# Patient Record
Sex: Female | Born: 1955 | Race: Black or African American | Hispanic: No | Marital: Married | Smoking: Former smoker
Health system: Southern US, Community
[De-identification: ages and names within clinical notes are randomized; demographics above are authoritative.]

## PROBLEM LIST (undated history)

## (undated) DIAGNOSIS — D649 Anemia, unspecified: Secondary | ICD-10-CM

## (undated) DIAGNOSIS — I10 Essential (primary) hypertension: Secondary | ICD-10-CM

## (undated) DIAGNOSIS — M1990 Unspecified osteoarthritis, unspecified site: Secondary | ICD-10-CM

## (undated) DIAGNOSIS — Z923 Personal history of irradiation: Secondary | ICD-10-CM

## (undated) DIAGNOSIS — K219 Gastro-esophageal reflux disease without esophagitis: Secondary | ICD-10-CM

## (undated) DIAGNOSIS — E785 Hyperlipidemia, unspecified: Secondary | ICD-10-CM

## (undated) DIAGNOSIS — C50919 Malignant neoplasm of unspecified site of unspecified female breast: Secondary | ICD-10-CM

## (undated) HISTORY — PX: TUBAL LIGATION: SHX77

## (undated) HISTORY — DX: Essential (primary) hypertension: I10

## (undated) HISTORY — DX: Hyperlipidemia, unspecified: E78.5

## (undated) HISTORY — PX: OTHER SURGICAL HISTORY: SHX169

## (undated) HISTORY — DX: Malignant neoplasm of unspecified site of unspecified female breast: C50.919

---

## 2004-05-08 ENCOUNTER — Emergency Department: Payer: Self-pay | Admitting: Internal Medicine

## 2004-06-20 ENCOUNTER — Emergency Department: Payer: Self-pay | Admitting: Emergency Medicine

## 2005-01-02 ENCOUNTER — Emergency Department: Payer: Self-pay | Admitting: Unknown Physician Specialty

## 2005-01-02 ENCOUNTER — Other Ambulatory Visit: Payer: Self-pay

## 2007-09-16 ENCOUNTER — Ambulatory Visit: Payer: Self-pay

## 2011-05-01 ENCOUNTER — Ambulatory Visit: Payer: Self-pay | Admitting: Internal Medicine

## 2011-10-02 ENCOUNTER — Ambulatory Visit: Payer: Self-pay | Admitting: Family

## 2012-03-04 ENCOUNTER — Ambulatory Visit: Payer: Self-pay | Admitting: Adult Health

## 2012-03-11 ENCOUNTER — Ambulatory Visit: Payer: Self-pay | Admitting: Adult Health

## 2012-05-13 ENCOUNTER — Encounter: Payer: Self-pay | Admitting: Adult Health

## 2012-05-13 ENCOUNTER — Ambulatory Visit (INDEPENDENT_AMBULATORY_CARE_PROVIDER_SITE_OTHER): Payer: 59 | Admitting: Adult Health

## 2012-05-13 VITALS — BP 190/110 | HR 70 | Temp 98.5°F | Ht 70.5 in | Wt 222.0 lb

## 2012-05-13 DIAGNOSIS — Z0000 Encounter for general adult medical examination without abnormal findings: Secondary | ICD-10-CM | POA: Insufficient documentation

## 2012-05-13 DIAGNOSIS — G629 Polyneuropathy, unspecified: Secondary | ICD-10-CM

## 2012-05-13 DIAGNOSIS — G589 Mononeuropathy, unspecified: Secondary | ICD-10-CM

## 2012-05-13 DIAGNOSIS — I10 Essential (primary) hypertension: Secondary | ICD-10-CM | POA: Insufficient documentation

## 2012-05-13 DIAGNOSIS — E785 Hyperlipidemia, unspecified: Secondary | ICD-10-CM | POA: Insufficient documentation

## 2012-05-13 DIAGNOSIS — Z1211 Encounter for screening for malignant neoplasm of colon: Secondary | ICD-10-CM

## 2012-05-13 DIAGNOSIS — R5381 Other malaise: Secondary | ICD-10-CM

## 2012-05-13 DIAGNOSIS — R5383 Other fatigue: Secondary | ICD-10-CM

## 2012-05-13 DIAGNOSIS — Z23 Encounter for immunization: Secondary | ICD-10-CM

## 2012-05-13 NOTE — Assessment & Plan Note (Signed)
Patient has not taken her medication today. Suspect it is elevated 2/2 white coat syndrome and not taking meds. She will monitor this and we will adjust meds as needed.

## 2012-05-13 NOTE — Assessment & Plan Note (Addendum)
Currently on simvastatin 40 mg Will check lipid panel. Patient will return for fasting labs.

## 2012-05-13 NOTE — Patient Instructions (Addendum)
  Thank you for choosing [facility] for your health care needs.  You will get your Tetanus and Shingles vaccine today.  We will call you with your appointment for the screening colonoscopy.  Remember to return on Monday for your fasting labs. Please schedule appointment for this at the front.  Remember to schedule an appointment for your PAP.  You have enough refills on your medications for now. Please let me know when you are down to 2 refills.

## 2012-05-13 NOTE — Progress Notes (Signed)
  Subjective:    Patient ID: Olivia Kirk, female    DOB: 1955/05/21, 57 y.o.   MRN: [id number]  HPI  Patient is a 57 y/o female who is here today to establish care. She has a hx of HTN, HLD. She was previously followed at [facility]. She is feeling quite well today.   Health Maintenance:  Mammogram - 2013 - [facility] (normal)  Pap - greater than 3 years ago.   Tobacco use - Remote 5 year 1/2 ppd hx - quit 25 years ago.   Tdap - Needs - will administer today  Shingles - Needs   Influenza vaccine - Has not had this season.  Colon Cancer Screen - Needs  Diet - Tries to eat healthy. Eats vegetables not enough fruits  Exercise - not currently  Eye exam - Needs will schedule this year.  Dental exam - Upper/lower denture. Has not been to dentist in approximately 2 years.    Review of Systems  Constitutional: Positive for fatigue. Negative for fever, chills and unexpected weight change.  Eyes: Negative.   Respiratory: Negative for cough, chest tightness and wheezing.   Cardiovascular: Positive for palpitations and leg swelling. Negative for chest pain.  Gastrointestinal: Positive for constipation. Negative for nausea, vomiting, abdominal pain, diarrhea, blood in stool and anal bleeding.  Endocrine: Negative.   Genitourinary: Negative for dysuria, urgency, hematuria, flank pain, decreased urine volume, vaginal bleeding, vaginal discharge, difficulty urinating, genital sores, pelvic pain and dyspareunia.  Allergic/Immunologic: Negative for environmental allergies and food allergies.  Neurological: Positive for numbness and headaches. Negative for dizziness, tremors, syncope and light-headedness.       Occasional numbness and tingling in hands and feet.  Hematological: Does not bruise/bleed easily.  Psychiatric/Behavioral: Positive for sleep disturbance. Negative for suicidal ideas and confusion. The patient is nervous/anxious.        Sleep disturbance 2/2 starts to  think about her family, etc. Thoughts just "keep coming".       Objective:   Physical Exam  Constitutional: She is oriented to person, place, and time. She appears well-developed and well-nourished. No distress.  HENT:  Head: Normocephalic and atraumatic.  Right Ear: External ear normal.  Left Ear: External ear normal.  Nose: Nose normal.  Mouth/Throat: Oropharynx is clear and moist. No oropharyngeal exudate.  Eyes: Conjunctivae and EOM are normal. Pupils are equal, round, and reactive to light. Right eye exhibits no discharge. Left eye exhibits no discharge.  Neck: Normal range of motion. Neck supple. No tracheal deviation present. No thyromegaly present.  Cardiovascular: Normal rate, regular rhythm, normal heart sounds and intact distal pulses.  Exam reveals no gallop.   No murmur heard. Pulmonary/Chest: Effort normal and breath sounds normal. No respiratory distress. She has no wheezes. She has no rales.  Abdominal: Soft. Bowel sounds are normal. She exhibits no distension and no mass. There is no tenderness. There is no rebound and no guarding.  Musculoskeletal: Normal range of motion.  Trace bilateral ankle edema  Lymphadenopathy:    She has no cervical adenopathy.  Neurological: She is alert and oriented to person, place, and time. She has normal reflexes. Coordination normal.  Skin: Skin is warm and dry. No rash noted. No erythema. No pallor.  Psychiatric: She has a normal mood and affect. Her behavior is normal. Judgment and thought content normal.       Assessment & Plan:

## 2012-05-13 NOTE — Assessment & Plan Note (Addendum)
Physical exam normal. B/P elevated (see note below). Will check labs: cbc w/diff, cmet, lipids, TSH, B12. Refer to GI for screening colonoscopy. Give Tdap and shingles vaccines today. She wants to hold off on flu vaccine. Pt will return on Monday morning for fasting labs. Needs to schedule appointment for pelvic including PAP. Patient reporting mild fatigue - check TSH. Also reports tingling and numbness in feet and hands - check B12.

## 2012-05-20 ENCOUNTER — Other Ambulatory Visit (INDEPENDENT_AMBULATORY_CARE_PROVIDER_SITE_OTHER): Payer: 59

## 2012-05-20 DIAGNOSIS — G589 Mononeuropathy, unspecified: Secondary | ICD-10-CM

## 2012-05-20 DIAGNOSIS — R5381 Other malaise: Secondary | ICD-10-CM

## 2012-05-20 DIAGNOSIS — Z0000 Encounter for general adult medical examination without abnormal findings: Secondary | ICD-10-CM

## 2012-05-20 DIAGNOSIS — R5383 Other fatigue: Secondary | ICD-10-CM

## 2012-05-20 DIAGNOSIS — G629 Polyneuropathy, unspecified: Secondary | ICD-10-CM

## 2012-05-20 LAB — COMPREHENSIVE METABOLIC PANEL
ALT: 16 U/L (ref 0–35)
Albumin: 4 g/dL (ref 3.5–5.2)
CO2: 29 mEq/L (ref 19–32)
Calcium: 9.7 mg/dL (ref 8.4–10.5)
Chloride: 103 mEq/L (ref 96–112)
GFR: 85.04 mL/min (ref >60.00)
Glucose, Bld: 95 mg/dL (ref 70–99)
Potassium: 3.6 mEq/L (ref 3.5–5.1)
Sodium: 140 mEq/L (ref 135–145)
Total Bilirubin: 0.6 mg/dL (ref 0.3–1.2)
Total Protein: 7.3 g/dL (ref 6.0–8.3)

## 2012-05-20 LAB — CBC WITH DIFFERENTIAL/PLATELET
Basophils Absolute: 0 10*3/uL (ref 0.0–0.1)
Eosinophils Relative: 1.2 % (ref 0.0–5.0)
HCT: 35.5 % — ABNORMAL LOW (ref 36.0–46.0)
Lymphocytes Relative: 40 % (ref 12.0–46.0)
Monocytes Relative: 6.3 % (ref 3.0–12.0)
Platelets: 265 10*3/uL (ref 150.0–400.0)
RDW: 13.4 % (ref 11.5–14.6)
WBC: 6.8 10*3/uL (ref 4.5–10.5)

## 2012-05-20 LAB — VITAMIN B12: Vitamin B-12: 436 pg/mL (ref 211–911)

## 2012-05-20 LAB — LDL CHOLESTEROL, DIRECT: Direct LDL: 106.2 mg/dL

## 2012-05-20 LAB — LIPID PANEL
Cholesterol: 171 mg/dL (ref 0–200)
VLDL: 25.4 mg/dL (ref 0.0–40.0)

## 2012-05-20 LAB — TSH: TSH: 0.74 u[IU]/mL (ref 0.35–5.50)

## 2012-05-22 ENCOUNTER — Other Ambulatory Visit: Payer: Self-pay | Admitting: Adult Health

## 2012-05-22 DIAGNOSIS — D649 Anemia, unspecified: Secondary | ICD-10-CM

## 2012-05-27 ENCOUNTER — Telehealth: Payer: Self-pay | Admitting: Adult Health

## 2012-05-27 NOTE — Telephone Encounter (Signed)
Megan calling from Dr. Earnest Conroy office regarding colonoscopy referral from [facility].  Per Dr. Markham Jordan pt needs EKG.  Pt has appt 3/31 with [facility]; can EKG be added to that visit or need to schedule separate appt for EKG.  Need EKG faxed to Dr. Mechele Collin fax [phone] ATTN:  Aundra Millet when complete.

## 2012-05-27 NOTE — Telephone Encounter (Signed)
FYI only Raquel

## 2012-05-31 ENCOUNTER — Encounter: Payer: Self-pay | Admitting: *Deleted

## 2012-06-03 LAB — HM COLONOSCOPY: HM Colonoscopy: NORMAL

## 2012-06-17 ENCOUNTER — Other Ambulatory Visit (HOSPITAL_COMMUNITY)
Admission: RE | Admit: 2012-06-17 | Discharge: 2012-06-17 | Disposition: A | Discharge status: Home / Self Care | Payer: 59 | Source: Ambulatory Visit | Attending: Adult Health | Admitting: Adult Health

## 2012-06-17 ENCOUNTER — Ambulatory Visit (INDEPENDENT_AMBULATORY_CARE_PROVIDER_SITE_OTHER): Payer: 59 | Admitting: Adult Health

## 2012-06-17 ENCOUNTER — Encounter: Payer: Self-pay | Admitting: Adult Health

## 2012-06-17 VITALS — BP 132/88 | HR 67 | Temp 98.0°F | Resp 14 | Ht 71.5 in | Wt 220.0 lb

## 2012-06-17 DIAGNOSIS — Z01419 Encounter for gynecological examination (general) (routine) without abnormal findings: Secondary | ICD-10-CM | POA: Insufficient documentation

## 2012-06-17 DIAGNOSIS — Z1151 Encounter for screening for human papillomavirus (HPV): Secondary | ICD-10-CM | POA: Insufficient documentation

## 2012-06-17 DIAGNOSIS — Z1211 Encounter for screening for malignant neoplasm of colon: Secondary | ICD-10-CM | POA: Insufficient documentation

## 2012-06-17 DIAGNOSIS — R9431 Abnormal electrocardiogram [ECG] [EKG]: Secondary | ICD-10-CM

## 2012-06-17 DIAGNOSIS — I10 Essential (primary) hypertension: Secondary | ICD-10-CM

## 2012-06-17 DIAGNOSIS — Z124 Encounter for screening for malignant neoplasm of cervix: Secondary | ICD-10-CM

## 2012-06-17 DIAGNOSIS — Z1239 Encounter for other screening for malignant neoplasm of breast: Secondary | ICD-10-CM

## 2012-06-17 DIAGNOSIS — Z0000 Encounter for general adult medical examination without abnormal findings: Secondary | ICD-10-CM

## 2012-06-17 NOTE — Patient Instructions (Addendum)
  Your appointment with Dr. Mechele Collin for the screening colonoscopy is 08/19/12 at 11:00 am at [facility]  I am referring you to cardiology to evaluate the EKG.

## 2012-06-17 NOTE — Progress Notes (Signed)
  Subjective:    Patient ID: Olivia Kirk, female    DOB: [birth year], 57 y.o.   MRN: [id number]  HPI  Patient is a pleasant 57 year old female with hx of HTN, HLD who presents to clinic for her pelvic exam including Pap, breast exam and EKG prior to colonoscopy. She is also here for followup hypertension. Patient is feeling well overall.   Current Outpatient Prescriptions on File Prior to Visit  Medication Sig Dispense Refill  . hydrochlorothiazide (HYDRODIURIL) 25 MG tablet Take 25 mg by mouth daily.      . metoprolol (LOPRESSOR) 50 MG tablet Take 50 mg by mouth 2 (two) times daily.      . metoprolol succinate (TOPROL-XL) 100 MG 24 hr tablet Take 100 mg by mouth daily. Take with or immediately following a meal.      . simvastatin (ZOCOR) 40 MG tablet Take 40 mg by mouth every evening.       No current facility-administered medications on file prior to visit.    Review of Systems  Constitutional: Negative for fever, chills and fatigue.  Respiratory: Negative.   Cardiovascular: Negative.   Gastrointestinal: Negative.   Genitourinary: Negative for vaginal bleeding, vaginal discharge, difficulty urinating and pelvic pain.  Musculoskeletal: Negative.   Neurological: Negative for dizziness, syncope, weakness, numbness and headaches.  Psychiatric/Behavioral: Negative.    BP 132/88  Pulse 67  Temp(Src) 98 F (36.7 C) (Oral)  Resp 14  Ht 5' 11.5" (1.816 m)  Wt 220 lb (99.791 kg)  BMI 30.26 kg/m2  SpO2 95%     Objective:   Physical Exam  Constitutional: She is oriented to person, place, and time. She appears well-developed and well-nourished. No distress.  Cardiovascular: Normal rate, regular rhythm and normal heart sounds.  Exam reveals no gallop.   No murmur heard. Pulmonary/Chest: Effort normal and breath sounds normal. No respiratory distress.  Abdominal: Soft. Bowel sounds are normal. She exhibits no mass. There is no tenderness.  Genitourinary: Vagina normal and uterus  normal. Guaiac negative stool. No breast swelling, tenderness, discharge or bleeding. No labial fusion. There is no rash, tenderness, lesion or injury on the right labia. There is no rash, tenderness, lesion or injury on the left labia. No erythema, tenderness or bleeding around the vagina. No vaginal discharge found.  Musculoskeletal: Normal range of motion. She exhibits no edema.  Neurological: She is alert and oriented to person, place, and time.  Skin: Skin is warm and dry.  Psychiatric: She has a normal mood and affect. Her behavior is normal. Judgment and thought content normal.       Assessment & Plan:

## 2012-06-17 NOTE — Assessment & Plan Note (Signed)
Normal breast tissue. No hard immovable masses palpated. No nipple drainage or dimpling. Patient will have yearly mammography at [facility]

## 2012-06-17 NOTE — Assessment & Plan Note (Signed)
Patient has been taking her medication and is better controlled than during last visit. She denies any problems with side effects of her medications.

## 2012-06-17 NOTE — Assessment & Plan Note (Signed)
Pap smear and pelvic exam completed. Normal physical exam. The ovaries are nonpalpable. No adnexal masses. No cystocele or rectocele appreciated. Hemoccult negative.

## 2012-06-17 NOTE — Addendum Note (Signed)
Addended by: Montine Circle D on: 06/17/2012 04:14 PM   Modules accepted: Orders

## 2012-06-20 ENCOUNTER — Ambulatory Visit (INDEPENDENT_AMBULATORY_CARE_PROVIDER_SITE_OTHER): Payer: 59 | Admitting: Cardiovascular Disease

## 2012-06-20 ENCOUNTER — Encounter: Payer: Self-pay | Admitting: Cardiovascular Disease

## 2012-06-20 VITALS — BP 140/90 | HR 59 | Ht 72.0 in | Wt 221.8 lb

## 2012-06-20 DIAGNOSIS — R9431 Abnormal electrocardiogram [ECG] [EKG]: Secondary | ICD-10-CM | POA: Insufficient documentation

## 2012-06-20 DIAGNOSIS — R0789 Other chest pain: Secondary | ICD-10-CM

## 2012-06-20 DIAGNOSIS — R0602 Shortness of breath: Secondary | ICD-10-CM

## 2012-06-20 NOTE — Progress Notes (Signed)
HPI  This is a pleasant 57 year old [place] American female who was referred for evaluation of an abnormal ECG. She is not aware of any previous cardiac history. She has known history of hypertension and hyperlipidemia. Otherwise she's been healthy. She denies any exertional chest pain. She has occasional mild palpitations and dyspnea but not on a regular basis. She has no limitation with physical activities. She also complains of mild lower extremity edema worse on the left side. She reports having a stress test many years ago for atypical symptoms and she was told that everything was fine. She is not a smoker and has no family history of premature coronary artery disease. There is family history of hypertension.  Allergies  Allergen Reactions  . Guaifenesin & Derivatives Itching, Dermatitis and Rash     Current Outpatient Prescriptions on File Prior to Visit  Medication Sig Dispense Refill  . hydrochlorothiazide (HYDRODIURIL) 25 MG tablet Take 25 mg by mouth daily.      . metoprolol (LOPRESSOR) 50 MG tablet Take 50 mg by mouth 2 (two) times daily.      . metoprolol succinate (TOPROL-XL) 100 MG 24 hr tablet Take 100 mg by mouth daily. Take with or immediately following a meal.      . simvastatin (ZOCOR) 40 MG tablet Take 40 mg by mouth every evening.       No current facility-administered medications on file prior to visit.     Past Medical History  Diagnosis Date  . Hypertension   . Hyperlipidemia      Past Surgical History  Procedure Laterality Date  . Tubal ligation    . Hemrrhoid       Family History  Problem Relation Age of Onset  . Hypertension Mother   . Cancer Father   . Heart disease Father   . Hypertension Maternal Grandmother   . Stroke Maternal Grandmother      History   Social History  . Marital Status: Married    Spouse Name: Moise Boring [place]    Number of Children: 3  . Years of Education: 12   Occupational History  . [employer] research associate in [place]  . Cleaning services     [employer]   Social History Main Topics  . Smoking status: Former Smoker -- 0.25 packs/day    Types: Cigarettes    Quit date: 05/07/1987  . Smokeless tobacco: Never Used  . Alcohol Use: 0.0 oz/week    0 Glasses of wine per week     Comment: Occassionally  . Drug Use: No  . Sexually Active: Not on file   Other Topics Concern  . Not on file   Social History Narrative      Patient lives at home with her husband of 33 years. They have 3 adult children. Recently, one of their daughters moved in with them with her 3 small children. She has a remote tobacco hx. She quite approximately 25 years ago. She has two jobs - she works for [employer] in [place] and also for [employer].     ROS Constitutional: Negative for fever, chills, diaphoresis, activity change, appetite change and fatigue.  HENT: Negative for hearing loss, nosebleeds, congestion, sore throat, facial swelling, drooling, trouble swallowing, neck pain, voice change, sinus pressure and tinnitus.  Eyes: Negative for photophobia, pain, discharge and visual disturbance.  Respiratory: Negative for apnea, cough, shortness of breath and wheezing.  Cardiovascular: Negative for chest pain. Gastrointestinal: Negative for nausea, vomiting, abdominal  pain, diarrhea, constipation, blood in stool and abdominal distention.  Genitourinary: Negative for dysuria, urgency, frequency, hematuria and decreased urine volume.  Musculoskeletal: Negative for myalgias, back pain, joint swelling, arthralgias and gait problem.  Skin: Negative for color change, pallor, rash and wound.  Neurological: Negative for dizziness, tremors, seizures, syncope, speech difficulty, weakness, light-headedness, numbness and headaches.  Psychiatric/Behavioral: Negative for suicidal ideas, hallucinations, behavioral problems and agitation. The patient is not nervous/anxious.     PHYSICAL EXAM   BP 140/90   Pulse 59  Ht 6' (1.829 m)  Wt 221 lb 12 oz (100.585 kg)  BMI 30.07 kg/m2 Constitutional: She is oriented to person, place, and time. She appears well-developed and well-nourished. No distress.  HENT: No nasal discharge.  Head: Normocephalic and atraumatic.  Eyes: Pupils are equal and round. Right eye exhibits no discharge. Left eye exhibits no discharge.  Neck: Normal range of motion. Neck supple. No JVD present. No thyromegaly present.  Cardiovascular: Normal rate, regular rhythm, normal heart sounds. Exam reveals no gallop and no friction rub. No murmur heard.  Pulmonary/Chest: Effort normal and breath sounds normal. No stridor. No respiratory distress. She has no wheezes. She has no rales. She exhibits no tenderness.  Abdominal: Soft. Bowel sounds are normal. She exhibits no distension. There is no tenderness. There is no rebound and no guarding.  Musculoskeletal: Normal range of motion. She exhibits no edema and no tenderness.  Neurological: She is alert and oriented to person, place, and time. Coordination normal.  Skin: Skin is warm and dry. No rash noted. She is not diaphoretic. No erythema. No pallor.  Psychiatric: She has a normal mood and affect. Her behavior is normal. Judgment and thought content normal.     ZOX:[id number]  Bradycardia  Nonspecific IVCD. QRS duration: 102 ms Borderline ECG   ASSESSMENT AND PLAN

## 2012-06-20 NOTE — Patient Instructions (Addendum)
Your physician has requested that you have an echocardiogram. Echocardiography is a painless test that uses sound waves to create images of your heart. It provides your doctor with information about the size and shape of your heart and how well your heart's chambers and valves are working. This procedure takes approximately one hour. There are no restrictions for this procedure.  Follow up as needed 

## 2012-06-20 NOTE — Assessment & Plan Note (Signed)
There is borderline nonspecific IVCD on the EKG which I suspect is likely due to mild hypertensive heart disease. I don't see any signs of ischemia or other cardiac pathology. She has no chest pain but does complain of occasional shortness of breath and palpitations. Thus, I will obtain an echocardiogram for evaluation. Overall, she was reassured. I  recommend controlling her risk factors including hypertension and hyperlipidemia.

## 2012-06-26 ENCOUNTER — Encounter: Payer: Self-pay | Admitting: General Practice

## 2012-07-11 ENCOUNTER — Other Ambulatory Visit (INDEPENDENT_AMBULATORY_CARE_PROVIDER_SITE_OTHER): Payer: 59

## 2012-07-11 ENCOUNTER — Other Ambulatory Visit: Payer: Self-pay

## 2012-07-11 DIAGNOSIS — R9431 Abnormal electrocardiogram [ECG] [EKG]: Secondary | ICD-10-CM

## 2012-07-11 DIAGNOSIS — R0789 Other chest pain: Secondary | ICD-10-CM

## 2012-07-11 DIAGNOSIS — R0602 Shortness of breath: Secondary | ICD-10-CM

## 2012-07-15 NOTE — Progress Notes (Signed)
Pt informed of echo results.

## 2012-10-02 ENCOUNTER — Ambulatory Visit: Payer: Self-pay | Admitting: Internal Medicine

## 2012-10-24 ENCOUNTER — Encounter: Payer: Self-pay | Admitting: Internal Medicine

## 2013-03-31 ENCOUNTER — Telehealth: Payer: Self-pay | Admitting: *Deleted

## 2013-03-31 NOTE — Telephone Encounter (Signed)
Patient walked in c/o numbness in both arms yesterday while she was in [employer]. Denies any chest pain or discomfort, no shortness of breath or any other symptoms. It only happened on this one occasion in church yesterday and has not happened again. Informed patient to schedule an appointment to be seen. Also stated she is having more knee pain, the pain is more constant and feels like a sharp stabbing right there where she has a broken vein. If any other suggestions please call patient.

## 2013-04-01 ENCOUNTER — Encounter: Payer: Self-pay | Admitting: Adult Health

## 2013-04-01 ENCOUNTER — Ambulatory Visit (INDEPENDENT_AMBULATORY_CARE_PROVIDER_SITE_OTHER): Payer: 59 | Admitting: Adult Health

## 2013-04-01 VITALS — BP 140/86 | HR 59 | Temp 98.2°F | Resp 12 | Wt 206.0 lb

## 2013-04-01 DIAGNOSIS — I8390 Asymptomatic varicose veins of unspecified lower extremity: Secondary | ICD-10-CM | POA: Insufficient documentation

## 2013-04-01 DIAGNOSIS — E538 Deficiency of other specified B group vitamins: Secondary | ICD-10-CM

## 2013-04-01 DIAGNOSIS — E162 Hypoglycemia, unspecified: Secondary | ICD-10-CM

## 2013-04-01 DIAGNOSIS — R200 Anesthesia of skin: Secondary | ICD-10-CM

## 2013-04-01 DIAGNOSIS — D649 Anemia, unspecified: Secondary | ICD-10-CM

## 2013-04-01 DIAGNOSIS — R202 Paresthesia of skin: Secondary | ICD-10-CM

## 2013-04-01 DIAGNOSIS — R209 Unspecified disturbances of skin sensation: Secondary | ICD-10-CM

## 2013-04-01 LAB — HEMOGLOBIN A1C: Hgb A1c MFr Bld: 6.4 % (ref 4.6–6.5)

## 2013-04-01 LAB — FERRITIN: Ferritin: 62.8 ng/mL (ref 10.0–291.0)

## 2013-04-01 LAB — IRON AND TIBC
%SAT: 24 % (ref 20–55)
Iron: 73 ug/dL (ref 42–145)
TIBC: 310 ug/dL (ref 250–470)
UIBC: 237 ug/dL (ref 125–400)

## 2013-04-01 NOTE — Progress Notes (Signed)
Pre visit review using our clinic review tool, if applicable. No additional management support is needed unless otherwise documented below in the visit note. 

## 2013-04-01 NOTE — Patient Instructions (Signed)
  Please have your labs drawn prior to leaving the office.  Make sure that you do not skip any meals. Eat breakfast prior to going to [place] on Sunday. Take a snack with you that you can easily access if you are there for a long time.  I am referring you to [facility] for your painful varicose veins. I suggest you use compression hose for work since you are on your feet all day long.  Once the results of your labs are available we will contact you.

## 2013-04-01 NOTE — Assessment & Plan Note (Signed)
One time event lasting approximately 15 min while at church. Had not had any breakfast. Also felt lightheaded. Symptoms subsided after she ate. Suspect hypoglycemia. Check HgbA1c. Also check methylmalonic acid (recent B12 low normal), iron, ferritin.

## 2013-04-01 NOTE — Assessment & Plan Note (Signed)
Painful varicose veins bilateral lower extremities. Works on her feet all day then has a second job where she is also on her feet. Recommend compression stocking while working. Refer to [facility] for evaluation and further recommendations.

## 2013-04-01 NOTE — Progress Notes (Signed)
   Subjective:    Patient ID: Olivia Kirk, female    DOB: [birth year], 58 y.o.   MRN: [id number]  HPI  Pt is a pleasant 57 y/o female who presents to clinic with concerns that while she was at church on Sunday she experienced numbness and tingling of both arms. 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 She reports the incident last ~ 15 minutes. Denies chest pain, shortness of breath. She felt lightheaded. She reports that she had not had any breakfast.  Pain in varicose veins on the left lower extremity. Works on her feet all day. She is not using any compression stockings during the day. There is one area that is more painful. The is around her knee area.  Review of Systems  Constitutional: Negative.   Respiratory: Negative.   Cardiovascular:       Painful varicose veins on the left leg  Gastrointestinal: Negative.   Genitourinary: Negative.   Musculoskeletal:       Painful legs especially at the end of the day  Neurological:       Numbness and tingling incident lasting 15 min  Hematological: Negative.   Psychiatric/Behavioral: Negative.   All other systems reviewed and are negative.       Objective:   Physical Exam  Constitutional: She is oriented to person, place, and time. She appears well-developed and well-nourished. No distress.  Cardiovascular: Normal rate, regular rhythm and normal heart sounds.  Exam reveals no gallop and no friction rub.   No murmur heard. Pulmonary/Chest: Effort normal and breath sounds normal. No respiratory distress. She has no wheezes. She has no rales.  Musculoskeletal: Normal range of motion. She exhibits no edema.  Neurological: She is alert and oriented to person, place, and time.  Skin: Skin is warm and dry.  Psychiatric: She has a normal mood and affect. Her behavior is normal. Judgment and thought content normal.          Assessment & Plan:

## 2013-04-03 LAB — METHYLMALONIC ACID, SERUM: METHYLMALONIC ACID, QUANT: 0.2 umol/L (ref <0.40)

## 2013-04-16 ENCOUNTER — Other Ambulatory Visit: Payer: Self-pay | Admitting: *Deleted

## 2013-04-16 MED ORDER — HYDROCHLOROTHIAZIDE 25 MG PO TABS: 25.0000 mg | ORAL_TABLET | Freq: Every day | ORAL | Status: DC

## 2013-04-16 MED ORDER — SIMVASTATIN 40 MG PO TABS: 40.0000 mg | ORAL_TABLET | Freq: Every evening | ORAL | Status: DC

## 2013-04-16 MED ORDER — METOPROLOL SUCCINATE ER 100 MG PO TB24: 100.0000 mg | ORAL_TABLET | Freq: Every day | ORAL | Status: DC

## 2013-05-22 ENCOUNTER — Encounter: Payer: Self-pay | Admitting: Internal Medicine

## 2013-05-22 ENCOUNTER — Encounter: Payer: Self-pay | Admitting: *Deleted

## 2013-05-22 ENCOUNTER — Ambulatory Visit (INDEPENDENT_AMBULATORY_CARE_PROVIDER_SITE_OTHER): Payer: 59 | Admitting: Internal Medicine

## 2013-05-22 VITALS — BP 154/98 | HR 82 | Temp 98.5°F | Resp 18 | Wt 220.0 lb

## 2013-05-22 DIAGNOSIS — IMO0001 Reserved for inherently not codable concepts without codable children: Secondary | ICD-10-CM

## 2013-05-22 DIAGNOSIS — J020 Streptococcal pharyngitis: Secondary | ICD-10-CM

## 2013-05-22 DIAGNOSIS — A389 Scarlet fever, uncomplicated: Secondary | ICD-10-CM

## 2013-05-22 LAB — POCT RAPID STREP A (OFFICE): Rapid Strep A Screen: POSITIVE — AB

## 2013-05-22 MED ORDER — AMOXICILLIN-POT CLAVULANATE 875-125 MG PO TABS: 1.0000 | ORAL_TABLET | Freq: Two times a day (BID) | ORAL | Status: DC

## 2013-05-22 MED ORDER — HYDROCOD POLST-CHLORPHEN POLST 10-8 MG/5ML PO LQCR: 5.0000 mL | Freq: Two times a day (BID) | ORAL | Status: DC | PRN

## 2013-05-22 NOTE — Progress Notes (Signed)
Patient ID: Olivia Kirk, female   DOB: 05/27/1955, 58 y.o.   MRN: [id number]   Patient Active Problem List   Diagnosis Date Noted  . Strep throat/scarlet fever 05/24/2013  . Varicose vein of leg 04/01/2013  . Numbness and tingling 04/01/2013  . Abnormal EKG 06/20/2012  . Screening for cervical cancer 06/17/2012  . Screening breast examination 06/17/2012  . HTN (hypertension) 05/13/2012  . HLD (hyperlipidemia) 05/13/2012  . General medical exam 05/13/2012    Subjective:  CC:   Chief Complaint  Patient presents with  . Acute Visit    Pain in right side when patient cough's   . Cough    non productive  . Sore Throat    Scratchy throat 05/15/13    HPI:   Olivia Kirk is a 58 y.o. female who presents for Upper respiratory infection and rash,  She has had malaise for 6 days started  last Friday,.  grandson has also had a febrile illness. Scratchy throat. Taking lozenges.  lost voice on Tuesday.  Had a fever saturday night ,  Feeling weak for several days.  Coughing a lot , nonproductive day and night .  Right side started hurting last night .  nOticed sudden onset of Neck rash  Since Saturday .    Past Medical History  Diagnosis Date  . Hypertension   . Hyperlipidemia     Past Surgical History  Procedure Laterality Date  . Tubal ligation    . Hemrrhoid         The following portions of the patient's history were reviewed and updated as appropriate: Allergies, current medications, and problem list.    Review of Systems:   Patient denies headache, fevers, malaise, unintentional weight loss, skin rash, eye pain, sinus congestion and sinus pain, sore throat, dysphagia,  hemoptysis , cough, dyspnea, wheezing, chest pain, palpitations, orthopnea, edema, abdominal pain, nausea, melena, diarrhea, constipation, flank pain, dysuria, hematuria, urinary  Frequency, nocturia, numbness, tingling, seizures,  Focal weakness, Loss of consciousness,  Tremor, insomnia, depression,  anxiety, and suicidal ideation.     History   Social History  . Marital Status: Married    Spouse Name: [name]    Number of Children: 3  . Years of Education: 12   Occupational History  . 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 Smoking status: Former Smoker -- 0.25 packs/day    Types: Cigarettes    Quit date: 05/07/1987  . Smokeless tobacco: Never Used  . Alcohol Use: 0.0 oz/week    0 Glasses of wine per week     Comment: Occassionally  . Drug Use: No  . Sexual Activity: Not on file   Other Topics Concern  . Not on file   Social History Narrative      Patient lives at home with her husband of 16 years. They have 3 adult children. Recently, one of their daughters moved in with them with her 3 small children. She has a remote tobacco hx. She quite approximately 25 years ago. She has two jobs - she works for [employer] in [place] [employer] and also for [employer].    Objective:  Filed Vitals:   05/22/13 0905  BP: 154/98  Pulse: 82  Temp: 98.5 F (36.9 C)  Resp: 18     General appearance: alert, cooperative and appears stated age Ears: normal TM's and external ear canals both  ears Throat: bilateral tonsillar erythema.  Neck: bilateral cervical no adenopathy, Neck rash papular diffuse noted  Back: symmetric, no curvature. ROM normal. No CVA tenderness. Lungs: clear to auscultation bilaterally Heart: regular rate and rhythm, S1, S2 normal, no murmur, click, rub or gallop Abdomen: soft, non-tender; bowel sounds normal; no masses,  no organomegaly Pulses: 2+ and symmetric Skin: Skin color, texture, turgor normal. No rashes or lesions Lymph nodes: Cervical, supraclavicular, and axillary nodes normal.  Assessment and Plan:  Strep throat/scarlet fever Augmentin prescribed,  Advil/tylenol for aches pains and fevers. Tussionex for cough and sore throat   Updated Medication  List Outpatient Encounter Prescriptions as of 05/22/2013  Medication Sig  . hydrochlorothiazide (HYDRODIURIL) 25 MG tablet Take 1 tablet (25 mg total) by mouth daily.  . metoprolol succinate (TOPROL-XL) 100 MG 24 hr tablet Take 1 tablet (100 mg total) by mouth daily. Take with or immediately following a meal.  . Multiple Vitamins-Minerals (MULTIVITAMIN WITH MINERALS) tablet Take 1 tablet by mouth daily.  . simvastatin (ZOCOR) 40 MG tablet Take 1 tablet (40 mg total) by mouth every evening.  Marland Kitchen amoxicillin-clavulanate (AUGMENTIN) 875-125 MG per tablet Take 1 tablet by mouth 2 (two) times daily.  . chlorpheniramine-HYDROcodone (TUSSIONEX PENNKINETIC ER) 10-8 MG/5ML LQCR Take 5 mLs by mouth every 12 (twelve) hours as needed for cough.     Orders Placed This Encounter  Procedures  . POCT rapid strep A    No Follow-up on file.

## 2013-05-22 NOTE — Progress Notes (Signed)
Pre-visit discussion using our clinic review tool. No additional management support is needed unless otherwise documented below in the visit note.  

## 2013-05-22 NOTE — Patient Instructions (Signed)
You have Strep Throat and scarlet fever  .  The rash is associated with the infection .  It is very contagious,  I am prescribing augmentin ,  An antibiotic to take twice daily with food for 10 days.  Tussionex is a cough medication that you can take once or twice daily.  You can use tylenol or motrin for the achiness and the fevers.  Please take a probiotic ( Align, Florajen, Culturelle or a generic ) for 2 weeksto prevent a serious antibiotic associated diarrhea  Called" clostridium dificile colitis" ( should also help prevent   vaginal yeast infection)  .   Scarlet Fever Scarlet fever is an infectious disease that can develop with a strep throat. It usually occurs in school-age children and can spread from person to person (contagious). Scarlet fever seldom causes any long-term problems.  CAUSES Scarlet fever is caused by the bacteria (Streptococcus pyogenes).  SYMPTOMS  Sore throat, fever, and headache.  Mild abdominal pain.  Tongue may become red (strawberry tongue).  Red rash that starts 1 to 2 days after fever begins. Rash starts on face and spreads to rest of body.  Rash looks and feels like "goose bumps" or sandpaper and may itch.  Rash lasts 3 to 7 days and then starts to peel. Peeling may last 2 weeks. DIAGNOSIS Scarlet fever typically is diagnosed by physical exam and throat culture.Rapid strep testing is often available. TREATMENT Antibiotic medicine will be prescribed. It usually takes 24 to 48 hours after beginning antibiotics to start feeling better.  HOME CARE INSTRUCTIONS  Rest and get plenty of sleep.  Take your antibiotics as directed. Finish them even if you start to feel better.  Gargle a mixture of 1 tsp of salt and 8 oz of water to soothe the throat.  Drink enough fluids to keep your urine clear or pale yellow.  While the throat is very sore, eat soft or liquid foods such as milk, milk shakes, ice cream, frozen yogurts, soups, or instant breakfast milk  drinks. Cold sport drinks, smoothies, or frozen ice pops are good choices for hydrating.  Family members who develop a sore throat or fever should see a caregiver.  Only take over-the-counter or prescription medicines for pain, discomfort, or fever as directed by your caregiver. Do not use aspirin.  Follow up with your caregiver about test results if necessary. SEEK MEDICAL CARE IF:  There is no improvement even after 48 to 72 hours of treatment or the symptoms worsen.  There is green, yellow-brown, or bloody phlegm.  There is joint pain or leg swelling.  Paleness, weakness, and fast breathing develop.  There is dry mouth, no urination, or sunken eyes (dehydration).  There is dark brown or bloody urine. SEEK IMMEDIATE MEDICAL CARE IF:  There is drooling or swallowing problems.  There are breathing problems.  There is a voice change.  There is neck pain. MAKE SURE YOU:   Understand these instructions.  Will watch your condition.  Will get help right away if you are not doing well or get worse. Document Released: 03/03/2000 Document Revised: 05/29/2011 Document Reviewed: 08/28/2010 [facility] Patient Information 2014 [place], [place].

## 2013-05-24 DIAGNOSIS — IMO0001 Reserved for inherently not codable concepts without codable children: Secondary | ICD-10-CM | POA: Insufficient documentation

## 2013-05-24 NOTE — Assessment & Plan Note (Addendum)
Augmentin prescribed,  Advil/tylenol for aches pains and fevers. Tussionex for cough and sore throat

## 2013-08-17 ENCOUNTER — Other Ambulatory Visit: Payer: Self-pay | Admitting: Adult Health

## 2013-09-17 ENCOUNTER — Other Ambulatory Visit: Payer: Self-pay | Admitting: Adult Health

## 2013-10-27 ENCOUNTER — Other Ambulatory Visit: Payer: Self-pay | Admitting: Adult Health

## 2013-10-27 ENCOUNTER — Telehealth: Payer: Self-pay | Admitting: Adult Health

## 2013-10-27 MED ORDER — HYDROCHLOROTHIAZIDE 25 MG PO TABS: 25.0000 mg | ORAL_TABLET | Freq: Every day | ORAL | Status: DC

## 2013-10-27 MED ORDER — METOPROLOL SUCCINATE ER 100 MG PO TB24: 100.0000 mg | ORAL_TABLET | Freq: Every day | ORAL | Status: DC

## 2013-10-27 MED ORDER — SIMVASTATIN 40 MG PO TABS: 40.0000 mg | ORAL_TABLET | Freq: Every day | ORAL | Status: DC

## 2013-10-27 NOTE — Telephone Encounter (Signed)
Please call pt and let her know she needs appt to follow up on the meds. I will give her a 30 day supply to give her a change to get to clinic

## 2013-10-27 NOTE — Telephone Encounter (Signed)
Patient called requesting refills on HCTZ, Simvastatin, and Metoprolol patient has not had labs since 3/14 no CMET or Lipid. Last OV with Primary was 06/07/12 please advise Ok to fill?

## 2013-10-27 NOTE — Telephone Encounter (Signed)
Patient notified she needs an appointment and stated she will call office and schedule.

## 2013-12-04 ENCOUNTER — Ambulatory Visit (INDEPENDENT_AMBULATORY_CARE_PROVIDER_SITE_OTHER): Payer: 59 | Admitting: Adult Health

## 2013-12-04 ENCOUNTER — Encounter: Payer: Self-pay | Admitting: Adult Health

## 2013-12-04 VITALS — BP 165/96 | HR 76 | Temp 98.3°F | Resp 14 | Ht 71.5 in | Wt 227.8 lb

## 2013-12-04 DIAGNOSIS — Z23 Encounter for immunization: Secondary | ICD-10-CM

## 2013-12-04 DIAGNOSIS — Z0000 Encounter for general adult medical examination without abnormal findings: Secondary | ICD-10-CM

## 2013-12-04 MED ORDER — METOPROLOL SUCCINATE ER 100 MG PO TB24: 100.0000 mg | ORAL_TABLET | Freq: Every day | ORAL | Status: DC

## 2013-12-04 MED ORDER — SIMVASTATIN 40 MG PO TABS: 40.0000 mg | ORAL_TABLET | Freq: Every day | ORAL | Status: DC

## 2013-12-04 MED ORDER — HYDROCHLOROTHIAZIDE 25 MG PO TABS: 25.0000 mg | ORAL_TABLET | Freq: Every day | ORAL | Status: DC

## 2013-12-04 NOTE — Progress Notes (Signed)
Pre visit review using our clinic review tool, if applicable. No additional management support is needed unless otherwise documented below in the visit note. 

## 2013-12-04 NOTE — Progress Notes (Signed)
Patient ID: Olivia Kirk, female   DOB: Sep 19, 1955, 58 y.o.   MRN: [id number]   Subjective:    Patient ID: Olivia Kirk, female    DOB: 06/22/1955, 58 y.o.   MRN: [id number]  HPI  Pt is a pleasant 58 y/o female who presents to clinic for her annual physical exam and labs. She is feeling well. No concerns. She is up to date on her colonoscopy which was done in 2014 and was normal. Next due in 9 years. She will receive her flu vaccine today.    Past Medical History  Diagnosis Date  . Hypertension   . Hyperlipidemia      Past Surgical History  Procedure Laterality Date  . Tubal ligation    . Hemrrhoid       Family History  Problem Relation Age of Onset  . Hypertension Mother   . Cancer Father   . Heart disease Father   . Hypertension Maternal Grandmother   . Stroke Maternal Grandmother      History   Social History  . Marital Status: Married    Spouse Name: [name]    Number of Children: 3  . Years of Education: 12   Occupational History  . 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 Smoking status: Former Smoker -- 0.25 packs/day    Types: Cigarettes    Quit date: 05/07/1987  . Smokeless tobacco: Never Used  . Alcohol Use: 0.0 oz/week    0 Glasses of wine per week     Comment: Occassionally  . Drug Use: No  . Sexual Activity: Not on file   Other Topics Concern  . Not on file   Social History Narrative      Patient lives at home with her husband of 76 years. They have 3 adult children. Recently, one of their daughters moved in with them with her 3 small children. She has a remote tobacco hx. She quite approximately 25 years ago. She has two jobs - she works for [employer] in [employer] and also for [employer].    Current Outpatient Prescriptions on File Prior to Visit  Medication Sig Dispense Refill  . Multiple Vitamins-Minerals (MULTIVITAMIN WITH MINERALS)  tablet Take 1 tablet by mouth daily.       No current facility-administered medications on file prior to visit.     Review of Systems  Constitutional: Negative.   HENT: Negative.   Eyes: Negative.   Respiratory: Negative.   Cardiovascular: Negative.   Gastrointestinal: Negative.   Endocrine: Negative.   Genitourinary: Negative.   Musculoskeletal: Negative.   Skin: Negative.   Allergic/Immunologic: Negative.   Neurological: Negative.   Hematological: Negative.   Psychiatric/Behavioral: Negative.        Objective:  BP 165/96  Pulse 76  Temp(Src) 98.3 F (36.8 C) (Oral)  Resp 14  Ht 5' 11.5" (1.816 m)  Wt 227 lb 12 oz (103.307 kg)  BMI 31.33 kg/m2  SpO2 99%   Physical Exam  Constitutional: She is oriented to person, place, and time. She appears well-developed and well-nourished. No distress.  HENT:  Head: Normocephalic and atraumatic.  Right Ear: External ear normal.  Left Ear: External ear normal.  Nose: Nose normal.  Mouth/Throat: Oropharynx is clear and moist.  Eyes: Conjunctivae and EOM are normal. Pupils are equal, round, and reactive to light.  Neck: Normal range of motion.  Neck supple. No tracheal deviation present. No thyromegaly present.  Cardiovascular: Normal rate, regular rhythm, normal heart sounds and intact distal pulses.  Exam reveals no gallop and no friction rub.   No murmur heard. Pulmonary/Chest: Effort normal and breath sounds normal. No respiratory distress. She has no wheezes. She has no rales. Right breast exhibits no inverted nipple, no mass, no nipple discharge, no skin change and no tenderness. Left breast exhibits no inverted nipple, no mass, no nipple discharge, no skin change and no tenderness. Breasts are symmetrical.  Abdominal: Soft. Bowel sounds are normal. She exhibits no distension and no mass. There is no tenderness. There is no rebound and no guarding.  Musculoskeletal: Normal range of motion. She exhibits no edema and no tenderness.   Lymphadenopathy:    She has no cervical adenopathy.  Neurological: She is alert and oriented to person, place, and time. She has normal reflexes. No cranial nerve deficit. Coordination normal.  Skin: Skin is warm and dry.  Psychiatric: She has a normal mood and affect. Her behavior is normal. Judgment and thought content normal.       Assessment & Plan:   1. Routine general medical examination at a health care facility Normal physical exam. Needs labs but not fasting this morning. She will return for fasting labs. Mammogram ordered as well as this is due. - TSH; Future - CBC with Differential; Future - Lipid panel; Future - MM DIGITAL SCREENING BILATERAL; Future - Hepatic function panel; Future - Basic metabolic panel; Future

## 2013-12-04 NOTE — Addendum Note (Signed)
Addended by: Geni Bers on: 12/04/2013 11:34 AM   Modules accepted: Orders

## 2014-05-25 ENCOUNTER — Ambulatory Visit: Payer: Self-pay | Admitting: Nurse Practitioner

## 2014-06-03 ENCOUNTER — Encounter: Payer: Self-pay | Admitting: Nurse Practitioner

## 2014-06-03 ENCOUNTER — Ambulatory Visit (INDEPENDENT_AMBULATORY_CARE_PROVIDER_SITE_OTHER): Payer: 59 | Admitting: Nurse Practitioner

## 2014-06-03 VITALS — BP 170/98 | HR 63 | Temp 98.3°F | Resp 12 | Ht 71.5 in | Wt 223.0 lb

## 2014-06-03 DIAGNOSIS — M25562 Pain in left knee: Secondary | ICD-10-CM

## 2014-06-03 MED ORDER — TRAMADOL HCL 50 MG PO TABS: 50.0000 mg | ORAL_TABLET | Freq: Two times a day (BID) | ORAL | Status: DC

## 2014-06-03 NOTE — Progress Notes (Signed)
Pre visit review using our clinic review tool, if applicable. No additional management support is needed unless otherwise documented below in the visit note. 

## 2014-06-03 NOTE — Patient Instructions (Signed)
We will contact you about information regarding your referral.

## 2014-06-03 NOTE — Progress Notes (Signed)
Subjective:    Patient ID: Olivia Kirk, female    DOB: [birth year], 59 y.o.   MRN: [id number]  HPI  Olivia Kirk is a 59 yo female with a CC of left knee pain.   Onset- 5 years ago fell on left knee (tripped over hole in ground)   Fell down stairs, fell up stairs. No x-rays or checking up on it Location- Left knee inferior to patella Duration- 24/7  Characteristics- Sharp/shooting, aching  Aggravating factors- Sleeping is difficult due to pain she reports Relieving factors- pillow under knee/between knees Treatment to date: Advil, tylenol, aleve, Bengay, Icy-hot,- not helpful   Icing and heat, massage  Severity-  8/10  Stands 8 hours and then part time job stands  Ate late and just took her medication 1 hour ago   Review of Systems  Constitutional: Positive for activity change. Negative for fever, chills, diaphoresis and fatigue.       Cannot be as active  Respiratory: Negative for chest tightness, shortness of breath and stridor.   Cardiovascular: Negative for chest pain, palpitations and leg swelling.  Gastrointestinal: Negative for nausea, vomiting and diarrhea.  Musculoskeletal: Positive for arthralgias and gait problem. Negative for myalgias, back pain, joint swelling, neck pain and neck stiffness.       Left knee pain  Skin: Negative for color change and rash.       Varicose veins around knee cap on left  Neurological: Negative for dizziness, weakness, numbness and headaches.  Hematological: Does not bruise/bleed easily.   Past Medical History  Diagnosis Date  . Hypertension   . Hyperlipidemia     History   Social History  . Marital Status: Married    Spouse Name: [name]  . Number of Children: 3  . Years of Education: 12   Occupational History  . 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 Smoking status: Former Smoker -- 0.25 packs/day    Types: Cigarettes    Quit date:  05/07/1987  . Smokeless tobacco: Never Used  . Alcohol Use: 0.0 oz/week    0 Glasses of wine per week     Comment: Occassionally  . Drug Use: No  . Sexual Activity: Not on file   Other Topics Concern  . Not on file   Social History Narrative      Patient lives at home with her husband of 12 years. They have 3 adult children. Recently, one of their daughters moved in with them with her 3 small children. She has a remote tobacco hx. She quite approximately 25 years ago. She has two jobs - she works for [employer] in [employer] and also for [employer].    Past Surgical History  Procedure Laterality Date  . Tubal ligation    . Hemrrhoid      Family History  Problem Relation Age of Onset  . Hypertension Mother   . Cancer Father   . Heart disease Father   . Hypertension Maternal Grandmother   . Stroke Maternal Grandmother     Allergies  Allergen Reactions  . Guaifenesin & Derivatives Itching, Dermatitis and Rash    Current Outpatient Prescriptions on File Prior to Visit  Medication Sig Dispense Refill  . BIOTIN PO Take 1 capsule by mouth daily.    . hydrochlorothiazide (HYDRODIURIL) 25 MG tablet Take 1 tablet (25 mg total) by mouth daily. [place]  tablet 11  . metoprolol succinate (TOPROL-XL) 100 MG 24 hr tablet Take 1 tablet (100 mg total) by mouth daily. Take with or immediately following a meal. 30 tablet 11  . Multiple Vitamins-Minerals (MULTIVITAMIN WITH MINERALS) tablet Take 1 tablet by mouth daily.    . simvastatin (ZOCOR) 40 MG tablet Take 1 tablet (40 mg total) by mouth daily at 6 PM. 30 tablet 6   No current facility-administered medications on file prior to visit.        Objective:   Physical Exam  Constitutional: She is oriented to person, place, and time. She appears well-developed and well-nourished. No distress.  BP 170/98 mmHg  Pulse 63  Temp(Src) 98.3 F (36.8 C) (Oral)  Resp 12  Ht 5' 11.5" (1.816 m)  Wt 223 lb (101.152 kg)  BMI 30.67 kg/m2   SpO2 98% Repeat BP- 162/98  HENT:  Head: Normocephalic and atraumatic.  Right Ear: External ear normal.  Left Ear: External ear normal.  Cardiovascular: Normal rate, regular rhythm, normal heart sounds and intact distal pulses.  Exam reveals no gallop and no friction rub.   No murmur heard. Pulmonary/Chest: Effort normal and breath sounds normal. No respiratory distress. She has no wheezes. She has no rales. She exhibits no tenderness.  Musculoskeletal: She exhibits edema and tenderness.  Left medial inferior side of knee is edematous and has a few varicose veins. Tender to palpation. Negative for laxity or improper patella tracking.   Neurological: She is alert and oriented to person, place, and time. No cranial nerve deficit. She exhibits normal muscle tone. Coordination normal.  Skin: Skin is warm and dry. No rash noted. She is not diaphoretic.  Psychiatric: She has a normal mood and affect. Her behavior is normal. Judgment and thought content normal.       Assessment & Plan:

## 2014-06-07 DIAGNOSIS — M25562 Pain in left knee: Secondary | ICD-10-CM | POA: Insufficient documentation

## 2014-06-07 NOTE — Assessment & Plan Note (Signed)
Tramadol 50 mg daily for the pain (up to twice daily). Referral to ortho for further evaluation and treatment.

## 2014-08-28 ENCOUNTER — Other Ambulatory Visit: Payer: Self-pay

## 2014-08-28 DIAGNOSIS — E785 Hyperlipidemia, unspecified: Secondary | ICD-10-CM

## 2014-08-28 MED ORDER — SIMVASTATIN 40 MG PO TABS: 40.0000 mg | ORAL_TABLET | Freq: Every day | ORAL | Status: DC

## 2014-10-30 ENCOUNTER — Ambulatory Visit: Payer: 59 | Admitting: Nurse Practitioner

## 2014-12-15 ENCOUNTER — Other Ambulatory Visit: Payer: Self-pay

## 2014-12-15 MED ORDER — HYDROCHLOROTHIAZIDE 25 MG PO TABS: 25.0000 mg | ORAL_TABLET | Freq: Every day | ORAL | Status: DC

## 2014-12-15 MED ORDER — METOPROLOL SUCCINATE ER 100 MG PO TB24: 100.0000 mg | ORAL_TABLET | Freq: Every day | ORAL | Status: DC

## 2014-12-21 ENCOUNTER — Ambulatory Visit (INDEPENDENT_AMBULATORY_CARE_PROVIDER_SITE_OTHER): Payer: 59 | Admitting: Nurse Practitioner

## 2014-12-21 VITALS — BP 130/84 | HR 59 | Temp 98.7°F | Resp 16 | Ht 72.0 in | Wt 228.8 lb

## 2014-12-21 DIAGNOSIS — E669 Obesity, unspecified: Secondary | ICD-10-CM | POA: Diagnosis not present

## 2014-12-21 DIAGNOSIS — I10 Essential (primary) hypertension: Secondary | ICD-10-CM

## 2014-12-21 MED ORDER — METOPROLOL SUCCINATE ER 100 MG PO TB24: 100.0000 mg | ORAL_TABLET | Freq: Every day | ORAL | Status: DC

## 2014-12-21 MED ORDER — HYDROCHLOROTHIAZIDE 25 MG PO TABS: 25.0000 mg | ORAL_TABLET | Freq: Every day | ORAL | Status: DC

## 2014-12-21 NOTE — Progress Notes (Signed)
Pre visit review using our clinic review tool, if applicable. No additional management support is needed unless otherwise documented below in the visit note. 

## 2014-12-21 NOTE — Patient Instructions (Addendum)
Melatonin over the counter  Insomnia Insomnia is frequent trouble falling and/or staying asleep. Insomnia can be a long term problem or a short term problem. Both are common. Insomnia can be a short term problem when the wakefulness is related to a certain stress or worry. Long term insomnia is often related to ongoing stress during waking hours and/or poor sleeping habits. Overtime, sleep deprivation itself can make the problem worse. Every little thing feels more severe because you are overtired and your ability to cope is decreased. CAUSES   Stress, anxiety, and depression.  Poor sleeping habits.  Distractions such as TV in the bedroom.  Naps close to bedtime.  Engaging in emotionally charged conversations before bed.  Technical reading before sleep.  Alcohol and other sedatives. They may make the problem worse. They can hurt normal sleep patterns and normal dream activity.  Stimulants such as caffeine for several hours prior to bedtime.  Pain syndromes and shortness of breath can cause insomnia.  Exercise late at night.  Changing time zones may cause sleeping problems (jet lag). It is sometimes helpful to have someone observe your sleeping patterns. They should look for periods of not breathing during the night (sleep apnea). They should also look to see how long those periods last. If you live alone or observers are uncertain, you can also be observed at a sleep clinic where your sleep patterns will be professionally monitored. Sleep apnea requires a checkup and treatment. Give your caregivers your medical history. Give your caregivers observations your family has made about your sleep.  SYMPTOMS   Not feeling rested in the morning.  Anxiety and restlessness at bedtime.  Difficulty falling and staying asleep. TREATMENT   Your caregiver may prescribe treatment for an underlying medical disorders. Your caregiver can give advice or help if you are using alcohol or other drugs  for self-medication. Treatment of underlying problems will usually eliminate insomnia problems.  Medications can be prescribed for short time use. They are generally not recommended for lengthy use.  Over-the-counter sleep medicines are not recommended for lengthy use. They can be habit forming.  You can promote easier sleeping by making lifestyle changes such as:  Using relaxation techniques that help with breathing and reduce muscle tension.  Exercising earlier in the day.  Changing your diet and the time of your last meal. No night time snacks.  Establish a regular time to go to bed.  Counseling can help with stressful problems and worry.  Soothing music and white noise may be helpful if there are background noises you cannot remove.  Stop tedious detailed work at least one hour before bedtime. HOME CARE INSTRUCTIONS   Keep a diary. Inform your caregiver about your progress. This includes any medication side effects. See your caregiver regularly. Take note of:  Times when you are asleep.  Times when you are awake during the night.  The quality of your sleep.  How you feel the next day. This information will help your caregiver care for you.  Get out of bed if you are still awake after 15 minutes. Read or do some quiet activity. Keep the lights down. Wait until you feel sleepy and go back to bed.  Keep regular sleeping and waking hours. Avoid naps.  Exercise regularly.  Avoid distractions at bedtime. Distractions include watching television or engaging in any intense or detailed activity like attempting to balance the household checkbook.  Develop a bedtime ritual. Keep a familiar routine of bathing, brushing your teeth, climbing  into bed at the same time each night, listening to soothing music. Routines increase the success of falling to sleep faster.  Use relaxation techniques. This can be using breathing and muscle tension release routines. It can also include  visualizing peaceful scenes. You can also help control troubling or intruding thoughts by keeping your mind occupied with boring or repetitive thoughts like the old concept of counting sheep. You can make it more creative like imagining planting one beautiful flower after another in your backyard garden.  During your day, work to eliminate stress. When this is not possible use some of the previous suggestions to help reduce the anxiety that accompanies stressful situations. MAKE SURE YOU:   Understand these instructions.  Will watch your condition.  Will get help right away if you are not doing well or get worse. Document Released: 03/03/2000 Document Revised: 05/29/2011 Document Reviewed: 04/03/2007 [facility] Patient Information 2015 [place], [place]. This information is not intended to replace advice given to you by your health care provider. Make sure you discuss any questions you have with your health care provider.

## 2014-12-21 NOTE — Progress Notes (Signed)
Patient ID: Olivia Kirk, female    DOB: 1955/07/27  Age: 59 y.o. MRN: [id number]  CC: Follow-up   HPI Olivia Kirk presents for follow up of HTN.   1) Pt reports missing her medication and notices her ankles swell. She reports this is only once in awhile   BP Readings from Last 3 Encounters:  12/21/14 130/84  06/03/14 170/98  12/04/13 165/96   2) Obese-   Diet- no formal  Exercise- no formal Patient reports she has no time to focus on exercise or diet due to family stressors and working 2 jobs. She works 7 days a week and reports grabbing fast food whenever she gets a chance.  3) Headache- not sleeping enough, patient feels slightly achy headache frontal sinus area   History Olivia Kirk has a past medical history of Hypertension and Hyperlipidemia.   She has past surgical history that includes Tubal ligation and hemrrhoid.   Her family history includes Cancer in her father; Heart disease in her father; Hypertension in her maternal grandmother and mother; Stroke in her maternal grandmother.She reports that she quit smoking about 27 years ago. Her smoking use included Cigarettes. She smoked 0.25 packs per day. She has never used smokeless tobacco. She reports that she drinks alcohol. She reports that she does not use illicit drugs.  Outpatient Prescriptions Prior to Visit  Medication Sig Dispense Refill  . BIOTIN PO Take 1 capsule by mouth daily.    . Multiple Vitamins-Minerals (MULTIVITAMIN WITH MINERALS) tablet Take 1 tablet by mouth daily.    . simvastatin (ZOCOR) 40 MG tablet Take 1 tablet (40 mg total) by mouth daily at 6 PM. 30 tablet 6  . traMADol (ULTRAM) 50 MG tablet Take 1 tablet (50 mg total) by mouth 2 (two) times daily. 60 tablet 0  . hydrochlorothiazide (HYDRODIURIL) 25 MG tablet Take 1 tablet (25 mg total) by mouth daily. 30 tablet 0  . metoprolol succinate (TOPROL-XL) 100 MG 24 hr tablet Take 1 tablet (100 mg total) by mouth daily. Take with or immediately  following a meal. 30 tablet 0   No facility-administered medications prior to visit.    ROS Review of Systems  Constitutional: Negative for fever, chills, diaphoresis and fatigue.  Respiratory: Negative for chest tightness, shortness of breath and wheezing.   Cardiovascular: Positive for leg swelling. Negative for chest pain and palpitations.       Occasional ankle swelling.  Gastrointestinal: Negative for nausea, vomiting and diarrhea.  Skin: Negative for rash.  Neurological: Positive for headaches. Negative for dizziness, weakness and numbness.  Psychiatric/Behavioral: The patient is not nervous/anxious.     Objective:  BP 130/84 mmHg  Pulse 59  Temp(Src) 98.7 F (37.1 C)  Resp 16  Ht 6' (1.829 m)  Wt 228 lb 12.8 oz (103.783 kg)  BMI 31.02 kg/m2  SpO2 97%  Physical Exam  Constitutional: She is oriented to person, place, and time. She appears well-developed and well-nourished. No distress.  HENT:  Head: Normocephalic and atraumatic.  Right Ear: External ear normal.  Left Ear: External ear normal.  Cardiovascular: Normal rate, regular rhythm and normal heart sounds.  Exam reveals no gallop and no friction rub.   No murmur heard. Pulmonary/Chest: Effort normal and breath sounds normal. No respiratory distress. She has no wheezes. She has no rales. She exhibits no tenderness.  Neurological: She is alert and oriented to person, place, and time. No cranial nerve deficit. She exhibits normal muscle tone. Coordination normal.  Skin: Skin is  warm and dry. No rash noted. She is not diaphoretic.  Psychiatric: She has a normal mood and affect. Her behavior is normal. Judgment and thought content normal.   Assessment & Plan:   Olivia Kirk was seen today for follow-up.  Diagnoses and all orders for this visit:  Essential hypertension  Obese  Other orders -     hydrochlorothiazide (HYDRODIURIL) 25 MG tablet; Take 1 tablet (25 mg total) by mouth daily. -     metoprolol succinate  (TOPROL-XL) 100 MG 24 hr tablet; Take 1 tablet (100 mg total) by mouth daily. Take with or immediately following a meal.   I am having Olivia Kirk maintain her multivitamin with minerals, BIOTIN PO, traMADol, simvastatin, hydrochlorothiazide, and metoprolol succinate.  Meds ordered this encounter  Medications  . hydrochlorothiazide (HYDRODIURIL) 25 MG tablet    Sig: Take 1 tablet (25 mg total) by mouth daily.    Dispense:  30 tablet    Refill:  5    Order Specific Question:  Supervising Provider    Answer:  Deborra Medina L [[id number]]  . metoprolol succinate (TOPROL-XL) 100 MG 24 hr tablet    Sig: Take 1 tablet (100 mg total) by mouth daily. Take with or immediately following a meal.    Dispense:  30 tablet    Refill:  5    Order Specific Question:  Supervising Provider    Answer:  Crecencio Mc [[id number]]     Follow-up: Return in about 6 months (around 06/21/2015) for Follow-up.

## 2014-12-23 ENCOUNTER — Encounter: Payer: Self-pay | Admitting: Nurse Practitioner

## 2014-12-23 DIAGNOSIS — E669 Obesity, unspecified: Secondary | ICD-10-CM | POA: Insufficient documentation

## 2014-12-23 NOTE — Assessment & Plan Note (Signed)
Improved. Will continue current regimen of metoprolol and hydrochlorothiazide. Follow-up in 6 months  BP Readings from Last 3 Encounters:  12/21/14 130/84  06/03/14 170/98  12/04/13 165/96

## 2014-12-23 NOTE — Assessment & Plan Note (Signed)
Wt Readings from Last 3 Encounters:  12/21/14 228 lb 12.8 oz (103.783 kg)  06/03/14 223 lb (101.152 kg)  12/04/13 227 lb 12 oz (103.307 kg)  Patient has continued to fluctuate in weight over the last year. Patient reports she understands the importance of diet and exercise as it relates to her health. Discussed ways to take healthy foods with her when she runs between activities, meal prepping, getting some activity.

## 2015-03-23 ENCOUNTER — Ambulatory Visit: Payer: 59 | Admitting: Nurse Practitioner

## 2015-03-29 ENCOUNTER — Ambulatory Visit: Payer: 59 | Admitting: Nurse Practitioner

## 2015-04-07 ENCOUNTER — Ambulatory Visit: Payer: 59 | Admitting: Nurse Practitioner

## 2015-05-03 ENCOUNTER — Other Ambulatory Visit: Payer: Self-pay | Admitting: Nurse Practitioner

## 2015-08-20 ENCOUNTER — Telehealth: Payer: Self-pay | Admitting: Nurse Practitioner

## 2015-08-20 ENCOUNTER — Other Ambulatory Visit: Payer: Self-pay | Admitting: *Deleted

## 2015-08-20 MED ORDER — HYDROCHLOROTHIAZIDE 25 MG PO TABS: 25.0000 mg | ORAL_TABLET | Freq: Every day | ORAL | Status: DC

## 2015-08-20 MED ORDER — METOPROLOL SUCCINATE ER 100 MG PO TB24: 100.0000 mg | ORAL_TABLET | Freq: Every day | ORAL | Status: DC

## 2015-08-20 NOTE — Telephone Encounter (Signed)
Sent in refills for meds requested

## 2015-08-20 NOTE — Telephone Encounter (Signed)
metoprolol succinate (TOPROL-XL) 100 MG 24 hr tablet  hydrochlorothiazide (HYDRODIURIL) 25 MG tablet

## 2015-11-30 ENCOUNTER — Other Ambulatory Visit: Payer: Self-pay | Admitting: Nurse Practitioner

## 2015-11-30 NOTE — Telephone Encounter (Signed)
Refilled 05/04/15 and last seen 12/21/14. No future appointment with a new PCP. Please advise?

## 2016-02-02 ENCOUNTER — Encounter: Payer: Self-pay | Admitting: Family Medicine

## 2016-02-02 ENCOUNTER — Ambulatory Visit (INDEPENDENT_AMBULATORY_CARE_PROVIDER_SITE_OTHER): Payer: 59 | Admitting: Family Medicine

## 2016-02-02 ENCOUNTER — Telehealth: Payer: Self-pay | Admitting: Family Medicine

## 2016-02-02 VITALS — BP 162/98 | HR 66 | Temp 98.2°F | Resp 18 | Wt 228.1 lb

## 2016-02-02 DIAGNOSIS — E785 Hyperlipidemia, unspecified: Secondary | ICD-10-CM

## 2016-02-02 DIAGNOSIS — I10 Essential (primary) hypertension: Secondary | ICD-10-CM | POA: Diagnosis not present

## 2016-02-02 DIAGNOSIS — Z23 Encounter for immunization: Secondary | ICD-10-CM | POA: Diagnosis not present

## 2016-02-02 DIAGNOSIS — R7303 Prediabetes: Secondary | ICD-10-CM

## 2016-02-02 DIAGNOSIS — Z130 Encounter for screening for diseases of the blood and blood-forming organs and certain disorders involving the immune mechanism: Secondary | ICD-10-CM

## 2016-02-02 LAB — LIPID PANEL
CHOL/HDL RATIO: 4
Cholesterol: 188 mg/dL (ref 0–200)
HDL: 50.4 mg/dL (ref >39.00)
LDL CALC: 105 mg/dL — AB (ref 0–99)
NonHDL: 137.89
TRIGLYCERIDES: 163 mg/dL — AB (ref 0.0–149.0)
VLDL: 32.6 mg/dL (ref 0.0–40.0)

## 2016-02-02 LAB — CBC
HCT: 38 % (ref 36.0–46.0)
HEMOGLOBIN: 12.4 g/dL (ref 12.0–15.0)
MCHC: 32.6 g/dL (ref 30.0–36.0)
MCV: 81.2 fl (ref 78.0–100.0)
Platelets: 263 10*3/uL (ref 150.0–400.0)
RBC: 4.68 Mil/uL (ref 3.87–5.11)
RDW: 13.1 % (ref 11.5–15.5)
WBC: 6.9 10*3/uL (ref 4.0–10.5)

## 2016-02-02 LAB — COMPREHENSIVE METABOLIC PANEL
ALT: 12 U/L (ref 0–35)
AST: 16 U/L (ref 0–37)
Albumin: 4.7 g/dL (ref 3.5–5.2)
Alkaline Phosphatase: 50 U/L (ref 39–117)
BUN: 14 mg/dL (ref 6–23)
CALCIUM: 10.4 mg/dL (ref 8.4–10.5)
CHLORIDE: 102 meq/L (ref 96–112)
CO2: 32 meq/L (ref 19–32)
CREATININE: 0.87 mg/dL (ref 0.40–1.20)
GFR: 85.08 mL/min (ref >60.00)
Glucose, Bld: 86 mg/dL (ref 70–99)
Potassium: 4 mEq/L (ref 3.5–5.1)
SODIUM: 141 meq/L (ref 135–145)
Total Bilirubin: 0.4 mg/dL (ref 0.2–1.2)
Total Protein: 7.8 g/dL (ref 6.0–8.3)

## 2016-02-02 LAB — HEMOGLOBIN A1C: Hgb A1c MFr Bld: 6.4 % (ref 4.6–6.5)

## 2016-02-02 MED ORDER — METOPROLOL SUCCINATE ER 100 MG PO TB24: 100.0000 mg | ORAL_TABLET | Freq: Every day | ORAL | 3 refills | Status: DC

## 2016-02-02 MED ORDER — HYDROCHLOROTHIAZIDE 25 MG PO TABS: 25.0000 mg | ORAL_TABLET | Freq: Every day | ORAL | 3 refills | Status: DC

## 2016-02-02 NOTE — Telephone Encounter (Signed)
Pt states she needs a refill on sent to [facility] [address] hydrochlorothiazide (HYDRODIURIL) 25 MG tablet  simvastatin (ZOCOR) 40 MG tablet and  metoprolol succinate (TOPROL-XL) 100 MG 24 hr tablet

## 2016-02-02 NOTE — Telephone Encounter (Signed)
BP medication sent awaiting lipid panel before reordering simvastatin.

## 2016-02-02 NOTE — Patient Instructions (Signed)
We will call with your lab results.  We will be starting a new med once your labs return.  Follow up next week.  Take care  Dr. Lacinda Axon

## 2016-02-02 NOTE — Progress Notes (Signed)
Pre visit review using our clinic review tool, if applicable. No additional management support is needed unless otherwise documented below in the visit note. 

## 2016-02-02 NOTE — Assessment & Plan Note (Signed)
Patient with hypertension. Now experiencing exacerbation/worsening. Unclear etiology for worsening. Patient has not had labs in years. Obtain laboratory studies today. Continuing metoprolol and HCTZ. Planning to add ACEI or ARB pending laboratory workup.

## 2016-02-02 NOTE — Progress Notes (Signed)
Subjective:  Patient ID: Olivia Kirk, female    DOB: 18-Oct-1955  Age: 60 y.o. MRN: [id number]  CC: Elevated blood pressure  HPI:  60 year old female with hypertension, hyperlipidemia, obesity presents for evaluation regarding elevated blood pressure.  Patient has not been seen in approximately 1 year. She's not had labs in since 2014/2015.  Patient presents with complaints of elevated blood pressure. She states that on Friday she had a severe headache. She reports that she had swelling in her extremities as well. She took her blood pressure and it was markedly elevated at 224/110. Since that time she's taken her blood pressure at [facility] several times. Her blood pressure has continued to be elevated. It is typically in the [id number] systolic.  She endorses compliance with her HCTZ and metoprolol.  She states that she's taking it as prescribed. She is unsure of the inciting event for her elevated blood pressure. No new stressors or other known etiologies. No other associated symptoms at this time. No other complaints at this time.   Social Hx   Social History   Social History  . Marital status: Married    Spouse name: [name]  . Number of children: 3  . Years of education: 12   Occupational History  . 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 Cleaning services     [employer]   Social History Main Topics  . Smoking status: Former Smoker    Packs/day: 0.25    Types: Cigarettes    Quit date: 05/07/1987  . Smokeless tobacco: Never Used  . Alcohol use 0.0 oz/week     Comment: Occassionally  . Drug use: No  . Sexual activity: Not Asked   Other Topics Concern  . None   Social History Narrative      Patient lives at home with her husband of 8 years. They have 3 adult children. Recently, one of their daughters moved in with them with her 3 small children. She has a remote tobacco hx. She quite approximately 25 years ago. She has two jobs - she works for  [employer] in [place] [employer] and also for [employer].    Review of Systems  Cardiovascular: Positive for leg swelling.  Neurological: Positive for headaches.   Objective:  BP (!) 162/98 (BP Location: Right Arm, Patient Position: Sitting, Cuff Size: Large)   Pulse 66   Temp 98.2 F (36.8 C) (Oral)   Resp 18   Wt 228 lb 2 oz (103.5 kg)   SpO2 99%   BMI 30.94 kg/m   BP/Weight 02/02/2016 12/21/2014 0000000  Systolic BP 0000000 [id number]  Diastolic BP 98 84 98  Wt. (Lbs) 228.13 228.8 223  BMI 30.94 31.02 30.67   Physical Exam  Constitutional: She is oriented to person, place, and time. She appears well-developed. No distress.  Cardiovascular: Normal rate and regular rhythm.   2/6 systolic murmur.  Pulmonary/Chest: Effort normal and breath sounds normal.  Neurological: She is alert and oriented to person, place, and time.  Psychiatric: She has a normal mood and affect.  Vitals reviewed.  Lab Results  Component Value Date   WBC 6.8 05/20/2012   HGB 11.6 (L) 05/20/2012   HCT 35.5 (L) 05/20/2012   PLT 265.0 05/20/2012   GLUCOSE 95 05/20/2012   CHOL 171 05/20/2012   TRIG 127.0 05/20/2012   HDL 42.80 05/20/2012   LDLDIRECT 106.2 05/20/2012   LDLCALC 103 (H) 05/20/2012   ALT 16 05/20/2012  AST 19 05/20/2012   NA 140 05/20/2012   K 3.6 05/20/2012   CL 103 05/20/2012   CREATININE 0.9 05/20/2012   BUN 13 05/20/2012   CO2 29 05/20/2012   TSH 0.74 05/20/2012   HGBA1C 6.4 04/01/2013    Assessment & Plan:   Problem List Items Addressed This Visit    Severe hypertension - Primary    Patient with hypertension. Now experiencing exacerbation/worsening. Unclear etiology for worsening. Patient has not had labs in years. Obtain laboratory studies today. Continuing metoprolol and HCTZ. Planning to add ACEI or ARB pending laboratory workup.      Relevant Orders   Comprehensive metabolic panel   HLD (hyperlipidemia)   Relevant Orders   Lipid panel    Other Visit  Diagnoses    Encounter for immunization       Relevant Orders   Flu Vaccine QUAD 36+ mos IM (Completed)   CBC   Hemoglobin A1c   Comprehensive metabolic panel   Lipid panel   Screening for deficiency anemia       Relevant Orders   CBC   Prediabetes       Relevant Orders   Hemoglobin A1c     Follow-up: Return in about 1 week (around 02/09/2016).  [place]

## 2016-02-04 ENCOUNTER — Other Ambulatory Visit: Payer: Self-pay | Admitting: Family Medicine

## 2016-02-04 MED ORDER — ROSUVASTATIN CALCIUM 20 MG PO TABS: 20.0000 mg | ORAL_TABLET | Freq: Every day | ORAL | 3 refills | Status: DC

## 2016-02-09 ENCOUNTER — Encounter: Payer: Self-pay | Admitting: Family Medicine

## 2016-02-09 ENCOUNTER — Ambulatory Visit (INDEPENDENT_AMBULATORY_CARE_PROVIDER_SITE_OTHER): Payer: 59 | Admitting: Family Medicine

## 2016-02-09 VITALS — BP 136/88 | HR 74 | Temp 98.5°F | Resp 14 | Wt 232.5 lb

## 2016-02-09 DIAGNOSIS — Z1231 Encounter for screening mammogram for malignant neoplasm of breast: Secondary | ICD-10-CM

## 2016-02-09 DIAGNOSIS — I10 Essential (primary) hypertension: Secondary | ICD-10-CM | POA: Diagnosis not present

## 2016-02-09 DIAGNOSIS — R7303 Prediabetes: Secondary | ICD-10-CM | POA: Insufficient documentation

## 2016-02-09 DIAGNOSIS — Z1239 Encounter for other screening for malignant neoplasm of breast: Secondary | ICD-10-CM

## 2016-02-09 DIAGNOSIS — E1169 Type 2 diabetes mellitus with other specified complication: Secondary | ICD-10-CM | POA: Insufficient documentation

## 2016-02-09 NOTE — Assessment & Plan Note (Signed)
Established problem, improving. We discussed new ACC/AHA guidelines advocating for new goal of 130/80. Patient is fairly close to goal and wants to try lifestyle changes prior to additional medication. Follow-up in one month.

## 2016-02-09 NOTE — Progress Notes (Signed)
Pre visit review using our clinic review tool, if applicable. No additional management support is needed unless otherwise documented below in the visit note. 

## 2016-02-09 NOTE — Progress Notes (Signed)
Subjective:  Patient ID: [place], female    DOB: 1955/09/20  Age: 60 y.o. MRN: [id number]  CC: Follow up HTN  HPI:  60 year old female with hypertension, prediabetes, obesity, hyperlipidemia presents for follow-up regarding her hypertension.  HTN  BP is improved. She's had better readings at home. Majority of readings less than 140/90.  She endorses compliance with metoprolol and HCTZ.  Social Hx   Social History   Social History  . Marital status: Married    Spouse name: Letitia Libra [place]  . Number of children: 3  . Years of education: 12   Occupational History  . 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 Cleaning services     [employer]   Social History Main Topics  . Smoking status: Former Smoker    Packs/day: 0.25    Types: Cigarettes    Quit date: 05/07/1987  . Smokeless tobacco: Never Used  . Alcohol use 0.0 oz/week     Comment: Occassionally  . Drug use: No  . Sexual activity: Not Asked   Other Topics Concern  . None   Social History Narrative      Patient lives at home with her husband of 66 years. They have 3 adult children. Recently, one of their daughters moved in with them with her 3 small children. She has a remote tobacco hx. She quite approximately 25 years ago. She has two jobs - she works for [employer] in [place] [employer] and also for [employer].   Review of Systems  Constitutional: Negative.   Cardiovascular: Negative.    Objective:  BP 136/88 (BP Location: Left Arm, Patient Position: Sitting, Cuff Size: Large)   Pulse 74   Temp 98.5 F (36.9 C) (Oral)   Resp 14   Wt 232 lb 8 oz (105.5 kg)   SpO2 99%   BMI 31.53 kg/m   BP/Weight 02/09/2016 02/02/2016 0000000  Systolic BP [id number] 0000000 [id number]  Diastolic BP 88 98 84  Wt. (Lbs) 232.5 228.13 228.8  BMI 31.53 30.94 31.02   Physical Exam  Constitutional: She is oriented to person, place, and time. She appears well-developed. No distress.  Cardiovascular: Normal  rate and regular rhythm.   Pulmonary/Chest: Effort normal and breath sounds normal.  Neurological: She is alert and oriented to person, place, and time.  Psychiatric: She has a normal mood and affect.  Vitals reviewed.  Lab Results  Component Value Date   WBC 6.9 02/02/2016   HGB 12.4 02/02/2016   HCT 38.0 02/02/2016   PLT 263.0 02/02/2016   GLUCOSE 86 02/02/2016   CHOL 188 02/02/2016   TRIG 163.0 (H) 02/02/2016   HDL 50.40 02/02/2016   LDLDIRECT 106.2 05/20/2012   LDLCALC 105 (H) 02/02/2016   ALT 12 02/02/2016   AST 16 02/02/2016   NA 141 02/02/2016   K 4.0 02/02/2016   CL 102 02/02/2016   CREATININE 0.87 02/02/2016   BUN 14 02/02/2016   CO2 32 02/02/2016   TSH 0.74 05/20/2012   HGBA1C 6.4 02/02/2016    Assessment & Plan:   Problem List Items Addressed This Visit    Essential hypertension - Primary    Established problem, improving. We discussed new ACC/AHA guidelines advocating for new goal of 130/80. Patient is fairly close to goal and wants to try lifestyle changes prior to additional medication. Follow-up in one month.       Other Visit Diagnoses    Screening for breast cancer  Relevant Orders   MM DIGITAL SCREENING BILATERAL     Follow-up: Return in about 1 month (around 03/10/2016) for HTN follow up.  [place]

## 2016-02-09 NOTE — Patient Instructions (Addendum)
Dietary changes and exercise as we discussed.  Follow up in 1 month.  Take care  Dr Lacinda Axon   [facility] Eating Plan DASH stands for "Dietary Approaches to Stop Hypertension." The DASH eating plan is a healthy eating plan that has been shown to reduce high blood pressure (hypertension). Additional health benefits may include reducing the risk of type 2 diabetes mellitus, heart disease, and stroke. The DASH eating plan may also help with weight loss. What do I need to know about the DASH eating plan? For the DASH eating plan, you will follow these general guidelines:  Choose foods with less than 150 milligrams of sodium per serving (as listed on the food label).  Use salt-free seasonings or herbs instead of table salt or sea salt.  Check with your health care provider or pharmacist before using salt substitutes.  Eat lower-sodium products. These are often labeled as "low-sodium" or "no salt added."  Eat fresh foods. Avoid eating a lot of canned foods.  Eat more vegetables, fruits, and low-fat dairy products.  Choose whole grains. Look for the word "whole" as the first word in the ingredient list.  Choose fish and skinless chicken or [place] more often than red meat. Limit fish, poultry, and meat to 6 oz (170 g) each day.  Limit sweets, desserts, sugars, and sugary drinks.  Choose heart-healthy fats.  Eat more home-cooked food and less restaurant, buffet, and fast food.  Limit fried foods.  Do not fry foods. [place] foods using methods such as baking, boiling, grilling, and broiling instead.  When eating at a restaurant, ask that your food be prepared with less salt, or no salt if possible. What foods can I eat? Seek help from a dietitian for individual calorie needs. Grains  Whole grain or whole wheat bread. Brown rice. Whole grain or whole wheat pasta. Quinoa, bulgur, and whole grain cereals. Low-sodium cereals. Corn or whole wheat flour tortillas. Whole grain cornbread. Whole grain  crackers. Low-sodium crackers. Vegetables  Fresh or frozen vegetables (raw, steamed, roasted, or grilled). Low-sodium or reduced-sodium tomato and vegetable juices. Low-sodium or reduced-sodium tomato sauce and paste. Low-sodium or reduced-sodium canned vegetables. Fruits  All fresh, canned (in natural juice), or frozen fruits. Meat and Other Protein Products  Ground beef (85% or leaner), grass-fed beef, or beef trimmed of fat. Skinless chicken or [place]. Ground chicken or [place]. Pork trimmed of fat. All fish and seafood. Eggs. Dried beans, peas, or lentils. Unsalted nuts and seeds. Unsalted canned beans. Dairy  Low-fat dairy products, such as skim or 1% milk, 2% or reduced-fat cheeses, low-fat ricotta or cottage cheese, or plain low-fat yogurt. Low-sodium or reduced-sodium cheeses. Fats and Oils  Tub margarines without trans fats. Light or reduced-fat mayonnaise and salad dressings (reduced sodium). Avocado. Safflower, olive, or canola oils. Natural peanut or almond butter. Other  Unsalted popcorn and pretzels. The items listed above may not be a complete list of recommended foods or beverages. Contact your dietitian for more options.  What foods are not recommended? Grains  White bread. White pasta. White rice. Refined cornbread. Bagels and croissants. Crackers that contain trans fat. Vegetables  Creamed or fried vegetables. Vegetables in a cheese sauce. Regular canned vegetables. Regular canned tomato sauce and paste. Regular tomato and vegetable juices. Fruits  Canned fruit in light or heavy syrup. Fruit juice. Meat and Other Protein Products  Fatty cuts of meat. Ribs, chicken wings, bacon, sausage, bologna, salami, chitterlings, fatback, hot dogs, bratwurst, and packaged luncheon meats. Salted nuts and seeds.  Canned beans with salt. Dairy  Whole or 2% milk, cream, half-and-half, and cream cheese. Whole-fat or sweetened yogurt. Full-fat cheeses or blue cheese. Nondairy creamers and  whipped toppings. Processed cheese, cheese spreads, or cheese curds. Condiments  Onion and garlic salt, seasoned salt, table salt, and sea salt. Canned and packaged gravies. Worcestershire sauce. Tartar sauce. Barbecue sauce. Teriyaki sauce. Soy sauce, including reduced sodium. Steak sauce. Fish sauce. Oyster sauce. Cocktail sauce. Horseradish. Ketchup and mustard. Meat flavorings and tenderizers. Bouillon cubes. Hot sauce. Tabasco sauce. Marinades. Taco seasonings. Relishes. Fats and Oils  Butter, stick margarine, lard, shortening, ghee, and bacon fat. Coconut, palm kernel, or palm oils. Regular salad dressings. Other  Pickles and olives. Salted popcorn and pretzels. The items listed above may not be a complete list of foods and beverages to avoid. Contact your dietitian for more information.  Where can I find more information? National Heart, Lung, and Blood Institute: travelstabloid.com This information is not intended to replace advice given to you by your health care provider. Make sure you discuss any questions you have with your health care provider. Document Released: 02/23/2011 Document Revised: 08/12/2015 Document Reviewed: 01/08/2013 Elsevier Interactive Patient Education  2017 [employer].

## 2016-02-18 ENCOUNTER — Ambulatory Visit: Payer: 59 | Admitting: Family Medicine

## 2016-03-10 ENCOUNTER — Ambulatory Visit: Payer: 59 | Admitting: Family Medicine

## 2016-03-16 ENCOUNTER — Encounter: Payer: Self-pay | Admitting: Family Medicine

## 2016-03-16 ENCOUNTER — Ambulatory Visit (INDEPENDENT_AMBULATORY_CARE_PROVIDER_SITE_OTHER): Payer: 59 | Admitting: Family Medicine

## 2016-03-16 DIAGNOSIS — I10 Essential (primary) hypertension: Secondary | ICD-10-CM

## 2016-03-16 NOTE — Assessment & Plan Note (Signed)
Stable. BP elevated mildly today. Patient continues to want to proceed with lifestyle changes. Continue metoprolol and HCTZ. Advised decreasing carbs and increasing protein and vegetables. Advised regular exercise.

## 2016-03-16 NOTE — Progress Notes (Signed)
Pre visit review using our clinic review tool, if applicable. No additional management support is needed unless otherwise documented below in the visit note. 

## 2016-03-16 NOTE — Patient Instructions (Signed)
Dietary changes and exercise.  See you in 6 months.  Take care  Dr. Lacinda Axon

## 2016-03-16 NOTE — Progress Notes (Signed)
   Subjective:  Patient ID: Olivia Kirk, female    DOB: 1955/07/13  Age: 60 y.o. MRN: [id number]  CC: Follow up HTN  HPI:  60 year old female presents for follow-up regarding her hypertension.  HTN  BP stable but still mildly elevated.  Goal <130/80.  Endorses compliance with HCTZ and metoprolol.  No other complaints or concerns at this time.  No currently watching diet or exercising.  Social Hx   Social History   Social History  . Marital status: Married    Spouse name: [name]  . Number of children: 3  . Years of education: 12   Occupational History  . 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 Cleaning services     [employer]   Social History Main Topics  . Smoking status: Former Smoker    Packs/day: 0.25    Types: Cigarettes    Quit date: 05/07/1987  . Smokeless tobacco: Never Used  . Alcohol use 0.0 oz/week     Comment: Occassionally  . Drug use: No  . Sexual activity: Not Asked   Other Topics Concern  . None   Social History Narrative      Patient lives at home with her husband of 68 years. They have 3 adult children. Recently, one of their daughters moved in with them with her 3 small children. She has a remote tobacco hx. She quite approximately 25 years ago. She has two jobs - she works for [employer] in [place] [employer] and also for [employer].   Review of Systems  Constitutional: Negative.   Cardiovascular: Negative.    Objective:  BP (!) 145/83   Pulse 63   Temp 98 F (36.7 C) (Oral)   Wt 230 lb 9.6 oz (104.6 kg)   SpO2 94%   BMI 31.27 kg/m   BP/Weight 03/16/2016 02/09/2016 0000000  Systolic BP Q000111Q [id number] 0000000  Diastolic BP 83 88 98  Wt. (Lbs) 230.6 232.5 228.13  BMI 31.27 31.53 30.94   Physical Exam  Constitutional: She is oriented to person, place, and time. She appears well-developed. No distress.  Cardiovascular: Normal rate and regular rhythm.   Pulmonary/Chest: Effort normal and breath sounds  normal.  Neurological: She is alert and oriented to person, place, and time.  Psychiatric: She has a normal mood and affect.  Vitals reviewed.  Lab Results  Component Value Date   WBC 6.9 02/02/2016   HGB 12.4 02/02/2016   HCT 38.0 02/02/2016   PLT 263.0 02/02/2016   GLUCOSE 86 02/02/2016   CHOL 188 02/02/2016   TRIG 163.0 (H) 02/02/2016   HDL 50.40 02/02/2016   LDLDIRECT 106.2 05/20/2012   LDLCALC 105 (H) 02/02/2016   ALT 12 02/02/2016   AST 16 02/02/2016   NA 141 02/02/2016   K 4.0 02/02/2016   CL 102 02/02/2016   CREATININE 0.87 02/02/2016   BUN 14 02/02/2016   CO2 32 02/02/2016   TSH 0.74 05/20/2012   HGBA1C 6.4 02/02/2016    Assessment & Plan:   Problem List Items Addressed This Visit    Essential hypertension    Stable. BP elevated mildly today. Patient continues to want to proceed with lifestyle changes. Continue metoprolol and HCTZ. Advised decreasing carbs and increasing protein and vegetables. Advised regular exercise.        Follow-up: 6 months  [place] DO [facility]

## 2016-03-20 DIAGNOSIS — Z923 Personal history of irradiation: Secondary | ICD-10-CM

## 2016-03-20 HISTORY — DX: Personal history of irradiation: Z92.3

## 2016-03-22 ENCOUNTER — Ambulatory Visit: Payer: 59 | Attending: Family Medicine

## 2016-05-04 ENCOUNTER — Ambulatory Visit: Payer: 59

## 2016-05-11 ENCOUNTER — Ambulatory Visit
Admission: RE | Admit: 2016-05-11 | Discharge: 2016-05-11 | Disposition: A | Discharge status: Home / Self Care | Payer: 59 | Source: Ambulatory Visit | Attending: Family Medicine | Admitting: Family Medicine

## 2016-05-11 DIAGNOSIS — Z1231 Encounter for screening mammogram for malignant neoplasm of breast: Secondary | ICD-10-CM | POA: Diagnosis not present

## 2016-05-11 DIAGNOSIS — Z1239 Encounter for other screening for malignant neoplasm of breast: Secondary | ICD-10-CM

## 2016-05-12 ENCOUNTER — Other Ambulatory Visit: Payer: Self-pay | Admitting: Family Medicine

## 2016-05-12 ENCOUNTER — Other Ambulatory Visit: Payer: Self-pay

## 2016-05-12 DIAGNOSIS — R921 Mammographic calcification found on diagnostic imaging of breast: Secondary | ICD-10-CM

## 2016-05-12 DIAGNOSIS — R928 Other abnormal and inconclusive findings on diagnostic imaging of breast: Secondary | ICD-10-CM

## 2016-05-25 ENCOUNTER — Ambulatory Visit
Admission: RE | Admit: 2016-05-25 | Discharge: 2016-05-25 | Disposition: A | Discharge status: Home / Self Care | Payer: 59 | Source: Ambulatory Visit | Attending: Family Medicine | Admitting: Family Medicine

## 2016-05-25 ENCOUNTER — Other Ambulatory Visit: Payer: Self-pay | Admitting: Family Medicine

## 2016-05-25 DIAGNOSIS — R921 Mammographic calcification found on diagnostic imaging of breast: Secondary | ICD-10-CM | POA: Diagnosis not present

## 2016-05-25 DIAGNOSIS — R928 Other abnormal and inconclusive findings on diagnostic imaging of breast: Secondary | ICD-10-CM | POA: Diagnosis not present

## 2016-06-06 ENCOUNTER — Ambulatory Visit
Admission: RE | Admit: 2016-06-06 | Discharge: 2016-06-06 | Disposition: A | Discharge status: Home / Self Care | Payer: 59 | Source: Ambulatory Visit | Attending: Family Medicine | Admitting: Family Medicine

## 2016-06-06 DIAGNOSIS — R928 Other abnormal and inconclusive findings on diagnostic imaging of breast: Secondary | ICD-10-CM | POA: Diagnosis present

## 2016-06-06 DIAGNOSIS — C50212 Malignant neoplasm of upper-inner quadrant of left female breast: Secondary | ICD-10-CM | POA: Diagnosis not present

## 2016-06-06 DIAGNOSIS — C50919 Malignant neoplasm of unspecified site of unspecified female breast: Secondary | ICD-10-CM

## 2016-06-06 DIAGNOSIS — R921 Mammographic calcification found on diagnostic imaging of breast: Secondary | ICD-10-CM

## 2016-06-06 HISTORY — DX: Malignant neoplasm of unspecified site of unspecified female breast: C50.919

## 2016-06-06 HISTORY — PX: BREAST BIOPSY: SHX20

## 2016-06-07 ENCOUNTER — Telehealth: Payer: Self-pay

## 2016-06-07 NOTE — Telephone Encounter (Signed)
Joann from Radiology called with Left Breast Biopsy results.  Viewable in EPIC, Positive for carcinoma.  Asked for PCP's stance on calling results to patient or if we would like the radiologist to complete. Discussed with Dr. Caryl Bis, agreed to allow them to call.  Called Joann back to give feedback. thanks

## 2016-06-07 NOTE — Telephone Encounter (Signed)
Discussed with RN. Agreed with having radiology contact patient regarding results and to start the referral process. Will send to PCP as an FYI.

## 2016-06-08 ENCOUNTER — Other Ambulatory Visit: Payer: Self-pay | Admitting: Family Medicine

## 2016-06-08 ENCOUNTER — Telehealth: Payer: Self-pay | Admitting: Family Medicine

## 2016-06-08 DIAGNOSIS — C50912 Malignant neoplasm of unspecified site of left female breast: Secondary | ICD-10-CM | POA: Insufficient documentation

## 2016-06-08 LAB — SURGICAL PATHOLOGY

## 2016-06-08 NOTE — Progress Notes (Signed)
  Oncology Nurse Navigator Documentation  Navigator Location: [facility] (06/08/16 1700)   )Navigator Encounter Type: Introductory phone call (06/08/16 1700)                         Barriers/Navigation Needs: Coordination of Care (06/08/16 1700) Education: Accessing Care/ Finding Providers (06/08/16 1700) Interventions: Coordination of Care (06/08/16 1700)   Coordination of Care: Appts (06/08/16 1700)                  Time Spent with Patient: 30 (06/08/16 1700)  Scheduled patient with Dr. Grayland Ormond 06/16/16 at 10:15. States her husband is anxious about diagnosis.  Encouraged him to accompany her to surgical, and oncology consults if possible.

## 2016-06-08 NOTE — Telephone Encounter (Signed)
Pt needs a referral to the [facility]. And one for Dr Bary Castilla. Thank you!

## 2016-06-08 NOTE — Progress Notes (Signed)
  Oncology Nurse Navigator Documentation  Navigator Location: [facility] (06/08/16 1400)   )Navigator Encounter Type: Introductory phone call (06/08/16 1400)   Abnormal Finding Date: 05/25/16 (06/08/16 1400) Confirmed Diagnosis Date: 06/06/16 (06/08/16 1400)                   Barriers/Navigation Needs: Coordination of Care;Education (06/08/16 1400) Education: Accessing Care/ Finding Providers;Understanding Cancer/ Treatment Options;Coping with Diagnosis/ Prognosis;Newly Diagnosed Cancer Education (06/08/16 1400) Interventions: Coordination of Care;Education;Referrals (06/08/16 1400)   Coordination of Care: Appts (06/08/16 1400) Education Method: Teach-back;Verbal;Written (06/08/16 1400)      Acuity: Level 2 (06/08/16 1400)   Acuity Level 2: Initial guidance, education and coordination as needed;Educational needs;Assistance expediting appointments (06/08/16 1400)     Time Spent with Patient: 60 (06/08/16 1400)   Introduced patient to IT trainer.  Scheduled Surgical consult with Dr. Bary Castilla on 06/09/16 at 10:30. Patient to arrive at 10:15 with photo ID, current meds, and insurance card.  Breast Cancer Treatment Handbook/folder with Hospital services to office. Notified Dr. Jonathon Jordan office of appointment, and request for referral to Dr. Bary Castilla, and Med/Onc.

## 2016-06-09 ENCOUNTER — Encounter: Payer: Self-pay | Admitting: General Surgery

## 2016-06-09 ENCOUNTER — Ambulatory Visit (INDEPENDENT_AMBULATORY_CARE_PROVIDER_SITE_OTHER): Payer: 59 | Admitting: General Surgery

## 2016-06-09 ENCOUNTER — Inpatient Hospital Stay: Payer: Self-pay

## 2016-06-09 VITALS — BP 130/74 | HR 70 | Resp 14 | Ht 71.0 in | Wt 221.0 lb

## 2016-06-09 DIAGNOSIS — C50212 Malignant neoplasm of upper-inner quadrant of left female breast: Secondary | ICD-10-CM | POA: Diagnosis not present

## 2016-06-09 DIAGNOSIS — Z170 Estrogen receptor positive status [ER+]: Secondary | ICD-10-CM | POA: Diagnosis not present

## 2016-06-09 DIAGNOSIS — Z853 Personal history of malignant neoplasm of breast: Secondary | ICD-10-CM | POA: Insufficient documentation

## 2016-06-09 NOTE — Progress Notes (Signed)
Patient ID: Olivia Kirk, female   DOB: 1955-12-15, 61 y.o.   MRN: [id number]  Chief Complaint  Patient presents with  . Other    HPI Olivia Kirk is a 61 y.o. female here today to discuss breast cancer results. Patient last mammogram was 05/11/2016 added views on 05/25/2016 and left breast stereo biopsy done on 06/06/2016. Patient does perform self breast checks and gets does not regular mammograms. Husband, [name] present at visit.   HPI  Past Medical History:  Diagnosis Date  . Breast cancer ([place]) 06/06/2016   Invasive Mammary Carcinoma, ER/PR pos, HER2 neg  . Hyperlipidemia   . Hypertension     Past Surgical History:  Procedure Laterality Date  . BREAST BIOPSY Left 06/06/2016   left stereo path pending  . hemrrhoid    . TUBAL LIGATION      Family History  Problem Relation Age of Onset  . Hypertension Mother   . Cancer Father   . Heart disease Father   . Hypertension Maternal Grandmother   . Stroke Maternal Grandmother   . Breast cancer Paternal Aunt     65    Social History Social History  Substance Use Topics  . Smoking status: Former Smoker    Packs/day: 0.25    Types: Cigarettes    Quit date: 05/07/1987  . Smokeless tobacco: Never Used  . Alcohol use 0.0 oz/week     Comment: Occassionally    Allergies  Allergen Reactions  . Guaifenesin & Derivatives Itching, Dermatitis and Rash    Current Outpatient Prescriptions  Medication Sig Dispense Refill  . BIOTIN PO Take 1 capsule by mouth daily.    . Cholecalciferol (VITAMIN D) 2000 units CAPS Take by mouth.    . hydrochlorothiazide (HYDRODIURIL) 25 MG tablet Take 1 tablet (25 mg total) by mouth daily. 90 tablet 3  . metoprolol succinate (TOPROL-XL) 100 MG 24 hr tablet Take 1 tablet (100 mg total) by mouth daily. Take with or immediately following a meal. 90 tablet 3  . Multiple Vitamins-Minerals (MULTIVITAMIN WITH MINERALS) tablet Take 1 tablet by mouth daily.    . Omega-3 Fatty Acids (FISH  OIL) 1000 MG CPDR Take by mouth.    . rosuvastatin (CRESTOR) 20 MG tablet Take 1 tablet (20 mg total) by mouth daily. 90 tablet 3   No current facility-administered medications for this visit.     Review of Systems Review of Systems  Constitutional: Negative.   Respiratory: Negative.   Cardiovascular: Negative.     Blood pressure 130/74, pulse 70, resp. rate 14, height '5\' 11"'$  (1.803 m), weight 221 lb (100.2 kg).  Physical Exam Physical Exam  Constitutional: She is oriented to person, place, and time. She appears well-developed and well-nourished.  Eyes: Conjunctivae are normal. No scleral icterus.  Neck: Neck supple.  Cardiovascular: Normal rate, regular rhythm and normal heart sounds.   Pulmonary/Chest: Effort normal and breath sounds normal. Right breast exhibits no inverted nipple, no mass, no nipple discharge, no skin change and no tenderness. Left breast exhibits no inverted nipple, no nipple discharge, no skin change and no tenderness.  Abdominal: There is no tenderness.  Lymphadenopathy:    She has no cervical adenopathy.    She has no axillary adenopathy.       Left: No supraclavicular adenopathy present.  Neurological: She is alert and oriented to person, place, and time.  Skin: Skin is warm and dry.    Data Reviewed Mammograms from July 2014 through March 2018 reviewed.  2 cm area of microcalcifications in the upper inner quadrant of the left breast. DIAGNOSIS:  A. BREAST, LEFT, UPPER INNER QUADRANT; STEREOTACTIC CORE BIOPSY:  - INVASIVE MAMMARY CARCINOMA, NO SPECIAL TYPE.  - DUCTAL CARCINOMA IN SITU, INTERMEDIATE NUCLEAR GRADE WITH  COMEDONECROSIS AND CALCIFICATIONS, POSSIBLY EXTENSIVE,  BREAST BIOMARKER TESTS  Estrogen Receptor (ER) Status: POSITIVE, > 90% nuclear staining  Progesterone Receptor (PgR) Status: POSITIVE, > 90% nuclear staining  HER2 (by immunohistochemistry): NEGATIVE (Score 1+)  Percentage of cells with uniform intense complete membrane staining:  0%   Ultrasound examination was completed today to determine if the biopsy cavity could be well-visualized. This was not the case. Wire localization prior to the procedure will be appropriate. No images, no charge.  Assessment    Clinical stage I carcinoma the left breast.    Plan    The majority of the visit was spent reviewing the options for breast cancer treatment. Breast conservation with lumpectomy and radiation therapy  was presented as equivalent to mastectomy for long-term control. The pros and cons of each treatment regimen were reviewed. The indications for additional therapy such as chemotherapy were touched on briefly, realizing that the majority of information required to determine if chemotherapy would be of benefit is not available at this time. Second surgical opinion is available if she desires. She has an appointment with medical oncology on 06/16/2016. She is aware that this surgical procedure does not determine whether or not she benefits from adjuvant therapy. This will be based on tumor size, lymph node status and likely Mammoprint testing.   An informational booklet was provided as well as websites to assist with her decision making.  The patient's husband was thinking about fussing as she had been 4 years between mammograms. It turns out he is not had a recent physical exam, digital prostate exam, PSA or colonoscopy. He was given a homework assignments to have an appointment scheduled with his PCP.  This information has been scribed by Gaspar Cola CMA.   Robert Bellow 06/09/2016, 11:38 AM

## 2016-06-13 NOTE — Progress Notes (Signed)
[name]  Telephone:([phone] Fax:([phone]) [phone]  ID: Olivia Kirk Rica OB: [birth year]  MR#: [id number]  KDT#:[id number]  Patient Care Team: Coral Spikes, DO as PCP - General (Family Medicine)  CHIEF COMPLAINT: Clinical stage IB ER/PR positive, HER-2 negative invasive carcinoma of the upper inner quadrant of the left breast.  INTERVAL HISTORY: Patient is a 61 year old female who was noted to have an abnormality on routine screening mammogram. Subsequent ultrasound and biopsy revealed the above stated breast cancer. She currently feels well and is asymptomatic. She has no neurologic complaints. She denies any recent fevers or illnesses. She has a good appetite and denies weight loss. She has no chest pain or shortness of breath. She denies any nausea, vomiting, constipation, or diarrhea. She has no urinary complaints. Patient feels at her baseline and offers no specific complaints today.  REVIEW OF SYSTEMS:   Review of Systems  Constitutional: Negative.  Negative for fever, malaise/fatigue and weight loss.  Respiratory: Negative.  Negative for cough and shortness of breath.   Cardiovascular: Negative.  Negative for chest pain and leg swelling.  Gastrointestinal: Negative.  Negative for abdominal pain.  Genitourinary: Negative.   Musculoskeletal: Negative.   Neurological: Negative.  Negative for sensory change and weakness.  Psychiatric/Behavioral: Negative.  The patient is not nervous/anxious.     As per HPI. Otherwise, a complete review of systems is negative.  PAST MEDICAL HISTORY: Past Medical History:  Diagnosis Date  . Breast cancer ([place]) 06/06/2016   Invasive Mammary Carcinoma, ER/PR pos, HER2 neg  . Hyperlipidemia   . Hypertension     PAST SURGICAL HISTORY: Past Surgical History:  Procedure Laterality Date  . BREAST BIOPSY Left 06/06/2016   left stereo path pending  . hemrrhoid    . TUBAL LIGATION      FAMILY HISTORY: Family History    Problem Relation Age of Onset  . Hypertension Mother   . Cancer Father   . Heart disease Father   . Hypertension Maternal Grandmother   . Stroke Maternal Grandmother   . Breast cancer Paternal Aunt     83    ADVANCED DIRECTIVES (Y/N):  N  HEALTH MAINTENANCE: Social History  Substance Use Topics  . Smoking status: Former Smoker    Packs/day: 0.25    Types: Cigarettes    Quit date: 05/07/1987  . Smokeless tobacco: Never Used  . Alcohol use 0.0 oz/week     Comment: Occassionally     Colonoscopy:  PAP:  Bone density:  Lipid panel:  Allergies  Allergen Reactions  . Guaifenesin & Derivatives Itching, Dermatitis and Rash    Current Outpatient Prescriptions  Medication Sig Dispense Refill  . BIOTIN PO Take 1 capsule by mouth daily.    . Cholecalciferol (VITAMIN D) 2000 units CAPS Take by mouth.    . hydrochlorothiazide (HYDRODIURIL) 25 MG tablet Take 1 tablet (25 mg total) by mouth daily. 90 tablet 3  . metoprolol succinate (TOPROL-XL) 100 MG 24 hr tablet Take 1 tablet (100 mg total) by mouth daily. Take with or immediately following a meal. 90 tablet 3  . Multiple Vitamins-Minerals (MULTIVITAMIN WITH MINERALS) tablet Take 1 tablet by mouth daily.    . Omega-3 Fatty Acids (FISH OIL) 1000 MG CPDR Take by mouth.    . rosuvastatin (CRESTOR) 20 MG tablet Take 1 tablet (20 mg total) by mouth daily. 90 tablet 3  . VITAMIN E PO Take by mouth.     No current facility-administered medications for this visit.  OBJECTIVE: Vitals:   06/16/16 1047  BP: (!) 163/97  Pulse: 62  Temp: 97.7 F (36.5 C)     Body mass index is 32.12 kg/m.    ECOG FS:0 - Asymptomatic  General: Well-developed, well-nourished, no acute distress. Eyes: Pink conjunctiva, anicteric sclera. HEENT: Normocephalic, moist mucous membranes, clear oropharnyx.  Breasts: Patient requested exam be deferred today. Lungs: Clear to auscultation bilaterally. Heart: Regular rate and rhythm. No rubs, murmurs, or  gallops. Abdomen: Soft, nontender, nondistended. No organomegaly noted, normoactive bowel sounds. Musculoskeletal: No edema, cyanosis, or clubbing. Neuro: Alert, answering all questions appropriately. Cranial nerves grossly intact. Skin: No rashes or petechiae noted. Psych: Normal affect. Lymphatics: No cervical, calvicular, axillary or inguinal LAD.   LAB RESULTS:  Lab Results  Component Value Date   NA 141 02/02/2016   K 4.0 02/02/2016   CL 102 02/02/2016   CO2 32 02/02/2016   GLUCOSE 86 02/02/2016   BUN 14 02/02/2016   CREATININE 0.87 02/02/2016   CALCIUM 10.4 02/02/2016   PROT 7.8 02/02/2016   ALBUMIN 4.7 02/02/2016   AST 16 02/02/2016   ALT 12 02/02/2016   ALKPHOS 50 02/02/2016   BILITOT 0.4 02/02/2016    Lab Results  Component Value Date   WBC 6.9 02/02/2016   NEUTROABS 3.5 05/20/2012   HGB 12.4 02/02/2016   HCT 38.0 02/02/2016   MCV 81.2 02/02/2016   PLT 263.0 02/02/2016     STUDIES: Mm Digital Diagnostic Unilat L  Result Date: 05/25/2016 CLINICAL DATA:  Callback from screening mammogram EXAM: DIGITAL DIAGNOSTIC LEFT MAMMOGRAM WITH CAD COMPARISON:  Previous exam(s). ACR Breast Density Category b: There are scattered areas of fibroglandular density. FINDINGS: There is a 2.6 x 2.3 x 1.3 cm group of pleomorphic calcifications within the upper, slightly inner left breast, posterior depth. Mammographic images were processed with CAD. IMPRESSION: Suspicious left breast calcifications. RECOMMENDATION: Stereotactic guided left breast biopsy. I have discussed the findings and recommendations with the patient. Results were also provided in writing at the conclusion of the visit. If applicable, a reminder letter will be sent to the patient regarding the next appointment. BI-RADS CATEGORY  4: Suspicious. Electronically Signed   By: Pamelia Hoit M.D.   On: 05/25/2016 09:35   Mm Clip Placement Left  Result Date: 06/06/2016 CLINICAL DATA:  Post stereotactic core needle biopsy of  left breast upper inner quadrant calcifications. EXAM: DIAGNOSTIC LEFT MAMMOGRAM POST STEREOTACTIC BIOPSY COMPARISON:  Previous exam(s). FINDINGS: Mammographic images were obtained following stereotactic guided biopsy of left breast upper inner quadrant calcifications. Two-view mammography demonstrates presence of top hat shaped tissue marker within the biopsy site in appropriate radiographic position. Expected post biopsy changes are seen. IMPRESSION: Successful placement of top hat shaped tissue marker post stereotactic core needle biopsy of the left breast. Final Assessment: Post Procedure Mammograms for Marker Placement Electronically Signed   By: Fidela Salisbury M.D.   On: 06/06/2016 10:00   Mm Lt Breast Bx W Loc Dev 1st Lesion Image Bx Spec Stereo Guide  Addendum Date: 06/09/2016   ADDENDUM REPORT: 06/09/2016 10:01 ADDENDUM: Pathology of the left breast biopsy revealed BREAST, LEFT, UPPER INNER QUADRANT; STEREOTACTIC CORE BIOPSY: INVASIVE MAMMARY CARCINOMA, NO SPECIAL TYPE. - DUCTAL CARCINOMA IN SITU, INTERMEDIATE NUCLEAR GRADE WITH COMEDONECROSIS AND CALCIFICATIONS, POSSIBLY EXTENSIVE, SEE COMMENT. Histologic grade of invasive carcinoma: Grade 2. Lymphovascular invasion: Not identified. ER/PR/HER2: Immunohistochemistry will be performed on block A4, with reflex to [place] for HER2 2+. The results will be reported in an addendum. Comment: Multiple core  fragments contain both in situ and invasive carcinoma. The single largest invasive focus is 3 mm, but a larger tumor is not excluded. Correlation with imaging findings is recommended; an extensive intraductal component may be present. The definitive grade will be assigned on the excisional specimen. These findings were communicated to Francee Nodal in the [facility] on 06/07/16 at 9:37 AM. Read-back procedure was performed. This was found to be concordant by Dr. Kateri Plummer impression and notes. Recommendation:  Surgical and oncology referrals.  Results and recommendations were relayed to Dr. Jonathon Jordan nurse Lavella Lemons. After speaking with Dr. Lacinda Axon, she asked that the radiologist contact the patient and the nurse navigators for [facility] make the referral. Results were relayed to the patient by Dr. Brigitte Pulse by phone on 06/08/16. She stated she did well following the biopsy. All of her questions were answered. She was encouraged to call the [facility] with any further questions or concerns. Referrals were made with Dr. Bary Castilla for 06/09/16 at 10:30 AM and Dr. Grayland Ormond for 06/16/16 at 10:15 AM by Al Pimple, RN, nurse navigator. Both the patient and Dr. Jonathon Jordan office have been notified of the appointment information. Addendum by Jetta Lout, RRA on 06/09/16. Electronically Signed   By: Fidela Salisbury M.D.   On: 06/09/2016 10:01   Result Date: 06/09/2016 CLINICAL DATA:  Left breast upper inner quadrant calcifications seen on most recent diagnostic mammography. EXAM: LEFT BREAST STEREOTACTIC CORE NEEDLE BIOPSY COMPARISON:  Previous exams. FINDINGS: The patient and I discussed the procedure of stereotactic-guided biopsy including benefits and alternatives. We discussed the high likelihood of a successful procedure. We discussed the risks of the procedure including infection, bleeding, tissue injury, clip migration, and inadequate sampling. Informed written consent was given. The usual time out protocol was performed immediately prior to the procedure. Using sterile technique and 1% Lidocaine as local anesthetic, under stereotactic guidance, a 9 gauge vacuum assisted device was used to perform core needle biopsy of calcifications in the upper inner quadrant of the left breast using a superior approach. Specimen radiograph was performed showing presence of calcification. Specimens with calcifications are identified for pathology. At the conclusion of the procedure, a top hat shaped tissue marker clip was deployed into the biopsy cavity.  Follow-up 2-view mammogram was performed and dictated separately. IMPRESSION: Stereotactic-guided biopsy of left breast. No apparent complications. Electronically Signed: By: Fidela Salisbury M.D. On: 06/06/2016 09:55    ASSESSMENT: Clinical stage IB ER/PR positive, HER-2 negative invasive carcinoma of the upper inner quadrant of the left breast.  PLAN:   1. Clinical stage IB ER/PR positive, HER-2 negative invasive carcinoma of the upper inner quadrant of the left breast: Given patient's stage of disease have recommended that she proceed with surgery as initial treatment. If she undergoes lumpectomy, she will require adjuvant XRT. Will order Mammoprint to evaluate whether adjuvant chemotherapy is necessary. Given the ER/PR status of her tumor, she will benefit from an aromatase inhibitor for 5 years at the conclusion of her treatments. Patient will return to clinic 1-2 weeks after her surgery to discuss her final pathology results and additional treatment planning.  Approximately 45 minutes was spent in discussion of which greater than 50% was consultation.  Patient expressed understanding and was in agreement with this plan. She also understands that She can call clinic at any time with any questions, concerns, or complaints.   Cancer Staging Malignant neoplasm of upper-inner quadrant of left breast in female, estrogen receptor positive ([place]) Staging form:  Breast, AJCC 8th Edition - Clinical stage from 06/13/2016: Stage IB (cT2, cN0, cM0, G2, ER: Positive, PR: Positive, HER2: Negative) - Signed by Lloyd Huger, MD on 06/13/2016   Lloyd Huger, MD   06/16/2016 12:54 PM

## 2016-06-16 ENCOUNTER — Encounter: Payer: Self-pay | Admitting: Oncology

## 2016-06-16 ENCOUNTER — Inpatient Hospital Stay: Payer: 59 | Attending: Oncology | Admitting: Oncology

## 2016-06-16 VITALS — BP 163/97 | HR 62 | Temp 97.7°F | Ht 71.0 in | Wt 230.3 lb

## 2016-06-16 DIAGNOSIS — Z803 Family history of malignant neoplasm of breast: Secondary | ICD-10-CM | POA: Insufficient documentation

## 2016-06-16 DIAGNOSIS — Z79899 Other long term (current) drug therapy: Secondary | ICD-10-CM | POA: Diagnosis not present

## 2016-06-16 DIAGNOSIS — C50212 Malignant neoplasm of upper-inner quadrant of left female breast: Secondary | ICD-10-CM

## 2016-06-16 DIAGNOSIS — Z170 Estrogen receptor positive status [ER+]: Secondary | ICD-10-CM

## 2016-06-16 DIAGNOSIS — Z87891 Personal history of nicotine dependence: Secondary | ICD-10-CM | POA: Diagnosis not present

## 2016-06-16 DIAGNOSIS — I10 Essential (primary) hypertension: Secondary | ICD-10-CM | POA: Diagnosis not present

## 2016-06-16 DIAGNOSIS — E785 Hyperlipidemia, unspecified: Secondary | ICD-10-CM | POA: Diagnosis not present

## 2016-06-16 NOTE — Progress Notes (Signed)
  Oncology Nurse Navigator Documentation  Navigator Location: [facility]-Med Onc (06/16/16 1300) Referral date to RadOnc/MedOnc: 06/16/16 (06/16/16 1300) )Navigator Encounter Type: Clinic/MDC;Initial MedOnc (06/16/16 1300)                       Treatment Phase: Pre-Tx/Tx Discussion (06/16/16 1300) Barriers/Navigation Needs: Coordination of Care;Education (06/16/16 1300)       Coordination of Care: Appts (06/16/16 1300) Education Method: Verbal;Teach-back (06/16/16 1300)                Time Spent with Patient: 75 (06/16/16 1300)   Supported patient, and daughter Shane Crutch  At initial Med/Onc visit.  Patient to call Dr. Bary Castilla to schedule surgery, and will call back with appointment date/time.

## 2016-06-16 NOTE — Progress Notes (Signed)
Patient is here today for new patient exam. She did have some questions about radiation: how many cycles of radiation and how will this affect her daily functions?

## 2016-06-20 ENCOUNTER — Other Ambulatory Visit: Payer: Self-pay | Admitting: General Surgery

## 2016-06-20 ENCOUNTER — Telehealth: Payer: Self-pay | Admitting: General Surgery

## 2016-06-20 NOTE — Telephone Encounter (Signed)
The patient had called with plans to schedule for a left lumpectomy for her recently diagnosed breast cancer.  We reviewed the procedure including wire localization as the area was not visible on office ultrasound as well as sentinel node injection. We'll plan for wide excision and sentinel node biopsy in April 16. Anticipate she'll be off work about a week. She'll be contacted tomorrow with scheduling for pre-admit testing (sees no laboratory required from my point of view) CBC and comprehensive metabolic panel were normal in November.

## 2016-06-20 NOTE — Telephone Encounter (Signed)
Patient called back to schedule breast surgery,last seen by Dr Bary Castilla 06-09-16.please call pt on her cell # [phone].

## 2016-06-21 ENCOUNTER — Other Ambulatory Visit: Payer: Self-pay | Admitting: *Deleted

## 2016-06-21 ENCOUNTER — Telehealth: Payer: Self-pay | Admitting: *Deleted

## 2016-06-21 ENCOUNTER — Other Ambulatory Visit: Payer: Self-pay | Admitting: General Surgery

## 2016-06-21 DIAGNOSIS — C50212 Malignant neoplasm of upper-inner quadrant of left female breast: Secondary | ICD-10-CM

## 2016-06-21 DIAGNOSIS — Z170 Estrogen receptor positive status [ER+]: Principal | ICD-10-CM

## 2016-06-21 NOTE — Telephone Encounter (Signed)
-----   Message from Sherrie Sport sent at 06/21/2016  4:34 PM EDT ----- Have Ms. [place] check in at [facility] first on 4/16.  We have her set up to do the sentinel node first.  She should check in at the [place] at 7:45am that day.  Thanks so much,  Sam ----- Message ----- From: Dominga Ferry, CMA Sent: 06/21/2016  12:25 PM To: Lesly Rubenstein, LPN, #  The above patient is scheduled for surgery on 07-03-16 at 10:50 am. Can you please arrange for left breast needle loc and SLN and let me know what time patient will need to report to [facility]? Thanks.

## 2016-06-21 NOTE — Telephone Encounter (Signed)
Patient's surgery has been scheduled for 07-03-16 at [facility].   This patient is aware of date, time, and instructions.   Patient was instructed to call the office should she have further questions.

## 2016-06-23 ENCOUNTER — Other Ambulatory Visit: Payer: Self-pay | Admitting: General Surgery

## 2016-06-23 DIAGNOSIS — Z170 Estrogen receptor positive status [ER+]: Principal | ICD-10-CM

## 2016-06-23 DIAGNOSIS — C50212 Malignant neoplasm of upper-inner quadrant of left female breast: Secondary | ICD-10-CM

## 2016-06-26 ENCOUNTER — Encounter
Admission: RE | Admit: 2016-06-26 | Discharge: 2016-06-26 | Disposition: A | Discharge status: Home / Self Care | Payer: 59 | Source: Ambulatory Visit | Attending: General Surgery | Admitting: General Surgery

## 2016-06-26 DIAGNOSIS — Z01812 Encounter for preprocedural laboratory examination: Secondary | ICD-10-CM | POA: Insufficient documentation

## 2016-06-26 DIAGNOSIS — Z01810 Encounter for preprocedural cardiovascular examination: Secondary | ICD-10-CM | POA: Insufficient documentation

## 2016-06-26 DIAGNOSIS — I10 Essential (primary) hypertension: Secondary | ICD-10-CM | POA: Insufficient documentation

## 2016-06-26 HISTORY — DX: Anemia, unspecified: D64.9

## 2016-06-26 HISTORY — DX: Gastro-esophageal reflux disease without esophagitis: K21.9

## 2016-06-26 HISTORY — DX: Unspecified osteoarthritis, unspecified site: M19.90

## 2016-06-26 NOTE — Patient Instructions (Signed)
  Your procedure is scheduled on: 07-03-16  Report to [place] (2ND DESK ON RIGHT) @ 7:45 AM   Remember: Instructions that are not followed completely may result in serious medical risk, up to and including death, or upon the discretion of your surgeon and anesthesiologist your surgery may need to be rescheduled.    _x___ 1. Do not eat food or drink liquids after midnight. No gum chewing or hard candies.     __x__ 2. No Alcohol for 24 hours before or after surgery.   __x__3. No Smoking for 24 prior to surgery.   ____  4. Bring all medications with you on the day of surgery if instructed.    __x__ 5. Notify your doctor if there is any change in your medical condition     (cold, fever, infections).     Do not wear jewelry, make-up, hairpins, clips or nail polish.  Do not wear lotions, powders, or perfumes. You may wear deodorant.  Do not shave 48 hours prior to surgery. Men may shave face and neck.  Do not bring valuables to the hospital.    [facility] is not responsible for any belongings or valuables.               Contacts, dentures or bridgework may not be worn into surgery.  Leave your suitcase in the car. After surgery it may be brought to your room.  For patients admitted to the hospital, discharge time is determined by your treatment team.   Patients discharged the day of surgery will not be allowed to drive home.  You will need someone to drive you home and stay with you the night of your procedure.    Please read over the following fact sheets that you were given:   [facility] Preparing for Surgery and or MRSA Information   _x___ Take anti-hypertensive (unless it includes a diuretic), cardiac, seizure, asthma, anti-reflux and psychiatric medicines. These include:  1. METOPROLOL  2.  3.  4.  5.  6.  ____Fleets enema or Magnesium Citrate as directed.   _x___ Use CHG Soap or sage wipes as directed on instruction sheet   ____ Use inhalers on the day  of surgery and bring to hospital day of surgery  ____ Stop Metformin and Janumet 2 days prior to surgery.    ____ Take 1/2 of usual insulin dose the night before surgery and none on the morning     surgery.   ____ Follow recommendations from Cardiologist, Pulmonologist or PCP regarding stopping Aspirin, Coumadin, Pllavix ,Eliquis, Effient, or Pradaxa, and Pletal.  ____Stop Anti-inflammatories such as Advil, Aleve, Ibuprofen, Motrin, Naproxen, Naprosyn, Goodies powders or aspirin products. OK to take Tylenol    _x___ Stop supplements until after surgery-STOP FISH OIL AND VITAMIN E NOW   ____ Bring C-Pap to the hospital.

## 2016-06-27 ENCOUNTER — Encounter
Admission: RE | Admit: 2016-06-27 | Discharge: 2016-06-27 | Disposition: A | Discharge status: Home / Self Care | Payer: 59 | Source: Ambulatory Visit | Attending: General Surgery | Admitting: General Surgery

## 2016-06-27 DIAGNOSIS — Z01812 Encounter for preprocedural laboratory examination: Secondary | ICD-10-CM | POA: Diagnosis not present

## 2016-06-27 DIAGNOSIS — Z01810 Encounter for preprocedural cardiovascular examination: Secondary | ICD-10-CM | POA: Diagnosis present

## 2016-06-27 DIAGNOSIS — I10 Essential (primary) hypertension: Secondary | ICD-10-CM | POA: Diagnosis not present

## 2016-06-27 LAB — POTASSIUM: POTASSIUM: 3.6 mmol/L (ref 3.5–5.1)

## 2016-07-02 MED ORDER — FAMOTIDINE 20 MG PO TABS
20.0000 mg | ORAL_TABLET | Freq: Once | ORAL | Status: AC
  Administered 2016-07-03: 20 mg via ORAL

## 2016-07-03 ENCOUNTER — Ambulatory Visit: Payer: 59

## 2016-07-03 ENCOUNTER — Ambulatory Visit
Admission: RE | Admit: 2016-07-03 | Discharge: 2016-07-03 | Disposition: A | Discharge status: Home / Self Care | Payer: 59 | Source: Ambulatory Visit | Attending: General Surgery | Admitting: General Surgery

## 2016-07-03 ENCOUNTER — Ambulatory Visit: Payer: 59 | Admitting: Anesthesiology

## 2016-07-03 ENCOUNTER — Encounter
Admission: RE | Disposition: A | Discharge status: Home / Self Care | Payer: Self-pay | Source: Ambulatory Visit | Attending: General Surgery

## 2016-07-03 ENCOUNTER — Encounter: Payer: Self-pay | Admitting: *Deleted

## 2016-07-03 DIAGNOSIS — C50912 Malignant neoplasm of unspecified site of left female breast: Secondary | ICD-10-CM | POA: Diagnosis present

## 2016-07-03 DIAGNOSIS — Z170 Estrogen receptor positive status [ER+]: Principal | ICD-10-CM

## 2016-07-03 DIAGNOSIS — D0512 Intraductal carcinoma in situ of left breast: Secondary | ICD-10-CM | POA: Diagnosis not present

## 2016-07-03 DIAGNOSIS — D649 Anemia, unspecified: Secondary | ICD-10-CM | POA: Insufficient documentation

## 2016-07-03 DIAGNOSIS — I10 Essential (primary) hypertension: Secondary | ICD-10-CM | POA: Diagnosis not present

## 2016-07-03 DIAGNOSIS — C50212 Malignant neoplasm of upper-inner quadrant of left female breast: Secondary | ICD-10-CM

## 2016-07-03 DIAGNOSIS — Z87891 Personal history of nicotine dependence: Secondary | ICD-10-CM | POA: Diagnosis not present

## 2016-07-03 DIAGNOSIS — K219 Gastro-esophageal reflux disease without esophagitis: Secondary | ICD-10-CM | POA: Insufficient documentation

## 2016-07-03 HISTORY — PX: BREAST LUMPECTOMY WITH NEEDLE LOCALIZATION: SHX5759

## 2016-07-03 HISTORY — PX: BREAST LUMPECTOMY: SHX2

## 2016-07-03 HISTORY — PX: BREAST LUMPECTOMY WITH SENTINEL LYMPH NODE BIOPSY: SHX5597

## 2016-07-03 SURGERY — BREAST LUMPECTOMY WITH SENTINEL LYMPH NODE BX
Anesthesia: General | Laterality: Left | Wound class: Clean

## 2016-07-03 MED ORDER — METHYLENE BLUE 0.5 % INJ SOLN
INTRAVENOUS | Status: AC
  Filled 2016-07-03: qty 10

## 2016-07-03 MED ORDER — FAMOTIDINE 20 MG PO TABS
ORAL_TABLET | ORAL | Status: AC
  Filled 2016-07-03: qty 1

## 2016-07-03 MED ORDER — FENTANYL CITRATE (PF) 100 MCG/2ML IJ SOLN
INTRAMUSCULAR | Status: AC
  Administered 2016-07-03: 25 ug via INTRAVENOUS
  Filled 2016-07-03: qty 2

## 2016-07-03 MED ORDER — METHYLENE BLUE 0.5 % INJ SOLN
INTRAVENOUS | Status: DC | PRN
  Administered 2016-07-03: 5 mL via INTRADERMAL

## 2016-07-03 MED ORDER — ACETAMINOPHEN 10 MG/ML IV SOLN
INTRAVENOUS | Status: AC
  Filled 2016-07-03: qty 100

## 2016-07-03 MED ORDER — KETOROLAC TROMETHAMINE 30 MG/ML IJ SOLN
INTRAMUSCULAR | Status: DC | PRN
  Administered 2016-07-03: 30 mg via INTRAVENOUS

## 2016-07-03 MED ORDER — HYDROCODONE-ACETAMINOPHEN 5-325 MG PO TABS: 1.0000 | ORAL_TABLET | ORAL | 0 refills | Status: DC | PRN

## 2016-07-03 MED ORDER — PROPOFOL 10 MG/ML IV BOLUS
INTRAVENOUS | Status: DC | PRN
  Administered 2016-07-03: 160 mg via INTRAVENOUS

## 2016-07-03 MED ORDER — FENTANYL CITRATE (PF) 100 MCG/2ML IJ SOLN
25.0000 ug | INTRAMUSCULAR | Status: AC | PRN
  Administered 2016-07-03 (×6): 25 ug via INTRAVENOUS

## 2016-07-03 MED ORDER — LACTATED RINGERS IV SOLN
INTRAVENOUS | Status: DC | PRN
  Administered 2016-07-03: 10:00:00 via INTRAVENOUS

## 2016-07-03 MED ORDER — ONDANSETRON HCL 4 MG/2ML IJ SOLN
INTRAMUSCULAR | Status: DC | PRN
  Administered 2016-07-03: 4 mg via INTRAVENOUS

## 2016-07-03 MED ORDER — MIDAZOLAM HCL 2 MG/2ML IJ SOLN
INTRAMUSCULAR | Status: DC | PRN
  Administered 2016-07-03: 2 mg via INTRAVENOUS

## 2016-07-03 MED ORDER — TECHNETIUM TC 99M SULFUR COLLOID FILTERED
738.0000 | Freq: Once | INTRAVENOUS | Status: AC | PRN
  Administered 2016-07-03: 738 via INTRADERMAL

## 2016-07-03 MED ORDER — NALOXONE HCL 2 MG/2ML IJ SOSY
PREFILLED_SYRINGE | INTRAMUSCULAR | Status: AC
  Administered 2016-07-03: 2 mg
  Filled 2016-07-03: qty 2

## 2016-07-03 MED ORDER — LACTATED RINGERS IV SOLN
INTRAVENOUS | Status: DC
  Administered 2016-07-03: 10:00:00 via INTRAVENOUS

## 2016-07-03 MED ORDER — LIDOCAINE HCL (CARDIAC) 20 MG/ML IV SOLN
INTRAVENOUS | Status: DC | PRN
  Administered 2016-07-03: 50 mg via INTRAVENOUS

## 2016-07-03 MED ORDER — ACETAMINOPHEN 10 MG/ML IV SOLN
INTRAVENOUS | Status: DC | PRN
  Administered 2016-07-03: 1000 mg via INTRAVENOUS

## 2016-07-03 MED ORDER — ONDANSETRON HCL 4 MG/2ML IJ SOLN
4.0000 mg | Freq: Once | INTRAMUSCULAR | Status: AC | PRN
  Administered 2016-07-03: 4 mg via INTRAVENOUS

## 2016-07-03 MED ORDER — ONDANSETRON HCL 4 MG/2ML IJ SOLN
INTRAMUSCULAR | Status: AC
Start: 2016-07-03 — End: 2016-07-03
  Administered 2016-07-03: 4 mg via INTRAVENOUS
  Filled 2016-07-03: qty 2

## 2016-07-03 MED ORDER — EPHEDRINE SULFATE 50 MG/ML IJ SOLN
INTRAMUSCULAR | Status: DC | PRN
  Administered 2016-07-03: 20 mg via INTRAVENOUS

## 2016-07-03 MED ORDER — BUPIVACAINE-EPINEPHRINE (PF) 0.5% -1:200000 IJ SOLN
INTRAMUSCULAR | Status: DC | PRN
  Administered 2016-07-03: 30 mL

## 2016-07-03 MED ORDER — FENTANYL CITRATE (PF) 100 MCG/2ML IJ SOLN
INTRAMUSCULAR | Status: AC
  Filled 2016-07-03: qty 2

## 2016-07-03 MED ORDER — BUPIVACAINE-EPINEPHRINE (PF) 0.5% -1:200000 IJ SOLN
INTRAMUSCULAR | Status: AC
  Filled 2016-07-03: qty 30

## 2016-07-03 MED ORDER — MIDAZOLAM HCL 2 MG/2ML IJ SOLN
INTRAMUSCULAR | Status: AC
  Filled 2016-07-03: qty 2

## 2016-07-03 MED ORDER — NALOXONE HCL 2 MG/2ML IJ SOSY: 0.4000 mg | PREFILLED_SYRINGE | INTRAMUSCULAR | Status: DC | PRN

## 2016-07-03 MED ORDER — FENTANYL CITRATE (PF) 100 MCG/2ML IJ SOLN
INTRAMUSCULAR | Status: DC | PRN
  Administered 2016-07-03 (×2): 25 ug via INTRAVENOUS
  Administered 2016-07-03: 50 ug via INTRAVENOUS

## 2016-07-03 SURGICAL SUPPLY — 50 items
BANDAGE ELASTIC 6 LF NS (GAUZE/BANDAGES/DRESSINGS) ×2 IMPLANT
BLADE SURG 15 STRL SS SAFETY (BLADE) ×4 IMPLANT
BNDG GAUZE 4.5X4.1 6PLY STRL (MISCELLANEOUS) ×2 IMPLANT
BULB RESERV EVAC DRAIN JP 100C (MISCELLANEOUS) IMPLANT
CANISTER SUCT 1200ML W/VALVE (MISCELLANEOUS) ×2 IMPLANT
CHLORAPREP W/TINT 26ML (MISCELLANEOUS) ×2 IMPLANT
CNTNR SPEC 2.5X3XGRAD LEK (MISCELLANEOUS) ×1
CONT SPEC 4OZ STER OR WHT (MISCELLANEOUS) ×1
CONTAINER SPEC 2.5X3XGRAD LEK (MISCELLANEOUS) ×1 IMPLANT
COVER PROBE FLX POLY STRL (MISCELLANEOUS) ×2 IMPLANT
DEVICE DUBIN SPECIMEN MAMMOGRA (MISCELLANEOUS) ×2 IMPLANT
DRAIN CHANNEL JP 15F RND 16 (MISCELLANEOUS) IMPLANT
DRAPE LAPAROTOMY TRNSV 106X77 (MISCELLANEOUS) ×2 IMPLANT
DRSG TELFA 3X8 NADH (GAUZE/BANDAGES/DRESSINGS) ×2 IMPLANT
ELECT CAUTERY BLADE TIP 2.5 (TIP) ×2
ELECT REM PT RETURN 9FT ADLT (ELECTROSURGICAL) ×2
ELECTRODE CAUTERY BLDE TIP 2.5 (TIP) ×1 IMPLANT
ELECTRODE REM PT RTRN 9FT ADLT (ELECTROSURGICAL) ×1 IMPLANT
GAUZE FLUFF 18X24 1PLY STRL (GAUZE/BANDAGES/DRESSINGS) ×2 IMPLANT
GAUZE SPONGE 4X4 12PLY STRL (GAUZE/BANDAGES/DRESSINGS) ×2 IMPLANT
GLOVE BIO SURGEON STRL SZ7.5 (GLOVE) ×2 IMPLANT
GLOVE INDICATOR 8.0 STRL GRN (GLOVE) ×2 IMPLANT
GOWN STRL REUS W/ TWL LRG LVL3 (GOWN DISPOSABLE) ×2 IMPLANT
GOWN STRL REUS W/TWL LRG LVL3 (GOWN DISPOSABLE) ×2
HARMONIC SCALPEL FOCUS (MISCELLANEOUS) ×2 IMPLANT
KIT RM TURNOVER STRD PROC AR (KITS) ×2 IMPLANT
LABEL OR SOLS (LABEL) ×2 IMPLANT
MARGIN MAP 10MM (MISCELLANEOUS) ×2 IMPLANT
NDL SAFETY 22GX1.5 (NEEDLE) ×2 IMPLANT
NEEDLE HYPO 25X1 1.5 SAFETY (NEEDLE) ×4 IMPLANT
PACK BASIN MINOR ARMC (MISCELLANEOUS) ×2 IMPLANT
SHEARS FOC LG CVD HARMONIC 17C (MISCELLANEOUS) IMPLANT
SLEVE PROBE SENORX GAMMA FIND (MISCELLANEOUS) ×2 IMPLANT
STRIP CLOSURE SKIN 1/2X4 (GAUZE/BANDAGES/DRESSINGS) ×2 IMPLANT
SUT ETHILON 3-0 FS-10 30 BLK (SUTURE) ×2
SUT SILK 2 0 (SUTURE) ×1
SUT SILK 2-0 18XBRD TIE 12 (SUTURE) ×1 IMPLANT
SUT VIC AB 2-0 CT1 27 (SUTURE) ×2
SUT VIC AB 2-0 CT1 TAPERPNT 27 (SUTURE) ×2 IMPLANT
SUT VIC AB 3-0 SH 27 (SUTURE) ×2
SUT VIC AB 3-0 SH 27X BRD (SUTURE) ×2 IMPLANT
SUT VIC AB 4-0 FS2 27 (SUTURE) ×4 IMPLANT
SUT VICRYL+ 3-0 144IN (SUTURE) ×2 IMPLANT
SUTURE EHLN 3-0 FS-10 30 BLK (SUTURE) ×1 IMPLANT
SWABSTK COMLB BENZOIN TINCTURE (MISCELLANEOUS) ×2 IMPLANT
SYR BULB IRRIG 60ML STRL (SYRINGE) ×2 IMPLANT
SYR CONTROL 10ML (SYRINGE) ×2 IMPLANT
SYRINGE 10CC LL (SYRINGE) ×2 IMPLANT
TAPE TRANSPORE STRL 2 31045 (GAUZE/BANDAGES/DRESSINGS) IMPLANT
WATER STERILE IRR 1000ML POUR (IV SOLUTION) ×2 IMPLANT

## 2016-07-03 NOTE — Op Note (Signed)
Preoperative diagnosis: Invasive cancer of the left breast.  Postoperative diagnosis: Same.  Operative procedure: Left breast wide excision with mastoplasty, sentinel node biopsy.  Operating surgeon: Ollen Bowl, M.D.  Anesthesia: Gen. by LMA, Marcaine 0.5% with 1-200,000 epinephrine, 30 mL.  Estimated blood loss: 5 mL.  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 Imaging suggested a 1-1/2-2 cm area of microcalcifications suggestive for DCIS. She underwent needle localization prior to the procedure and his plan for wide excision and sentinel node biopsy. Technetium sulfur colloid was injected prior to the procedure.  Operative note: With the patient under general anesthesia the breast was taped medially and inferiorly to provide better exposure the upper inner quadrant area. 5 mL of one half strength methylene blue was injected in subareolar plexus after skin prepped with alcohol. The breast chest neck closed and prepped with ChloraPrep and draped. Ultrasound was used to identify the tract of the localizing wire. A 6 x 6 x 5 cm block of tissue including the pectoralis fascia was resected from the upper inner quadrant of the left breast. Specimen radiograph was obtained after orientation and this showed about 1.2 cm margin between the most medial microcalcifications in the medial border. The other areas were widely negative radiographically. Gross exam from pathology was clear.  While pathology was pending attention was turned to the axilla. The node cecum was used to identify the area of increased uptake in the inferior aspect of the axillary envelope. Local anesthetic was infiltrated as had been done at the surgical site in the breast for postoperative analgesia. The skin was incised sharply and the remaining dissection completed with electrocautery. The axillary embolus was open. 2 hot  nodes were resected and sent as #1 and 2. These were sent together. Dissecting more medially and inferiorly a hot blue lymph node was identified and this was sent as sentinel node #3, in actuality actually the first sentinel node based on the blue stain.  Scanning to the axilla after these nodes were removed showed background counts less than 10% of the node count. The area was closed in layers with 2-0 Vicryl figure-of-eight sutures. The skin was closed with a running 4-0 Vicryl septic suture.  Attention was turned towards closure of the wide excision site. Breast parenchyma was elevated off the underlying pectoralis muscle circumferentially. This allowed re-approximation of the breast parenchyma with minimal proximal/medial dimpling. The pectoralis fashion was approximated with interrupted 2-0 Vicryl sutures. The deep adipose tissue was approximately in the similar fashion. The more superficial adipose tissue was approximated with interrupted 2-0 Vicryl simple sutures. The skin was closed with a running 4-0 Vicryl subcuticular suture.  Benzoin, Steri-Strips and Telfa dressing were applied. Surgical bra and fluff gauze were placed.  The patient tolerated the procedure well and was taken to recovery in stable condition.

## 2016-07-03 NOTE — Transfer of Care (Signed)
Immediate Anesthesia Transfer of Care Note  Patient: Olivia Kirk  Procedure(s) Performed: Procedure(s): BREAST LUMPECTOMY WITH SENTINEL LYMPH NODE BX (Left) BREAST LUMPECTOMY WITH NEEDLE LOCALIZATION (Left)  Patient Location: PACU  Anesthesia Type:General  Level of Consciousness: awake and alert   Airway & Oxygen Therapy: Patient Spontanous Breathing and Patient connected to nasal cannula oxygen  Post-op Assessment: Report given to RN  Post vital signs: Reviewed and stable  Last Vitals:  Vitals:   07/03/16 0906  BP: (!) 179/98  Pulse: (!) 56  Resp: 16  Temp: 36.1 C    Last Pain:  Vitals:   07/03/16 0906  TempSrc: Tympanic  PainSc: 2          Complications: No apparent anesthesia complications

## 2016-07-03 NOTE — Discharge Instructions (Signed)

## 2016-07-03 NOTE — Anesthesia Procedure Notes (Signed)
Procedure Name: LMA Insertion Date/Time: 07/03/2016 9:54 AM Performed by: VVZSMOL, Damiyah Ditmars Patient Re-evaluated:Patient Re-evaluated prior to inductionOxygen Delivery Method: Circle system utilized Preoxygenation: Pre-oxygenation with 100% oxygen Intubation Type: IV induction LMA Size: 4.5 Number of attempts: 1 Placement Confirmation: breath sounds checked- equal and bilateral,  CO2 detector and positive ETCO2 Tube secured with: Tape

## 2016-07-03 NOTE — Anesthesia Preprocedure Evaluation (Signed)
Anesthesia Evaluation  Patient identified by MRN, date of birth, ID band Patient awake    Reviewed: Allergy & Precautions, H&P , NPO status , Patient's Chart, lab work & pertinent test results, reviewed documented beta blocker date and time   Airway Mallampati: II  TM Distance: >3 FB Neck ROM: full    Dental  (+) Teeth Intact   Pulmonary neg pulmonary ROS, former smoker,    Pulmonary exam normal        Cardiovascular Exercise Tolerance: Good hypertension, negative cardio ROS Normal cardiovascular exam Rate:Normal     Neuro/Psych negative neurological ROS  negative psych ROS   GI/Hepatic negative GI ROS, Neg liver ROS, GERD  Medicated,  Endo/Other  negative endocrine ROS  Renal/GU negative Renal ROS  negative genitourinary   Musculoskeletal   Abdominal   Peds  Hematology negative hematology ROS (+) anemia ,   Anesthesia Other Findings   Reproductive/Obstetrics negative OB ROS                             Anesthesia Physical Anesthesia Plan  ASA: II  Anesthesia Plan: General LMA   Post-op Pain Management:    Induction:   Airway Management Planned:   Additional Equipment:   Intra-op Plan:   Post-operative Plan:   Informed Consent: I have reviewed the patients History and Physical, chart, labs and discussed the procedure including the risks, benefits and alternatives for the proposed anesthesia with the patient or authorized representative who has indicated his/her understanding and acceptance.     Plan Discussed with: CRNA  Anesthesia Plan Comments:         Anesthesia Quick Evaluation

## 2016-07-03 NOTE — Anesthesia Post-op Follow-up Note (Cosign Needed)
Anesthesia QCDR form completed.        

## 2016-07-03 NOTE — H&P (Signed)
No change in clinical history or exam. For wide excision, SLN biopsy.

## 2016-07-04 LAB — SURGICAL PATHOLOGY

## 2016-07-04 NOTE — Anesthesia Postprocedure Evaluation (Signed)
Anesthesia Post Note  Patient: Olivia Kirk  Procedure(s) Performed: Procedure(s) (LRB): BREAST LUMPECTOMY WITH SENTINEL LYMPH NODE BX (Left) BREAST LUMPECTOMY WITH NEEDLE LOCALIZATION (Left)  Patient location during evaluation: PACU Anesthesia Type: General Level of consciousness: awake and alert Pain management: pain level controlled Vital Signs Assessment: post-procedure vital signs reviewed and stable Respiratory status: spontaneous breathing, nonlabored ventilation, respiratory function stable and patient connected to nasal cannula oxygen Cardiovascular status: blood pressure returned to baseline and stable Postop Assessment: no signs of nausea or vomiting Anesthetic complications: no     Last Vitals:  Vitals:   07/03/16 1350 07/03/16 1416  BP: (!) 182/89 (!) 158/92  Pulse: 65 64  Resp: 14 17  Temp: (!) 35.8 C 36.6 C    Last Pain:  Vitals:   07/04/16 0838  TempSrc:   PainSc: 1                  Molli Barrows

## 2016-07-10 ENCOUNTER — Encounter: Payer: Self-pay | Admitting: General Surgery

## 2016-07-10 ENCOUNTER — Inpatient Hospital Stay: Payer: Self-pay

## 2016-07-10 ENCOUNTER — Ambulatory Visit (INDEPENDENT_AMBULATORY_CARE_PROVIDER_SITE_OTHER): Payer: 59 | Admitting: General Surgery

## 2016-07-10 VITALS — BP 148/72 | HR 68 | Resp 14 | Ht 71.0 in | Wt 233.0 lb

## 2016-07-10 DIAGNOSIS — Z170 Estrogen receptor positive status [ER+]: Secondary | ICD-10-CM | POA: Diagnosis not present

## 2016-07-10 DIAGNOSIS — C50212 Malignant neoplasm of upper-inner quadrant of left female breast: Secondary | ICD-10-CM

## 2016-07-10 NOTE — Progress Notes (Signed)
Patient ID: Olivia Kirk, female   DOB: [birth year], 61 y.o.   MRN: [id number]  Chief Complaint  Patient presents with  . Routine Post Op    left breast excision     HPI Olivia Kirk is a 61 y.o. female here today folr her post op left breast excision done on 07/03/2016. Patient states she is doing well.  HPI  Past Medical History:  Diagnosis Date  . Anemia    H/O  . Arthritis    FINGER MIDDLE FINGER RIGHT HAND, LEFT KNEE  . Breast cancer ([place]) 06/06/2016   LEFT: T1b (6 mm), N0, 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  Marland Kitchen GERD (gastroesophageal reflux disease)    RARE  . Hyperlipidemia   . Hypertension     Past Surgical History:  Procedure Laterality Date  . BREAST BIOPSY Left 06/06/2016   left stereo. invasive mammary carcinoma  . BREAST LUMPECTOMY Left 07/03/2016   invasive mammary carcinoma  . BREAST LUMPECTOMY WITH NEEDLE LOCALIZATION Left 07/03/2016   Procedure: BREAST LUMPECTOMY WITH NEEDLE LOCALIZATION;  Surgeon: Robert Bellow, MD;  Location: [facility] ORS;  Service: General;  Laterality: Left;  . BREAST LUMPECTOMY WITH SENTINEL LYMPH NODE BIOPSY Left 07/03/2016   Procedure: BREAST LUMPECTOMY WITH SENTINEL LYMPH NODE BX;  Surgeon: Robert Bellow, MD;  Location: [facility] ORS;  Service: General;  Laterality: Left;  . hemrrhoid    . TUBAL LIGATION      Family History  Problem Relation Age of Onset  . Hypertension Mother   . Cancer Father   . Heart disease Father   . Hypertension Maternal Grandmother   . Stroke Maternal Grandmother   . Breast cancer Paternal Aunt     66    Social History Social History  Substance Use Topics  . Smoking status: Former Smoker    Packs/day: 0.25    Years: 6.00    Types: Cigarettes    Quit date: 05/07/1987  . Smokeless tobacco: Never Used  . Alcohol use 0.0 oz/week     Comment: Occassionally    Allergies  Allergen Reactions  . Guaifenesin & Derivatives Itching, Dermatitis and Rash     Current Outpatient Prescriptions  Medication Sig Dispense Refill  . Ascorbic Acid (VITAMIN C PO) Take 1 tablet by mouth daily.    . Cholecalciferol (VITAMIN D-3) 5000 units TABS Take 5,000 Units by mouth daily.    . hydrochlorothiazide (HYDRODIURIL) 25 MG tablet Take 1 tablet (25 mg total) by mouth daily. 90 tablet 3  . HYDROcodone-acetaminophen (NORCO) 5-325 MG tablet Take 1-2 tablets by mouth every 4 (four) hours as needed for moderate pain. 30 tablet 0  . metoprolol succinate (TOPROL-XL) 100 MG 24 hr tablet Take 1 tablet (100 mg total) by mouth daily. Take with or immediately following a meal. (Patient taking differently: Take 100 mg by mouth every morning. Take with or immediately following a meal.) 90 tablet 3  . Multiple Vitamins-Minerals (ALIVE WOMENS 50+ PO) Take 1 tablet by mouth daily.    . naproxen sodium (ANAPROX) 220 MG tablet Take 440 mg by mouth daily as needed.    . Omega-3 Fatty Acids (FISH OIL TRIPLE STRENGTH PO) Take 1 capsule by mouth daily.    . rosuvastatin (CRESTOR) 20 MG tablet Take 1 tablet (20 mg total) by mouth daily. (Patient taking differently: Take 20 mg by mouth every morning. ) 90 tablet 3  . vitamin B-12 (CYANOCOBALAMIN) 500 MCG tablet Take 500 mcg by mouth  daily.    . vitamin E 400 UNIT capsule Take 400 Units by mouth daily.     No current facility-administered medications for this visit.     Review of Systems Review of Systems  Constitutional: Negative.   Respiratory: Negative.   Cardiovascular: Negative.     Blood pressure (!) 148/72, pulse 68, resp. rate 14, height '5\' 11"'$  (1.803 m), weight 233 lb (105.7 kg).  Physical Exam Physical Exam  Constitutional: She is oriented to person, place, and time. She appears well-developed and well-nourished.  Cardiovascular: Normal rate, regular rhythm and normal heart sounds.   Pulmonary/Chest: Effort normal and breath sounds normal. Left breast exhibits no inverted nipple, no mass, no nipple discharge, no  skin change and no tenderness.    Left breast incision site is clean and healing well.  Neurological: She is alert and oriented to person, place, and time.  Skin: Skin is warm and dry.    Data Reviewed Ultrasound examination of the left breast was completed to determine if the patient would be a candidate for accelerated partial breast radiation based on anatomy.  There is a seroma cavity  2.4 cm below the skin measuring 2.4 x 4.9 x 5.2 cm.  Pathology:  T1b, N0, extensive DCIS. (18 mm).  Margins: 3 mm minimum. Nodes negative.  Assessment    Doing well status post management of a T1b ER/PR positive, HER-2/neu negative invasive mammary carcinoma with an area of extensive DCIS.    Plan    Potential candidate for accelerated partial breast radiation, only caveat is the identification of extensive DCIS.  Doing well post wide excision and sentinel node biopsy.  Arrangements have been made for evaluation by radiation oncology. After their assessment antiestrogen therapy can be initiated by Dr. Grayland Ormond.     Patient to return in  HPI, Physical Exam, Assessment and Plan have been scribed under the direction and in the presence of Hervey Ard, MD.  Gaspar Cola, CMA  I have completed the exam and reviewed the above documentation for accuracy and completeness.  I agree with the above.  Haematologist has been used and any errors in dictation or transcription are unintentional.  Hervey Ard, M.D., F.A.C.S.  Robert Bellow 07/11/2016, 8:04 AM   Patient has been scheduled for an appointment at the [facility] with Dr. Baruch Gouty for 07-13-16 at 11 am.  Olivia Kirk, CMA

## 2016-07-10 NOTE — Patient Instructions (Addendum)
You have been scheduled for an appointment at the [facility] with Dr. Noreene Filbert for 07-13-16 at 11 am. If you need to contact their office for any reason, please call [phone]. Their address is [address] [place] [address].

## 2016-07-11 ENCOUNTER — Encounter: Payer: Self-pay | Admitting: General Surgery

## 2016-07-13 ENCOUNTER — Institutional Professional Consult (permissible substitution): Payer: 59 | Admitting: Radiation Oncology

## 2016-07-13 NOTE — Progress Notes (Signed)
  Oncology Nurse Navigator Documentation  Navigator Location: [facility] (07/13/16 1000)   )Navigator Encounter Type: Telephone (post-op) (07/13/16 1000) Telephone: [facility] (07/13/16 1000)     Surgery Date: 07/03/16 (07/13/16 1000)             Patient Visit Type: Follow-up (07/13/16 1000) Treatment Phase: Active Tx (07/13/16 1000) Barriers/Navigation Needs: Education (07/13/16 1000) Education: Pain/ Symptom Management (07/13/16 1000)             ut has some s           Time Spent with Patient: 15 (07/13/16 1000)   Phoned patient post surgery.  States doing well. Having some "soreness under arm at incision" where bra is rubbing .  To try a soft cloth between bra and under arm, and Tylenol for discomfort.  Instructed to notify Dr. Bary Castilla if she has any concerns.  Confirmed appointment with Radiation Oncologist on 07/19/16 at 10:00

## 2016-07-19 ENCOUNTER — Ambulatory Visit
Admission: RE | Admit: 2016-07-19 | Discharge: 2016-07-19 | Disposition: A | Discharge status: Home / Self Care | Payer: 59 | Source: Ambulatory Visit | Attending: Radiation Oncology | Admitting: Radiation Oncology

## 2016-07-19 ENCOUNTER — Telehealth: Payer: Self-pay | Admitting: *Deleted

## 2016-07-19 ENCOUNTER — Encounter: Payer: Self-pay | Admitting: Radiation Oncology

## 2016-07-19 VITALS — BP 159/94 | HR 71 | Temp 96.9°F | Resp 18 | Ht 72.0 in | Wt 234.1 lb

## 2016-07-19 DIAGNOSIS — Z510 Encounter for antineoplastic radiation therapy: Secondary | ICD-10-CM | POA: Diagnosis not present

## 2016-07-19 DIAGNOSIS — Z803 Family history of malignant neoplasm of breast: Secondary | ICD-10-CM | POA: Diagnosis not present

## 2016-07-19 DIAGNOSIS — K219 Gastro-esophageal reflux disease without esophagitis: Secondary | ICD-10-CM | POA: Insufficient documentation

## 2016-07-19 DIAGNOSIS — Z87891 Personal history of nicotine dependence: Secondary | ICD-10-CM | POA: Diagnosis not present

## 2016-07-19 DIAGNOSIS — I10 Essential (primary) hypertension: Secondary | ICD-10-CM | POA: Diagnosis not present

## 2016-07-19 DIAGNOSIS — E785 Hyperlipidemia, unspecified: Secondary | ICD-10-CM | POA: Diagnosis not present

## 2016-07-19 DIAGNOSIS — Z809 Family history of malignant neoplasm, unspecified: Secondary | ICD-10-CM | POA: Insufficient documentation

## 2016-07-19 DIAGNOSIS — Z79899 Other long term (current) drug therapy: Secondary | ICD-10-CM | POA: Insufficient documentation

## 2016-07-19 DIAGNOSIS — Z170 Estrogen receptor positive status [ER+]: Secondary | ICD-10-CM | POA: Insufficient documentation

## 2016-07-19 DIAGNOSIS — C50212 Malignant neoplasm of upper-inner quadrant of left female breast: Secondary | ICD-10-CM | POA: Insufficient documentation

## 2016-07-19 MED ORDER — CEFADROXIL 500 MG PO CAPS: 500.0000 mg | ORAL_CAPSULE | Freq: Two times a day (BID) | ORAL | 0 refills | Status: DC

## 2016-07-19 NOTE — Telephone Encounter (Signed)
Mammosite schedule reviewed with the patient Placement   07-24-16   at [facility] at 8:30 Scan 07-26-16 Treat May 9, 14-17 Aware the [facility] will be calling her for more details Aware of ATB and directions reviewed. Aware no showers and to wear her bra while mammosite in place.

## 2016-07-19 NOTE — Telephone Encounter (Signed)
Mammosite schedule reviewed with the patient Treat May 10, 14-17 Aware the [facility] will be calling her for more details Aware of ATB and directions reviewed. Aware no showers and to wear her bra while mammosite in place. Pt agrees.

## 2016-07-19 NOTE — Progress Notes (Signed)
  Oncology Nurse Navigator Documentation  Navigator Location: [facility] (07/19/16 1200) Referral date to RadOnc/MedOnc: 07/19/16 (07/19/16 1200) )Navigator Encounter Type: Clinic/[facility]C (07/19/16 1200) Telephone: Clinic/[facility]C Follow-up (07/19/16 1200)                   Patient Visit Type: RadOnc;Initial (07/19/16 1200) Treatment Phase: Active Tx (07/19/16 1200) Barriers/Navigation Needs: Education;Coordination of Care (07/19/16 1200) Education: Preparing for Upcoming Surgery/ Treatment (07/19/16 1200)     Coordination of Care: Appts (07/19/16 1200) Education Method: Verbal;Teach-back;Demonstration (07/19/16 1200)                Time Spent with Patient: 45 (07/19/16 1200)  Supported patient ,and daughter at initial Rad/Onc visit.  Plans for Mammosite.  Contacted Mickel Baas, and Dr. Grayland Ormond to schedule follow-up appointment to discuss Mammoprint results, and antihormonal therapy.

## 2016-07-19 NOTE — Consult Note (Signed)
NEW PATIENT EVALUATION  Name: Olivia Kirk  MRN: [id number]  Date:   07/19/2016     DOB: [birth year]   This 61 y.o. 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 Breast Cancer    Pt is here for initial consultation of breast cancer.      DIAGNOSIS: The encounter diagnosis was Malignant neoplasm of upper-inner quadrant of left breast in female, estrogen receptor positive ([place]).   PREVIOUS INVESTIGATIONS:  Mammograms ultrasound reviewed Pathology reports reviewed Clinical notes reviewed  HPI: Patient is a 60 year old female who presented with an amylase abnormal mammogram of her left breast showing an area of pleomorphic calcifications within the upper inner quadrant measuring approximately 2.6 x 2.3 cm. She underwent biopsy which was positive for ER/PR positive invasive mammary carcinoma overall grade 2. She would not have a wide local excision and sentinel node biopsy showing a 6 mm area of invasive mammary carcinoma. 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 Tumor was ER/PR positive. Margins were clear by 3 mm. 3 sentinel lymph nodes were negative for malignancy. She has done well postoperatively. Has been seen by medical oncology and I believe a MammaPrint has been ordered. She seen today for consideration of radiation. She is doing well. She still somewhat tender in the left breast and nervous otherwise specifically to denies cough or bone pain.  PLANNED TREATMENT REGIMEN: Accelerated partial breast radiation  PAST MEDICAL HISTORY:  has a past medical history of Anemia; Arthritis; Breast cancer ([place]) (06/06/2016); GERD (gastroesophageal reflux disease); Hyperlipidemia; and Hypertension.     PAST SURGICAL HISTORY:  Past Surgical History:  Procedure Laterality Date  . BREAST BIOPSY Left 06/06/2016   left stereo. invasive mammary carcinoma  . BREAST LUMPECTOMY Left 07/03/2016   invasive mammary carcinoma  . BREAST LUMPECTOMY WITH NEEDLE LOCALIZATION Left 07/03/2016   Procedure: BREAST LUMPECTOMY WITH NEEDLE LOCALIZATION;  Surgeon: Robert Bellow, MD;  Location: [facility] ORS;  Service: General;  Laterality: Left;  . BREAST LUMPECTOMY WITH SENTINEL LYMPH NODE BIOPSY Left 07/03/2016   Procedure: BREAST LUMPECTOMY WITH SENTINEL LYMPH NODE BX;  Surgeon: Robert Bellow, MD;  Location: [facility] ORS;  Service: General;  Laterality: Left;  . hemrrhoid    . TUBAL LIGATION      FAMILY HISTORY: family history includes Breast cancer in her paternal aunt; Cancer in her father; Heart disease in her father; Hypertension in her maternal grandmother and mother; Stroke in her maternal grandmother.  SOCIAL HISTORY:  reports that she quit smoking about 29 years ago. Her smoking use included Cigarettes. She has a 1.50 pack-year smoking history. She has never used smokeless tobacco. She reports that she drinks alcohol. She reports that she does not use drugs.  ALLERGIES: Guaifenesin & derivatives  MEDICATIONS:  Current Outpatient Prescriptions  Medication Sig Dispense Refill  . Ascorbic Acid (VITAMIN C PO) Take 1 tablet by mouth daily.    . Cholecalciferol (VITAMIN D-3) 5000 units TABS Take 5,000 Units by mouth daily.    . hydrochlorothiazide (HYDRODIURIL) 25 MG tablet Take 1 tablet (25 mg total) by mouth daily. 90 tablet 3  . HYDROcodone-acetaminophen (NORCO) 5-325 MG tablet Take 1-2 tablets by mouth every 4 (four) hours as needed for  moderate pain. 30 tablet 0  . metoprolol succinate (TOPROL-XL) 100 MG 24 hr tablet Take 1 tablet (100 mg total) by mouth daily. Take with or immediately following a meal. (Patient taking differently: Take 100 mg by mouth every morning. Take with or immediately  following a meal.) 90 tablet 3  . Multiple Vitamins-Minerals (ALIVE WOMENS 50+ PO) Take 1 tablet by mouth daily.    . naproxen sodium (ANAPROX) 220 MG tablet Take 440 mg by mouth daily as needed.    . Omega-3 Fatty Acids (FISH OIL TRIPLE STRENGTH PO) Take 1 capsule by mouth daily.    . rosuvastatin (CRESTOR) 20 MG tablet Take 1 tablet (20 mg total) by mouth daily. (Patient taking differently: Take 20 mg by mouth every morning. ) 90 tablet 3  . vitamin B-12 (CYANOCOBALAMIN) 500 MCG tablet Take 500 mcg by mouth daily.    . vitamin E 400 UNIT capsule Take 400 Units by mouth daily.     No current facility-administered medications for this encounter.     ECOG PERFORMANCE STATUS:  0 - Asymptomatic  REVIEW OF SYSTEMS:  Patient denies any weight loss, fatigue, weakness, fever, chills or night sweats. Patient denies any loss of vision, blurred vision. Patient denies any ringing  of the ears or hearing loss. No irregular heartbeat. Patient denies heart murmur or history of fainting. Patient denies any chest pain or pain radiating to her upper extremities. Patient denies any shortness of breath, difficulty breathing at night, cough or hemoptysis. Patient denies any swelling in the lower legs. Patient denies any nausea vomiting, vomiting of blood, or coffee ground material in the vomitus. Patient denies any stomach pain. Patient states has had normal bowel movements no significant constipation or diarrhea. Patient denies any dysuria, hematuria or significant nocturia. Patient denies any problems walking, swelling in the joints or loss of balance. Patient denies any skin changes, loss of hair or loss of weight. Patient denies any excessive worrying or anxiety or significant depression. Patient denies any problems with insomnia. Patient denies excessive thirst, polyuria, polydipsia. Patient denies any swollen glands, patient denies easy bruising or easy bleeding. Patient denies any recent infections, allergies or  URI. Patient "s visual fields have not changed significantly in recent time.    PHYSICAL EXAM: BP (!) 159/94   Pulse 71   Temp (!) 96.9 F (36.1 C)   Resp 18   Ht 6' (1.829 m)   Wt 234 lb 2.1 oz (106.2 kg)   BMI 31.75 kg/m  Patient status was wide local excision of left breast which is healing well. No dominant mass or nodularity is noted in either breast in 2 positions examined. No axillary or supraclavicular adenopathy is appreciated. Well-developed well-nourished patient in NAD. HEENT reveals PERLA, EOMI, discs not visualized.  Oral cavity is clear. No oral mucosal lesions are identified. Neck is clear without evidence of cervical or supraclavicular adenopathy. Lungs are clear to A&P. Cardiac examination is essentially unremarkable with regular rate and rhythm without murmur rub or thrill. Abdomen is benign with no organomegaly or masses noted. Motor sensory and DTR levels are equal and symmetric in the upper and lower extremities. Cranial nerves II through XII are grossly intact. Proprioception is intact. No peripheral adenopathy or edema is identified. No motor or sensory levels are noted. Crude visual fields are within normal range.  LABORATORY DATA: Pathology reports reviewed    RADIOLOGY RESULTS: Mammograms and ultrasound reviewed   IMPRESSION: Stage I invasive mammary carcinoma in 61 year old female ER/PR positive  status post wide local excision and sentinel node biopsy  PLAN: At this time I discussed the case personally with surgeon. Patient is a cautionary risk for accelerated partial breast radiation based on the extensive ductal carcinoma in situ although it is less than 3 cm making a cautionary. I believe this is a solitary focus of DCIS and I believe she would be a good candidate based on her large breast size and significant side effects including skin reaction which may lead to moist desquamation of the skin in her case. I discussed both whole breast radiation as well as  accelerated partial breast radiation risks and benefits of both procedures were reviewed extensively. Patient does favor based on her work the 5 day course of accelerated partial breast radiation and we will make arrangements for MammoSite catheter placement as well as follow-up studies and treatment. Would plan on delivering 3400 cGy in 10 fractions at 340 C twice a day using single catheter brachytherapy. Patient and her daughter both seem to comprehend my treatment plan well. Patient also will be candidate for antiestrogen therapy after completion of radiation. Should she have MammaPrint performed showing necessity for chemotherapy would still be a candidate after completion of brachytherapy.  I would like to take this opportunity to thank you for allowing me to participate in the care of your patient.Armstead Peaks., MD

## 2016-07-24 ENCOUNTER — Inpatient Hospital Stay: Payer: Self-pay

## 2016-07-24 ENCOUNTER — Ambulatory Visit (INDEPENDENT_AMBULATORY_CARE_PROVIDER_SITE_OTHER): Payer: 59 | Admitting: General Surgery

## 2016-07-24 ENCOUNTER — Encounter: Payer: Self-pay | Admitting: General Surgery

## 2016-07-24 VITALS — BP 142/78 | HR 78 | Resp 14 | Ht 72.0 in | Wt 234.0 lb

## 2016-07-24 DIAGNOSIS — C50212 Malignant neoplasm of upper-inner quadrant of left female breast: Secondary | ICD-10-CM | POA: Diagnosis not present

## 2016-07-24 DIAGNOSIS — Z170 Estrogen receptor positive status [ER+]: Principal | ICD-10-CM

## 2016-07-24 NOTE — Progress Notes (Signed)
Patient ID: Olivia Kirk, female   DOB: [birth year], 61 y.o.   MRN: [id number]  Chief Complaint  Patient presents with  . Procedure    left mammosite    HPI Olivia Kirk is a 61 y.o. female here today for a left mammosite placement. The patient was evaluated by Noreene Filbert, M.D. from radiation oncology and felt to be appropriate candidate for accelerated partial breast radiation. She did take her antibiotic this morning. HPI   Past Medical History:  Diagnosis Date  . Anemia    H/O  . Arthritis    FINGER MIDDLE FINGER RIGHT HAND, LEFT KNEE  . Breast cancer ([place]) 06/06/2016   LEFT: T1b (6 mm), N0, 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  Marland Kitchen GERD (gastroesophageal reflux disease)    RARE  . Hyperlipidemia   . Hypertension     Past Surgical History:  Procedure Laterality Date  . BREAST BIOPSY Left 06/06/2016   left stereo. invasive mammary carcinoma  . BREAST LUMPECTOMY Left 07/03/2016   invasive mammary carcinoma  . BREAST LUMPECTOMY WITH NEEDLE LOCALIZATION Left 07/03/2016   Procedure: BREAST LUMPECTOMY WITH NEEDLE LOCALIZATION;  Surgeon: Robert Bellow, MD;  Location: [facility] ORS;  Service: General;  Laterality: Left;  . BREAST LUMPECTOMY WITH SENTINEL LYMPH NODE BIOPSY Left 07/03/2016   Procedure: BREAST LUMPECTOMY WITH SENTINEL LYMPH NODE BX;  Surgeon: Robert Bellow, MD;  Location: [facility] ORS;  Service: General;  Laterality: Left;  . hemrrhoid    . TUBAL LIGATION      Family History  Problem Relation Age of Onset  . Hypertension Mother   . Cancer Father   . Heart disease Father   . Hypertension Maternal Grandmother   . Stroke Maternal Grandmother   . Breast cancer Paternal Aunt     52    Social History Social History  Substance Use Topics  . Smoking status: Former Smoker    Packs/day: 0.25    Years: 6.00    Types: Cigarettes    Quit date: 05/07/1987  . Smokeless tobacco: Never Used  . Alcohol use 0.0 oz/week      Comment: Occassionally    Allergies  Allergen Reactions  . Guaifenesin & Derivatives Itching, Dermatitis and Rash    Current Outpatient Prescriptions  Medication Sig Dispense Refill  . Ascorbic Acid (VITAMIN C PO) Take 1 tablet by mouth daily.    . cefadroxil (DURICEF) 500 MG capsule Take 1 capsule (500 mg total) by mouth 2 (two) times daily. Start one hour prior to office procedure on 07-24-16 22 capsule 0  . Cholecalciferol (VITAMIN D-3) 5000 units TABS Take 5,000 Units by mouth daily.    . hydrochlorothiazide (HYDRODIURIL) 25 MG tablet Take 1 tablet (25 mg total) by mouth daily. 90 tablet 3  . HYDROcodone-acetaminophen (NORCO) 5-325 MG tablet Take 1-2 tablets by mouth every 4 (four) hours as needed for moderate pain. 30 tablet 0  . metoprolol succinate (TOPROL-XL) 100 MG 24 hr tablet Take 1 tablet (100 mg total) by mouth daily. Take with or immediately following a meal. (Patient taking differently: Take 100 mg by mouth every morning. Take with or immediately following a meal.) 90 tablet 3  . Multiple Vitamins-Minerals (ALIVE WOMENS 50+ PO) Take 1 tablet by mouth daily.    . naproxen sodium (ANAPROX) 220 MG tablet Take 440 mg by mouth daily as needed.    . Omega-3 Fatty Acids (FISH OIL TRIPLE STRENGTH PO) Take 1 capsule by mouth daily.    Marland Kitchen  rosuvastatin (CRESTOR) 20 MG tablet Take 1 tablet (20 mg total) by mouth daily. (Patient taking differently: Take 20 mg by mouth every morning. ) 90 tablet 3  . vitamin B-12 (CYANOCOBALAMIN) 500 MCG tablet Take 500 mcg by mouth daily.    . vitamin E 400 UNIT capsule Take 400 Units by mouth daily.     No current facility-administered medications for this visit.     Review of Systems Review of Systems  Constitutional: Negative.   Respiratory: Negative.   Cardiovascular: Negative.     Blood pressure (!) 142/78, pulse 78, resp. rate 14, height 6' (1.829 m), weight 234 lb (106.1 kg).  Physical Exam Physical Exam  Pulmonary/Chest:      Data  Reviewed Radiation oncology note of 07/19/2016.  Assessment     Candidate for accelerated partial breast radiation.    Plan    Procedure was reviewed and the patient was amenable to proceed. A total of 10 mL of 0.5% Xylocaine with 0.25% Marcaine with 1-200,000 of epinephrine was visualized well tolerated. ChloraPrep was applied to the skin. From a lateral approach an 8 mm incision was made and a trocar was introduced into the seroma cavity. About 10 mL of residual seroma fluid was noted without odor. Straw-colored. This had measured 1.9 x 2.4 x 2.41 cm prior to the procedure. This was down significantly from her July 10, 2016 exam when the cavity measured 2.4 x 4.9 x 5.27 m. Distance to the skin was 2.1 cm. A cavity evaluation device was placed under ultrasound guidance and inflated to 35 cm. She had some transient discomfort. Spherical insufflation was noted. The measurement from skin to balloon was decreased 1.28 cm. No evidence of residual seroma. The treatment balloon was inflated to 60 mm with spherical insufflation noted. This was inflated with him in saline and Omnipaque. The treatment balloon was placed under ultrasound guidance and inflated to 35 cc volume. This was tolerated without discomfort. Good positioning within the wide excision site was noted. The exit site was treated with bacitracin followed by dry gauze dressing.  Post procedure wound care was reviewed and supplies provided.  The patient will present for simulation to the radiation center on Wednesday, 07/26/2016 at 9 AM.      Robert Bellow 07/24/2016, 5:53 PM

## 2016-07-26 ENCOUNTER — Ambulatory Visit
Admission: RE | Admit: 2016-07-26 | Discharge: 2016-07-26 | Disposition: A | Discharge status: Home / Self Care | Payer: 59 | Source: Ambulatory Visit | Attending: Radiation Oncology | Admitting: Radiation Oncology

## 2016-07-26 DIAGNOSIS — C50212 Malignant neoplasm of upper-inner quadrant of left female breast: Secondary | ICD-10-CM | POA: Diagnosis not present

## 2016-07-27 ENCOUNTER — Ambulatory Visit
Admission: RE | Admit: 2016-07-27 | Discharge: 2016-07-27 | Disposition: A | Discharge status: Home / Self Care | Payer: 59 | Source: Ambulatory Visit | Attending: Radiation Oncology | Admitting: Radiation Oncology

## 2016-07-27 ENCOUNTER — Other Ambulatory Visit: Payer: Self-pay | Admitting: Family Medicine

## 2016-07-27 DIAGNOSIS — C50212 Malignant neoplasm of upper-inner quadrant of left female breast: Secondary | ICD-10-CM | POA: Diagnosis not present

## 2016-07-31 ENCOUNTER — Ambulatory Visit
Admission: RE | Admit: 2016-07-31 | Discharge: 2016-07-31 | Disposition: A | Discharge status: Home / Self Care | Payer: 59 | Source: Ambulatory Visit | Attending: Radiation Oncology | Admitting: Radiation Oncology

## 2016-07-31 DIAGNOSIS — C50212 Malignant neoplasm of upper-inner quadrant of left female breast: Secondary | ICD-10-CM | POA: Diagnosis not present

## 2016-08-01 ENCOUNTER — Ambulatory Visit
Admission: RE | Admit: 2016-08-01 | Discharge: 2016-08-01 | Disposition: A | Discharge status: Home / Self Care | Payer: 59 | Source: Ambulatory Visit | Attending: Radiation Oncology | Admitting: Radiation Oncology

## 2016-08-01 DIAGNOSIS — C50212 Malignant neoplasm of upper-inner quadrant of left female breast: Secondary | ICD-10-CM | POA: Diagnosis not present

## 2016-08-01 NOTE — Progress Notes (Deleted)
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  INTERVAL HISTORY: Patient is a 61 year old female who was noted to have an abnormality on routine screening mammogram. Subsequent ultrasound and biopsy revealed the above stated breast cancer. She currently feels well and is asymptomatic. She has no neurologic complaints. She denies any recent fevers or illnesses. She has a good appetite and denies weight loss. She has no chest pain or shortness of breath. She denies any nausea, vomiting, constipation, or diarrhea. She has no urinary complaints. Patient feels at her baseline and offers no specific complaints today.  REVIEW OF SYSTEMS:   Review of Systems  Constitutional: Negative.  Negative for fever, malaise/fatigue and weight loss.  Respiratory: Negative.  Negative for cough and shortness of breath.   Cardiovascular: Negative.  Negative for chest pain and leg swelling.  Gastrointestinal: Negative.  Negative for abdominal pain.  Genitourinary: Negative.   Musculoskeletal: Negative.   Neurological: Negative.  Negative for sensory change and weakness.  Psychiatric/Behavioral: Negative.  The patient is not nervous/anxious.     As per HPI. Otherwise, a complete review of systems is negative.  PAST MEDICAL HISTORY: Past Medical History:  Diagnosis Date  . Anemia    H/O  . Arthritis    FINGER MIDDLE FINGER RIGHT HAND, LEFT KNEE  . Breast cancer ([place]) 06/06/2016   LEFT: T1b (6 mm), N0, 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  Marland Kitchen GERD (gastroesophageal reflux disease)    RARE  . Hyperlipidemia   .  Hypertension     PAST SURGICAL HISTORY: Past Surgical History:  Procedure Laterality Date  . BREAST BIOPSY Left 06/06/2016   left stereo. invasive mammary carcinoma  . BREAST LUMPECTOMY Left 07/03/2016   invasive mammary carcinoma  . BREAST LUMPECTOMY WITH NEEDLE LOCALIZATION Left 07/03/2016   Procedure: BREAST LUMPECTOMY WITH NEEDLE LOCALIZATION;  Surgeon: Robert Bellow, MD;  Location: [facility] ORS;  Service: General;  Laterality: Left;  . BREAST LUMPECTOMY WITH SENTINEL LYMPH NODE BIOPSY Left 07/03/2016   Procedure: BREAST LUMPECTOMY WITH SENTINEL LYMPH NODE BX;  Surgeon: Robert Bellow, MD;  Location: [facility] ORS;  Service: General;  Laterality: Left;  . hemrrhoid    . TUBAL LIGATION      FAMILY HISTORY: Family History  Problem Relation Age of Onset  . Hypertension Mother   . Cancer Father   . Heart disease Father   . Hypertension Maternal Grandmother   . Stroke Maternal Grandmother   . Breast cancer Paternal Aunt        85    ADVANCED DIRECTIVES (Y/N):  N  HEALTH MAINTENANCE: Social History  Substance Use Topics  . Smoking status: Former Smoker    Packs/day: 0.25    Years: 6.00    Types: Cigarettes    Quit date: 05/07/1987  . Smokeless tobacco: Never Used  . Alcohol use 0.0 oz/week     Comment: Occassionally     Colonoscopy:  PAP:  Bone density:  Lipid panel:  Allergies  Allergen Reactions  . Guaifenesin & Derivatives Itching, Dermatitis and Rash    Current Outpatient Prescriptions  Medication Sig Dispense Refill  . Ascorbic Acid (VITAMIN C  PO) Take 1 tablet by mouth daily.    . cefadroxil (DURICEF) 500 MG capsule Take 1 capsule (500 mg total) by mouth 2 (two) times daily. Start one hour prior to office procedure on 07-24-16 22 capsule 0  . Cholecalciferol (VITAMIN D-3) 5000 units TABS Take 5,000 Units by mouth daily.    . hydrochlorothiazide (HYDRODIURIL) 25 MG tablet Take 1 tablet (25 mg total) by mouth daily. 90 tablet 3  . metoprolol succinate  (TOPROL-XL) 100 MG 24 hr tablet Take 1 tablet (100 mg total) by mouth daily. Take with or immediately following a meal. (Patient taking differently: Take 100 mg by mouth every morning. Take with or immediately following a meal.) 90 tablet 3  . Multiple Vitamins-Minerals (ALIVE WOMENS 50+ PO) Take 1 tablet by mouth daily.    . naproxen sodium (ANAPROX) 220 MG tablet Take 440 mg by mouth daily as needed.    . Omega-3 Fatty Acids (FISH OIL TRIPLE STRENGTH PO) Take 1 capsule by mouth daily.    . rosuvastatin (CRESTOR) 20 MG tablet Take 1 tablet (20 mg total) by mouth daily. (Patient taking differently: Take 20 mg by mouth every morning. ) 90 tablet 3  . vitamin B-12 (CYANOCOBALAMIN) 500 MCG tablet Take 500 mcg by mouth daily.    . vitamin E 400 UNIT capsule Take 400 Units by mouth daily.     No current facility-administered medications for this visit.     OBJECTIVE: There were no vitals filed for this visit.   There is no height or weight on file to calculate BMI.    ECOG FS:0 - Asymptomatic  General: Well-developed, well-nourished, no acute distress. Eyes: Pink conjunctiva, anicteric sclera. HEENT: Normocephalic, moist mucous membranes, clear oropharnyx.  Breasts: Patient requested exam be deferred today. Lungs: Clear to auscultation bilaterally. Heart: Regular rate and rhythm. No rubs, murmurs, or gallops. Abdomen: Soft, nontender, nondistended. No organomegaly noted, normoactive bowel sounds. Musculoskeletal: No edema, cyanosis, or clubbing. Neuro: Alert, answering all questions appropriately. Cranial nerves grossly intact. Skin: No rashes or petechiae noted. Psych: Normal affect. Lymphatics: No cervical, calvicular, axillary or inguinal LAD.   LAB RESULTS:  Lab Results  Component Value Date   NA 141 02/02/2016   K 3.6 06/27/2016   CL 102 02/02/2016   CO2 32 02/02/2016   GLUCOSE 86 02/02/2016   BUN 14 02/02/2016   CREATININE 0.87 02/02/2016   CALCIUM 10.4 02/02/2016   PROT 7.8  02/02/2016   ALBUMIN 4.7 02/02/2016   AST 16 02/02/2016   ALT 12 02/02/2016   ALKPHOS 50 02/02/2016   BILITOT 0.4 02/02/2016    Lab Results  Component Value Date   WBC 6.9 02/02/2016   NEUTROABS 3.5 05/20/2012   HGB 12.4 02/02/2016   HCT 38.0 02/02/2016   MCV 81.2 02/02/2016   PLT 263.0 02/02/2016     STUDIES: Nm Sentinel Node Injection  Result Date: 07/03/2016 CLINICAL DATA:  Left breast cancer. EXAM: NUCLEAR MEDICINE BREAST LYMPHOSCINTIGRAPHY LEFT TECHNIQUE: Intradermal injection of radiopharmaceutical was performed at the 12 o'clock, 3 o'clock, 6 o'clock, and 9 o'clock positions around the left nipple. The patient was then sent to the operating room where the sentinel node(s) were identified and removed by the surgeon. RADIOPHARMACEUTICALS:  Total of 1 mCi Millipore-filtered Technetium-7m sulfur colloid, injected in four aliquots of 0.25 mCi each. IMPRESSION: Uncomplicated intradermal injection of a total of 1 mCi Technetium-28m sulfur colloid for purposes of sentinel node identification. Electronically Signed   By: Judie Petit.  Shick M.D.   On:  07/03/2016 09:20   Mm Breast Surgical Specimen  Result Date: 07/03/2016 CLINICAL DATA:  Post left breast lumpectomy for DCIS. EXAM: SPECIMEN RADIOGRAPH OF THE LEFT BREAST COMPARISON:  Previous exam(s). FINDINGS: Status post excision of the left breast. The wire tip and biopsy marker clip are present and are marked for pathology. The malignant calcifications are well away from the margins of the specimen. IMPRESSION: Specimen radiograph of the left breast. Electronically Signed   By: Ted Mcalpine M.D.   On: 07/03/2016 11:13   US Breast Complete Uni Left Inc Axilla  Result Date: 07/24/2016 Procedure was reviewed and the patient was amenable to proceed. A total of 10 mL of 0.5% Xylocaine with 0.25% Marcaine with 1-200,000 of epinephrine was visualized well tolerated. ChloraPrep was applied to the skin. From a lateral approach an 8 mm incision was  made and a trocar was introduced into the seroma cavity. About 10 mL of residual seroma fluid was noted without odor. Straw-colored. This had measured 1.9 x 2.4 x 2.41 cm prior to the procedure. This was down significantly from her July 10, 2016 exam when the cavity measured 2.4 x 4.9 x 5.27 m. Distance to the skin was 2.1 cm. A cavity evaluation device was placed under ultrasound guidance and inflated to 35 cm. She had some transient discomfort. Spherical insufflation was noted. The measurement from skin to balloon was decreased 1.28 cm. No evidence of residual seroma. The treatment balloon was inflated to 60 mm with spherical insufflation noted. This was inflated with him in saline and Omnipaque. The treatment balloon was placed under ultrasound guidance and inflated to 35 cc volume. This was tolerated without discomfort. Good positioning within the wide excision site was noted. The exit site was treated with bacitracin followed by dry gauze dressing.  Post procedure wound care was reviewed and supplies provided.  US Breast Complete Uni Left Inc Axilla  Result Date: 07/24/2016 Ultrasound examination of the left breast was completed to determine if the patient would be a candidate for accelerated partial breast radiation based on anatomy.  There is a seroma cavity  2.4 cm below the skin measuring 2.4 x 4.9 x 5.2 cm.  Mm Lt Plc Breast Loc Dev   1st Lesion  Inc Mammo Guide  Result Date: 07/03/2016 CLINICAL DATA:  Left breast upper inner quadrant DCIS, preoperative needle localization. EXAM: NEEDLE LOCALIZATION OF THE LEFT BREAST WITH MAMMO GUIDANCE COMPARISON:  Previous exams. FINDINGS: Patient presents for needle localization prior to left breast lumpectomy. I met with the patient and we discussed the procedure of needle localization including benefits and alternatives. We discussed the high likelihood of a successful procedure. We discussed the risks of the procedure, including infection, bleeding, tissue  injury, and further surgery. Informed, written consent was given. The usual time-out protocol was performed immediately prior to the procedure. Using mammographic guidance, sterile technique, 1% lidocaine and a 7 cm modified Kopans needle, left breast upper inner quadrant calcifications and top hat tissue marker were localized using superior approach. The images were marked for Dr. Lemar Livings. IMPRESSION: Needle localization left breast. No apparent complications. Electronically Signed   By: Ted Mcalpine M.D.   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  PLAN:   1. 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 If she undergoes lumpectomy, she will require adjuvant XRT. Will order Mammoprint to evaluate whether adjuvant chemotherapy is necessary. Given the ER/PR status of her tumor, she will benefit from an aromatase inhibitor for 5 years at the conclusion of her treatments. Patient will return to clinic 1-2 weeks after her surgery to discuss her final pathology results and additional treatment planning.  Approximately 45 minutes was spent in discussion of which greater than 50% was consultation.  Patient expressed understanding and was in agreement with this plan. She also understands that She can call clinic at any time with any questions, concerns, or complaints.   Cancer Staging Malignant neoplasm of upper-inner quadrant of left breast in female, estrogen receptor positive ([place]) Staging form: Breast, AJCC 8th Edition - Clinical stage from 06/13/2016: Stage IB (cT2, cN0, cM0, G2, ER: Positive, PR: Positive, HER2: Negative) - Signed by Lloyd Huger, MD on 06/13/2016   Lloyd Huger, MD   08/01/2016 11:12  PM

## 2016-08-02 ENCOUNTER — Inpatient Hospital Stay: Payer: 59 | Admitting: Oncology

## 2016-08-02 ENCOUNTER — Ambulatory Visit
Admission: RE | Admit: 2016-08-02 | Discharge: 2016-08-02 | Disposition: A | Discharge status: Home / Self Care | Payer: 59 | Source: Ambulatory Visit | Attending: Radiation Oncology | Admitting: Radiation Oncology

## 2016-08-02 DIAGNOSIS — C50212 Malignant neoplasm of upper-inner quadrant of left female breast: Secondary | ICD-10-CM | POA: Diagnosis not present

## 2016-08-03 ENCOUNTER — Ambulatory Visit
Admission: RE | Admit: 2016-08-03 | Discharge: 2016-08-03 | Disposition: A | Discharge status: Home / Self Care | Payer: 59 | Source: Ambulatory Visit | Attending: Radiation Oncology | Admitting: Radiation Oncology

## 2016-08-03 ENCOUNTER — Inpatient Hospital Stay: Payer: 59 | Attending: Oncology | Admitting: Oncology

## 2016-08-03 ENCOUNTER — Ambulatory Visit (INDEPENDENT_AMBULATORY_CARE_PROVIDER_SITE_OTHER): Payer: 59 | Admitting: General Surgery

## 2016-08-03 ENCOUNTER — Encounter: Payer: Self-pay | Admitting: General Surgery

## 2016-08-03 VITALS — BP 142/88 | HR 70 | Resp 14 | Ht 71.0 in | Wt 230.0 lb

## 2016-08-03 VITALS — BP 199/113 | HR 63 | Temp 94.4°F | Resp 20 | Wt 231.0 lb

## 2016-08-03 DIAGNOSIS — Z79899 Other long term (current) drug therapy: Secondary | ICD-10-CM | POA: Insufficient documentation

## 2016-08-03 DIAGNOSIS — Z170 Estrogen receptor positive status [ER+]: Secondary | ICD-10-CM | POA: Diagnosis not present

## 2016-08-03 DIAGNOSIS — E785 Hyperlipidemia, unspecified: Secondary | ICD-10-CM | POA: Insufficient documentation

## 2016-08-03 DIAGNOSIS — C50212 Malignant neoplasm of upper-inner quadrant of left female breast: Secondary | ICD-10-CM

## 2016-08-03 DIAGNOSIS — Z803 Family history of malignant neoplasm of breast: Secondary | ICD-10-CM | POA: Diagnosis not present

## 2016-08-03 DIAGNOSIS — Z87891 Personal history of nicotine dependence: Secondary | ICD-10-CM | POA: Diagnosis not present

## 2016-08-03 DIAGNOSIS — K219 Gastro-esophageal reflux disease without esophagitis: Secondary | ICD-10-CM | POA: Insufficient documentation

## 2016-08-03 DIAGNOSIS — I10 Essential (primary) hypertension: Secondary | ICD-10-CM | POA: Insufficient documentation

## 2016-08-03 MED ORDER — LETROZOLE 2.5 MG PO TABS: 2.5000 mg | ORAL_TABLET | Freq: Every day | ORAL | 11 refills | Status: DC

## 2016-08-03 NOTE — Progress Notes (Signed)
Patient ID: Olivia Kirk, female   DOB: 07/04/1955, 61 y.o.   MRN: [id number]  Chief Complaint  Patient presents with  . Follow-up    mammosite placement    HPI Olivia Kirk is a 61 y.o. female here for a follow up from a left Mammosite placement done on 07/26/16. Today was her last dose of radiation. Mammosite was removed afterwards.     HPI  Past Medical History:  Diagnosis Date  . Anemia    H/O  . Arthritis    FINGER MIDDLE FINGER RIGHT HAND, LEFT KNEE  . Breast cancer ([place]) 06/06/2016   LEFT: T1b (6 mm), N0, 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  Marland Kitchen GERD (gastroesophageal reflux disease)    RARE  . Hyperlipidemia   . Hypertension     Past Surgical History:  Procedure Laterality Date  . BREAST BIOPSY Left 06/06/2016   left stereo. invasive mammary carcinoma  . BREAST LUMPECTOMY Left 07/03/2016   invasive mammary carcinoma  . BREAST LUMPECTOMY WITH NEEDLE LOCALIZATION Left 07/03/2016   Procedure: BREAST LUMPECTOMY WITH NEEDLE LOCALIZATION;  Surgeon: Robert Bellow, MD;  Location: [facility] ORS;  Service: General;  Laterality: Left;  . BREAST LUMPECTOMY WITH SENTINEL LYMPH NODE BIOPSY Left 07/03/2016   Procedure: BREAST LUMPECTOMY WITH SENTINEL LYMPH NODE BX;  Surgeon: Robert Bellow, MD;  Location: [facility] ORS;  Service: General;  Laterality: Left;  . hemrrhoid    . TUBAL LIGATION      Family History  Problem Relation Age of Onset  . Hypertension Mother   . Cancer Father   . Heart disease Father   . Hypertension Maternal Grandmother   . Stroke Maternal Grandmother   . Breast cancer Paternal Aunt        49    Social History Social History  Substance Use Topics  . Smoking status: Former Smoker    Packs/day: 0.25    Years: 6.00    Types: Cigarettes    Quit date: 05/07/1987  . Smokeless tobacco: Never Used  . Alcohol use 0.0 oz/week     Comment: Occassionally    Allergies  Allergen Reactions  . Guaifenesin & Derivatives  Itching, Dermatitis and Rash    Current Outpatient Prescriptions  Medication Sig Dispense Refill  . Ascorbic Acid (VITAMIN C PO) Take 1 tablet by mouth daily.    . cefadroxil (DURICEF) 500 MG capsule Take 1 capsule (500 mg total) by mouth 2 (two) times daily. Start one hour prior to office procedure on 07-24-16 22 capsule 0  . Cholecalciferol (VITAMIN D-3) 5000 units TABS Take 5,000 Units by mouth daily.    . hydrochlorothiazide (HYDRODIURIL) 25 MG tablet Take 1 tablet (25 mg total) by mouth daily. 90 tablet 3  . letrozole (FEMARA) 2.5 MG tablet Take 1 tablet (2.5 mg total) by mouth daily. 30 tablet 11  . metoprolol succinate (TOPROL-XL) 100 MG 24 hr tablet Take 1 tablet (100 mg total) by mouth daily. Take with or immediately following a meal. (Patient taking differently: Take 100 mg by mouth every morning. Take with or immediately following a meal.) 90 tablet 3  . Multiple Vitamins-Minerals (ALIVE WOMENS 50+ PO) Take 1 tablet by mouth daily.    . naproxen sodium (ANAPROX) 220 MG tablet Take 440 mg by mouth daily as needed.    . Omega-3 Fatty Acids (FISH OIL TRIPLE STRENGTH PO) Take 1 capsule by mouth daily.    . rosuvastatin (CRESTOR) 20 MG tablet Take 1  tablet (20 mg total) by mouth daily. (Patient taking differently: Take 20 mg by mouth every morning. ) 90 tablet 3  . vitamin B-12 (CYANOCOBALAMIN) 500 MCG tablet Take 500 mcg by mouth daily.    . vitamin E 400 UNIT capsule Take 400 Units by mouth daily.     No current facility-administered medications for this visit.     Review of Systems Review of Systems  Constitutional: Positive for fatigue. Negative for activity change, appetite change, chills, diaphoresis, fever and unexpected weight change.  Respiratory: Negative.   Cardiovascular: Negative.     Blood pressure (!) 142/88, pulse 70, resp. rate 14, height '5\' 11"'$  (1.803 m), weight 230 lb (104.3 kg).  Physical Exam Physical Exam  Pulmonary/Chest:      Data Reviewed Dr.  Gary Fleet note from earlier today was reviewed. In his clinical note she is staged as IB and he describes a T2 lesion. Pathology showed a 6 mm invasive carcinoma which would be T1b, N0 based on her sentinel node status. ER/PR positive, HER-2/neu negative. In his original preoperative assessment of discussion regarding Mammoprint testing was made, unclear whether this is been followed through based on final pathology.  Assessment    Doing well status post accelerated partial breast radiation.  Candidate for antiestrogen treatment. (Prescription for letrozole provided by Dr. Grayland Ormond)    Plan    We will plan for a follow-up examination in 2 months.    HPI, Physical Exam, Assessment and Plan have been scribed under the direction and in the presence of Robert Bellow, MD  Concepcion Living, LPN  I have completed the exam and reviewed the above documentation for accuracy and completeness.  I agree with the above.  Haematologist has been used and any errors in dictation or transcription are unintentional.  Hervey Ard, M.D., F.A.C.S.   Robert Bellow 08/04/2016, 6:41 AM

## 2016-08-03 NOTE — Progress Notes (Signed)
Patient denies any concerns today.  

## 2016-08-03 NOTE — Progress Notes (Signed)
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  INTERVAL HISTORY: Patient returns to clinic today for further evaluation and treatment planning. She is actively receiving MammoSite radiation. She currently feels well and is asymptomatic. She has no neurologic complaints. She denies any recent fevers or illnesses. She has a good appetite and denies weight loss. She has no chest pain or shortness of breath. She denies any nausea, vomiting, constipation, or diarrhea. She has no urinary complaints. Patient feels at her baseline and offers no specific complaints today.  REVIEW OF SYSTEMS:   Review of Systems  Constitutional: Negative.  Negative for fever, malaise/fatigue and weight loss.  Respiratory: Negative.  Negative for cough and shortness of breath.   Cardiovascular: Negative.  Negative for chest pain and leg swelling.  Gastrointestinal: Negative.  Negative for abdominal pain.  Genitourinary: Negative.   Musculoskeletal: Negative.   Skin: Negative.  Negative for rash.  Neurological: Negative.  Negative for sensory change and weakness.  Psychiatric/Behavioral: Negative.  The patient is not nervous/anxious.     As per HPI. Otherwise, a complete review of systems is negative.  PAST MEDICAL HISTORY: Past Medical History:  Diagnosis Date  . Anemia    H/O  . Arthritis    FINGER MIDDLE FINGER RIGHT HAND, LEFT KNEE  . Breast cancer ([place]) 06/06/2016   LEFT: T1b (6 mm), N0, 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  Marland Kitchen GERD (gastroesophageal reflux disease)    RARE  . Hyperlipidemia   .  Hypertension     PAST SURGICAL HISTORY: Past Surgical History:  Procedure Laterality Date  . BREAST BIOPSY Left 06/06/2016   left stereo. invasive mammary carcinoma  . BREAST LUMPECTOMY Left 07/03/2016   invasive mammary carcinoma  . BREAST LUMPECTOMY WITH NEEDLE LOCALIZATION Left 07/03/2016   Procedure: BREAST LUMPECTOMY WITH NEEDLE LOCALIZATION;  Surgeon: Robert Bellow, MD;  Location: [facility] ORS;  Service: General;  Laterality: Left;  . BREAST LUMPECTOMY WITH SENTINEL LYMPH NODE BIOPSY Left 07/03/2016   Procedure: BREAST LUMPECTOMY WITH SENTINEL LYMPH NODE BX;  Surgeon: Robert Bellow, MD;  Location: [facility] ORS;  Service: General;  Laterality: Left;  . hemrrhoid    . TUBAL LIGATION      FAMILY HISTORY: Family History  Problem Relation Age of Onset  . Hypertension Mother   . Cancer Father   . Heart disease Father   . Hypertension Maternal Grandmother   . Stroke Maternal Grandmother   . Breast cancer Paternal Aunt        76    ADVANCED DIRECTIVES (Y/N):  N  HEALTH MAINTENANCE: Social History  Substance Use Topics  . Smoking status: Former Smoker    Packs/day: 0.25    Years: 6.00    Types: Cigarettes    Quit date: 05/07/1987  . Smokeless tobacco: Never Used  . Alcohol use 0.0 oz/week     Comment: Occassionally     Colonoscopy:  PAP:  Bone density:  Lipid panel:  Allergies  Allergen Reactions  . Guaifenesin & Derivatives Itching, Dermatitis and Rash    Current Outpatient Prescriptions  Medication Sig Dispense Refill  . Ascorbic Acid (VITAMIN C PO) Take  1 tablet by mouth daily.    . cefadroxil (DURICEF) 500 MG capsule Take 1 capsule (500 mg total) by mouth 2 (two) times daily. Start one hour prior to office procedure on 07-24-16 22 capsule 0  . Cholecalciferol (VITAMIN D-3) 5000 units TABS Take 5,000 Units by mouth daily.    . hydrochlorothiazide (HYDRODIURIL) 25 MG tablet Take 1 tablet (25 mg total) by mouth daily. 90 tablet 3  . metoprolol succinate  (TOPROL-XL) 100 MG 24 hr tablet Take 1 tablet (100 mg total) by mouth daily. Take with or immediately following a meal. (Patient taking differently: Take 100 mg by mouth every morning. Take with or immediately following a meal.) 90 tablet 3  . Multiple Vitamins-Minerals (ALIVE WOMENS 50+ PO) Take 1 tablet by mouth daily.    . naproxen sodium (ANAPROX) 220 MG tablet Take 440 mg by mouth daily as needed.    . Omega-3 Fatty Acids (FISH OIL TRIPLE STRENGTH PO) Take 1 capsule by mouth daily.    . rosuvastatin (CRESTOR) 20 MG tablet Take 1 tablet (20 mg total) by mouth daily. (Patient taking differently: Take 20 mg by mouth every morning. ) 90 tablet 3  . vitamin B-12 (CYANOCOBALAMIN) 500 MCG tablet Take 500 mcg by mouth daily.    . vitamin E 400 UNIT capsule Take 400 Units by mouth daily.    Marland Kitchen letrozole (FEMARA) 2.5 MG tablet Take 1 tablet (2.5 mg total) by mouth daily. 30 tablet 11   No current facility-administered medications for this visit.     OBJECTIVE: Vitals:   08/03/16 1411  BP: (!) 199/113  Pulse: 63  Resp: 20  Temp: (!) 94.4 F (34.7 C)     Body mass index is 31.33 kg/m.    ECOG FS:0 - Asymptomatic  General: Well-developed, well-nourished, no acute distress. Eyes: Pink conjunctiva, anicteric sclera. Breasts: Exam deferred today. Lungs: Clear to auscultation bilaterally. Heart: Regular rate and rhythm. No rubs, murmurs, or gallops. Abdomen: Soft, nontender, nondistended. No organomegaly noted, normoactive bowel sounds. Musculoskeletal: No edema, cyanosis, or clubbing. Neuro: Alert, answering all questions appropriately. Cranial nerves grossly intact. Skin: No rashes or petechiae noted. Psych: Normal affect.   LAB RESULTS:  Lab Results  Component Value Date   NA 141 02/02/2016   K 3.6 06/27/2016   CL 102 02/02/2016   CO2 32 02/02/2016   GLUCOSE 86 02/02/2016   BUN 14 02/02/2016   CREATININE 0.87 02/02/2016   CALCIUM 10.4 02/02/2016   PROT 7.8 02/02/2016   ALBUMIN  4.7 02/02/2016   AST 16 02/02/2016   ALT 12 02/02/2016   ALKPHOS 50 02/02/2016   BILITOT 0.4 02/02/2016    Lab Results  Component Value Date   WBC 6.9 02/02/2016   NEUTROABS 3.5 05/20/2012   HGB 12.4 02/02/2016   HCT 38.0 02/02/2016   MCV 81.2 02/02/2016   PLT 263.0 02/02/2016     STUDIES: US Breast Complete Uni Left Inc Axilla  Result Date: 07/24/2016 Procedure was reviewed and the patient was amenable to proceed. A total of 10 mL of 0.5% Xylocaine with 0.25% Marcaine with 1-200,000 of epinephrine was visualized well tolerated. ChloraPrep was applied to the skin. From a lateral approach an 8 mm incision was made and a trocar was introduced into the seroma cavity. About 10 mL of residual seroma fluid was noted without odor. Straw-colored. This had measured 1.9 x 2.4 x 2.41 cm prior to the procedure. This was down significantly from her July 10, 2016 exam when the cavity  measured 2.4 x 4.9 x 5.27 m. Distance to the skin was 2.1 cm. A cavity evaluation device was placed under ultrasound guidance and inflated to 35 cm. She had some transient discomfort. Spherical insufflation was noted. The measurement from skin to balloon was decreased 1.28 cm. No evidence of residual seroma. The treatment balloon was inflated to 60 mm with spherical insufflation noted. This was inflated with him in saline and Omnipaque. The treatment balloon was placed under ultrasound guidance and inflated to 35 cc volume. This was tolerated without discomfort. Good positioning within the wide excision site was noted. The exit site was treated with bacitracin followed by dry gauze dressing.  Post procedure wound care was reviewed and supplies provided.  US Breast Complete Uni Left Inc Axilla  Result Date: 07/24/2016 Ultrasound examination of the left breast was completed to determine if the patient would be a candidate for accelerated partial breast radiation based on anatomy.  There is a seroma cavity  2.4 cm below the  skin measuring 2.4 x 4.9 x 5.2 cm.   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  PLAN:   1. 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 Patient is currently receiving MammoSite radiation. MammaPrint has been ordered and is pending at time of dictation. Given the small size and low grade of patient's tumor, this is likely going to be negative therefore patient was given a prescription for letrozole which she will take for 5 years completing in May 2023. If MammaPrint returns positive, she will return to clinic in 1-2 weeks to discuss the results. If negative, patient will return to clinic in 3 months. Will get a baseline bone mineral density in the next 1-2 weeks.  Approximately 30 minutes was spent in discussion of which greater than 50% was consultation.  Patient expressed understanding and was in agreement with this plan. She also understands that She can call clinic at any time with any questions, concerns, or complaints.   Cancer Staging Malignant neoplasm of upper-inner quadrant of left breast in female, estrogen receptor positive ([place]) Staging form: Breast, AJCC 8th Edition - Clinical stage from 06/13/2016: Stage IB (cT2, cN0, cM0, G2, ER: Positive, PR: Positive, HER2: Negative) - Signed by Lloyd Huger, MD on 06/13/2016 - Pathologic stage from 08/04/2016: Stage IA (pT1b, pN0, cM0, G1, ER: Positive, PR: Positive, HER2: Negative) - Signed by Lloyd Huger, MD on 08/04/2016   Lloyd Huger, MD   08/04/2016 10:16 AM

## 2016-08-07 ENCOUNTER — Ambulatory Visit: Payer: Self-pay | Admitting: General Surgery

## 2016-08-07 ENCOUNTER — Encounter: Payer: Self-pay | Admitting: Oncology

## 2016-08-08 ENCOUNTER — Ambulatory Visit: Payer: 59

## 2016-08-09 ENCOUNTER — Ambulatory Visit: Payer: 59

## 2016-08-09 ENCOUNTER — Ambulatory Visit
Admission: RE | Admit: 2016-08-09 | Discharge: 2016-08-09 | Disposition: A | Discharge status: Home / Self Care | Payer: 59 | Source: Ambulatory Visit | Attending: Oncology | Admitting: Oncology

## 2016-08-09 DIAGNOSIS — Z170 Estrogen receptor positive status [ER+]: Secondary | ICD-10-CM | POA: Insufficient documentation

## 2016-08-09 DIAGNOSIS — C50212 Malignant neoplasm of upper-inner quadrant of left female breast: Secondary | ICD-10-CM | POA: Insufficient documentation

## 2016-08-09 DIAGNOSIS — M85852 Other specified disorders of bone density and structure, left thigh: Secondary | ICD-10-CM | POA: Diagnosis not present

## 2016-08-10 ENCOUNTER — Ambulatory Visit: Payer: 59

## 2016-08-12 ENCOUNTER — Encounter: Payer: Self-pay | Admitting: Oncology

## 2016-08-15 ENCOUNTER — Ambulatory Visit: Payer: 59

## 2016-08-16 ENCOUNTER — Ambulatory Visit: Payer: 59

## 2016-08-17 ENCOUNTER — Ambulatory Visit: Payer: 59

## 2016-08-21 ENCOUNTER — Encounter: Payer: Self-pay | Admitting: Oncology

## 2016-09-14 ENCOUNTER — Ambulatory Visit (INDEPENDENT_AMBULATORY_CARE_PROVIDER_SITE_OTHER): Payer: 59 | Admitting: Family Medicine

## 2016-09-14 ENCOUNTER — Encounter: Payer: Self-pay | Admitting: Family Medicine

## 2016-09-14 DIAGNOSIS — I10 Essential (primary) hypertension: Secondary | ICD-10-CM

## 2016-09-14 MED ORDER — METOPROLOL SUCCINATE ER 100 MG PO TB24: 100.0000 mg | ORAL_TABLET | Freq: Every day | ORAL | 3 refills | Status: DC

## 2016-09-14 MED ORDER — ROSUVASTATIN CALCIUM 20 MG PO TABS: 20.0000 mg | ORAL_TABLET | Freq: Every day | ORAL | 3 refills | Status: DC

## 2016-09-14 MED ORDER — HYDROCHLOROTHIAZIDE 25 MG PO TABS: 25.0000 mg | ORAL_TABLET | Freq: Every day | ORAL | 3 refills | Status: DC

## 2016-09-14 MED ORDER — AMLODIPINE BESYLATE 5 MG PO TABS: 5.0000 mg | ORAL_TABLET | Freq: Every day | ORAL | 3 refills | Status: DC

## 2016-09-14 NOTE — Assessment & Plan Note (Signed)
Established problem, worsening. Adding Norvasc today. Continue metoprolol and HCTZ.

## 2016-09-14 NOTE — Progress Notes (Signed)
Subjective:  Patient ID: Olivia Kirk, female    DOB: 26-Apr-1955  Age: 61 y.o. MRN: [id number]  CC: Follow up HTN  HPI:  61 year old female presents for follow-up regarding her hypertension.  HTN  BP worsening.  Markedly elevated today.  She has not taken her medications today.  She is currently taking metoprolol and HCTZ.  She reports that she is under great deal of stress. She has recently been diagnosed and treated for breast cancer. Additionally, she just lost her best friend. She attributes some of this to her rise in blood pressure.  No reports of chest pain or shortness of breath. She endorses compliance with her medications excluding the fact that she has not taken them today.  She has no other complaints or associated symptoms today.  Social Hx   Social History   Social History  . Marital status: Married    Spouse name: [name]  . Number of children: 3  . Years of education: 12   Occupational History  . 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 Smoking status: Former Smoker    Packs/day: 0.25    Years: 6.00    Types: Cigarettes    Quit date: 05/07/1987  . Smokeless tobacco: Never Used  . Alcohol use 0.0 oz/week     Comment: Occassionally  . Drug use: No  . Sexual activity: Not Asked   Other Topics Concern  . None   Social History Narrative      Patient lives at home with her husband of 13 years. They have 3 adult children. Recently, one of their daughters moved in with them with her 3 small children. She has a remote tobacco hx. She quite approximately 25 years ago. She has two jobs - she works for [employer] in [employer] and also for [employer].    Review of Systems  Cardiovascular:       Elevated BP.  Psychiatric/Behavioral: The patient is nervous/anxious.    Objective:  BP (!) 164/94 (BP Location: Left Arm, Patient Position: Sitting,  Cuff Size: Large)   Pulse 69   Temp 98.3 F (36.8 C) (Oral)   Wt 230 lb 4 oz (104.4 kg)   SpO2 98%   BMI 32.11 kg/m   BP/Weight 09/14/2016 08/03/2016 9/47/6546  Systolic BP 503 546 568  Diastolic BP 94 127 88  Wt. (Lbs) 230.25 231 230  BMI 32.11 31.33 32.08    Physical Exam  Constitutional: She is oriented to person, place, and time. She appears well-developed. No distress.  HENT:  Head: Normocephalic and atraumatic.  Eyes: Conjunctivae are normal. No scleral icterus.  Cardiovascular: Normal rate and regular rhythm.   Pulmonary/Chest: Effort normal and breath sounds normal. She has no wheezes. She has no rales.  Abdominal: Soft. She exhibits no distension.  Neurological: She is alert and oriented to person, place, and time.  Vitals reviewed.   Lab Results  Component Value Date   WBC 6.9 02/02/2016   HGB 12.4 02/02/2016   HCT 38.0 02/02/2016   PLT 263.0 02/02/2016   GLUCOSE 86 02/02/2016   CHOL 188 02/02/2016   TRIG 163.0 (H) 02/02/2016   HDL 50.40 02/02/2016   LDLDIRECT 106.2 05/20/2012   LDLCALC 105 (H) 02/02/2016   ALT 12 02/02/2016   AST 16 02/02/2016   NA 141 02/02/2016   K 3.6 06/27/2016   CL 102  02/02/2016   CREATININE 0.87 02/02/2016   BUN 14 02/02/2016   CO2 32 02/02/2016   TSH 0.74 05/20/2012   HGBA1C 6.4 02/02/2016    Assessment & Plan:   Problem List Items Addressed This Visit      Cardiovascular and Mediastinum   Essential hypertension    Established problem, worsening. Adding Norvasc today. Continue metoprolol and HCTZ.       Relevant Medications   metoprolol succinate (TOPROL-XL) 100 MG 24 hr tablet   hydrochlorothiazide (HYDRODIURIL) 25 MG tablet   rosuvastatin (CRESTOR) 20 MG tablet   amLODipine (NORVASC) 5 MG tablet      Meds ordered this encounter  Medications  . metoprolol succinate (TOPROL-XL) 100 MG 24 hr tablet    Sig: Take 1 tablet (100 mg total) by mouth daily. Take with or immediately following a meal.    Dispense:  90  tablet    Refill:  3  . hydrochlorothiazide (HYDRODIURIL) 25 MG tablet    Sig: Take 1 tablet (25 mg total) by mouth daily.    Dispense:  90 tablet    Refill:  3  . rosuvastatin (CRESTOR) 20 MG tablet    Sig: Take 1 tablet (20 mg total) by mouth daily.    Dispense:  90 tablet    Refill:  3  . amLODipine (NORVASC) 5 MG tablet    Sig: Take 1 tablet (5 mg total) by mouth daily.    Dispense:  90 tablet    Refill:  3    Follow-up: Return in about 1 month (around 10/14/2016).  [place]

## 2016-09-14 NOTE — Patient Instructions (Signed)
Take the medications as prescribed.  Check BP at home.  Follow up in 1 month.  Take care  Dr. Lacinda Axon

## 2016-09-22 ENCOUNTER — Ambulatory Visit: Payer: 59 | Admitting: Radiation Oncology

## 2016-10-12 ENCOUNTER — Encounter: Payer: Self-pay | Admitting: Radiation Oncology

## 2016-10-12 ENCOUNTER — Ambulatory Visit: Payer: 59 | Admitting: General Surgery

## 2016-10-12 ENCOUNTER — Ambulatory Visit
Admission: RE | Admit: 2016-10-12 | Discharge: 2016-10-12 | Disposition: A | Discharge status: Home / Self Care | Payer: 59 | Source: Ambulatory Visit | Attending: Radiation Oncology | Admitting: Radiation Oncology

## 2016-10-12 VITALS — BP 133/77 | HR 73 | Temp 98.0°F | Wt 233.8 lb

## 2016-10-12 DIAGNOSIS — Z923 Personal history of irradiation: Secondary | ICD-10-CM | POA: Diagnosis not present

## 2016-10-12 DIAGNOSIS — C50211 Malignant neoplasm of upper-inner quadrant of right female breast: Secondary | ICD-10-CM | POA: Diagnosis present

## 2016-10-12 DIAGNOSIS — Z170 Estrogen receptor positive status [ER+]: Secondary | ICD-10-CM | POA: Diagnosis not present

## 2016-10-12 DIAGNOSIS — C50212 Malignant neoplasm of upper-inner quadrant of left female breast: Secondary | ICD-10-CM

## 2016-10-12 NOTE — Progress Notes (Signed)
Radiation Oncology Follow up Note  Name: Olivia Kirk   Date:   10/12/2016 MRN:  [id number] DOB: 1955/07/30    This 61 y.o. female presents to the clinic today for one-month follow-up status post accelerated partial breast irradiation to her left breast for stage I ER/PR positive invasive mammary carcinoma.  REFERRING PROVIDER: Coral Spikes, DO  HPI: Patient is a 61 year old female now seen out 1 month having completed accelerated partial breast irradiation to her left breast status post wide local excision and sentinel node biopsy for a T1 ER/PR positive invasive mammary carcinoma seen today in routine follow-up she is doing well. She specifically denies breast tenderness cough or bone pain.. She is currently on Femara tolerate that well without side effect.  COMPLICATIONS OF TREATMENT: none  FOLLOW UP COMPLIANCE: keeps appointments   PHYSICAL EXAM:  BP 133/77   Pulse 73   Temp 98 F (36.7 C)   Wt 233 lb 12.8 oz (106.1 kg)   BMI 32.61 kg/m  Lungs are clear to A&P cardiac examination essentially unremarkable with regular rate and rhythm. No dominant mass or nodularity is noted in either breast in 2 positions examined. Incision is well-healed. No axillary or supraclavicular adenopathy is appreciated. Cosmetic result is excellent. Well-developed well-nourished patient in NAD. HEENT reveals PERLA, EOMI, discs not visualized.  Oral cavity is clear. No oral mucosal lesions are identified. Neck is clear without evidence of cervical or supraclavicular adenopathy. Lungs are clear to A&P. Cardiac examination is essentially unremarkable with regular rate and rhythm without murmur rub or thrill. Abdomen is benign with no organomegaly or masses noted. Motor sensory and DTR levels are equal and symmetric in the upper and lower extremities. Cranial nerves II through XII are grossly intact. Proprioception is intact. No peripheral adenopathy or edema is identified. No motor or sensory levels are  noted. Crude visual fields are within normal range.  RADIOLOGY RESULTS: No current films for review  PLAN: Present time patient is doing well. She is currently on Femara tolerate what that well without side effect. I'm please were overall progress. I've asked to see her back in 4-5 months for follow-up. Patient knows to call sooner with any concerns.  I would like to take this opportunity to thank you for allowing me to participate in the care of your patient.Armstead Peaks., MD

## 2016-10-17 ENCOUNTER — Ambulatory Visit: Payer: 59 | Admitting: Family Medicine

## 2016-10-23 ENCOUNTER — Encounter: Payer: Self-pay | Admitting: Family Medicine

## 2016-10-23 ENCOUNTER — Ambulatory Visit (INDEPENDENT_AMBULATORY_CARE_PROVIDER_SITE_OTHER): Payer: 59 | Admitting: Family Medicine

## 2016-10-23 DIAGNOSIS — I10 Essential (primary) hypertension: Secondary | ICD-10-CM

## 2016-10-23 MED ORDER — AMLODIPINE BESYLATE 10 MG PO TABS: 5.0000 mg | ORAL_TABLET | Freq: Every day | ORAL | 1 refills | Status: DC

## 2016-10-23 NOTE — Patient Instructions (Signed)
I have increased your Norvasc. Continue metoprolol and HCTZ.  Follow up in 3 months.  Take care  Dr. Lacinda Axon

## 2016-10-23 NOTE — Progress Notes (Signed)
Subjective:  Patient ID: Olivia Kirk, female    DOB: 1955/04/08  Age: 61 y.o. MRN: [id number]  CC: Follow up HTN  HPI:  61 year old female with hypertension, hyperlipidemia, history of breast cancer, obesity, prediabetes presents for follow-up regarding hypertension.  Hypertension  BP is improved but not at goal.  BP 134/94 today.  She endorses compliance with her medications. No adverse side effects. She states she is trying to exercise and monitor her diet.  Social Hx   Social History   Social History  . Marital status: Married    Spouse name: [name]  . Number of children: 3  . Years of education: 12   Occupational History  . 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 Smoking status: Former Smoker    Packs/day: 0.25    Years: 6.00    Types: Cigarettes    Quit date: 05/07/1987  . Smokeless tobacco: Never Used  . Alcohol use 0.0 oz/week     Comment: Occassionally  . Drug use: No  . Sexual activity: Not Asked   Other Topics Concern  . None   Social History Narrative      Patient lives at home with her husband of 16 years. They have 3 adult children. Recently, one of their daughters moved in with them with her 3 small children. She has a remote tobacco hx. She quite approximately 25 years ago. She has two jobs - she works for [employer] in [employer] and also for [employer].    Review of Systems  Constitutional: Negative.   Respiratory: Negative.   Cardiovascular: Negative.    Objective:  BP (!) 134/94 (BP Location: Left Arm, Patient Position: Sitting, Cuff Size: Normal)   Pulse 65   Temp 98.2 F (36.8 C) (Oral)   Resp 16   Wt 229 lb 8 oz (104.1 kg)   SpO2 99%   BMI 32.01 kg/m   BP/Weight 10/23/2016 10/12/2016 09/23/2374  Systolic BP 283 151 761  Diastolic BP 94 77 94  Wt. (Lbs) 229.5 233.8 230.25  BMI 32.01 32.61 32.11    Physical Exam    Constitutional: She is oriented to person, place, and time. She appears well-developed. No distress.  HENT:  Head: Normocephalic and atraumatic.  Eyes: Conjunctivae are normal. No scleral icterus.  Cardiovascular: Normal rate and regular rhythm.   No murmur heard. Pulmonary/Chest: Effort normal. She has no wheezes. She has no rales.  Neurological: She is alert and oriented to person, place, and time.  Psychiatric: She has a normal mood and affect.  Vitals reviewed.   Lab Results  Component Value Date   WBC 6.9 02/02/2016   HGB 12.4 02/02/2016   HCT 38.0 02/02/2016   PLT 263.0 02/02/2016   GLUCOSE 86 02/02/2016   CHOL 188 02/02/2016   TRIG 163.0 (H) 02/02/2016   HDL 50.40 02/02/2016   LDLDIRECT 106.2 05/20/2012   LDLCALC 105 (H) 02/02/2016   ALT 12 02/02/2016   AST 16 02/02/2016   NA 141 02/02/2016   K 3.6 06/27/2016   CL 102 02/02/2016   CREATININE 0.87 02/02/2016   BUN 14 02/02/2016   CO2 32 02/02/2016   TSH 0.74 05/20/2012   HGBA1C 6.4 02/02/2016    Assessment & Plan:   Problem List Items Addressed This Visit    Essential hypertension    Improving but not ago. Increasing amlodipine. Continue  HCTZ and metoprolol.      Relevant Medications   amLODipine (NORVASC) 10 MG tablet      Meds ordered this encounter  Medications  . amLODipine (NORVASC) 10 MG tablet    Sig: Take 0.5 tablets (5 mg total) by mouth daily.    Dispense:  90 tablet    Refill:  1     Follow-up: Return in about 3 months (around 01/23/2017).  [place]

## 2016-10-23 NOTE — Assessment & Plan Note (Signed)
Improving but not ago. Increasing amlodipine. Continue HCTZ and metoprolol.

## 2016-11-07 NOTE — Progress Notes (Deleted)
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  INTERVAL HISTORY: Patient returns to clinic today for further evaluation and treatment planning. She is actively receiving MammoSite radiation. She currently feels well and is asymptomatic. She has no neurologic complaints. She denies any recent fevers or illnesses. She has a good appetite and denies weight loss. She has no chest pain or shortness of breath. She denies any nausea, vomiting, constipation, or diarrhea. She has no urinary complaints. Patient feels at her baseline and offers no specific complaints today.  REVIEW OF SYSTEMS:   Review of Systems  Constitutional: Negative.  Negative for fever, malaise/fatigue and weight loss.  Respiratory: Negative.  Negative for cough and shortness of breath.   Cardiovascular: Negative.  Negative for chest pain and leg swelling.  Gastrointestinal: Negative.  Negative for abdominal pain.  Genitourinary: Negative.   Musculoskeletal: Negative.   Skin: Negative.  Negative for rash.  Neurological: Negative.  Negative for sensory change and weakness.  Psychiatric/Behavioral: Negative.  The patient is not nervous/anxious.     As per HPI. Otherwise, a complete review of systems is negative.  PAST MEDICAL HISTORY: Past Medical History:  Diagnosis Date  . Anemia    H/O  . Arthritis    FINGER MIDDLE FINGER RIGHT HAND, LEFT KNEE  . Breast cancer (HCC) 06/06/2016   LEFT: T1b (6 mm), N0, 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  Marland Kitchen GERD (gastroesophageal reflux disease)    RARE  . Hyperlipidemia   .  Hypertension     PAST SURGICAL HISTORY: Past Surgical History:  Procedure Laterality Date  . BREAST BIOPSY Left 06/06/2016   left stereo. invasive mammary carcinoma  . BREAST LUMPECTOMY Left 07/03/2016   invasive mammary carcinoma  . BREAST LUMPECTOMY WITH NEEDLE LOCALIZATION Left 07/03/2016   Procedure: BREAST LUMPECTOMY WITH NEEDLE LOCALIZATION;  Surgeon: Earline Mayotte, MD;  Location: [facility] ORS;  Service: General;  Laterality: Left;  . BREAST LUMPECTOMY WITH SENTINEL LYMPH NODE BIOPSY Left 07/03/2016   Procedure: BREAST LUMPECTOMY WITH SENTINEL LYMPH NODE BX;  Surgeon: Earline Mayotte, MD;  Location: [facility] ORS;  Service: General;  Laterality: Left;  . hemrrhoid    . TUBAL LIGATION      FAMILY HISTORY: Family History  Problem Relation Age of Onset  . Hypertension Mother   . Cancer Father   . Heart disease Father   . Hypertension Maternal Grandmother   . Stroke Maternal Grandmother   . Breast cancer Paternal Aunt        73    ADVANCED DIRECTIVES (Y/N):  N  HEALTH MAINTENANCE: Social History  Substance Use Topics  . Smoking status: Former Smoker    Packs/day: 0.25    Years: 6.00    Types: Cigarettes    Quit date: 05/07/1987  . Smokeless tobacco: Never Used  . Alcohol use 0.0 oz/week     Comment: Occassionally     Colonoscopy:  PAP:  Bone density:  Lipid panel:  Allergies  Allergen Reactions  . Guaifenesin & Derivatives Itching, Dermatitis and Rash    Current Outpatient Prescriptions  Medication Sig Dispense Refill  . amLODipine (NORVASC) 10 MG tablet Take  0.5 tablets (5 mg total) by mouth daily. 90 tablet 1  . Ascorbic Acid (VITAMIN C PO) Take 1 tablet by mouth daily.    . Cholecalciferol (VITAMIN D-3) 5000 units TABS Take 5,000 Units by mouth daily.    . hydrochlorothiazide (HYDRODIURIL) 25 MG tablet Take 1 tablet (25 mg total) by mouth daily. 90 tablet 3  . letrozole (FEMARA) 2.5 MG tablet Take 1 tablet (2.5 mg total) by mouth daily. 30 tablet 11  .  metoprolol succinate (TOPROL-XL) 100 MG 24 hr tablet Take 1 tablet (100 mg total) by mouth daily. Take with or immediately following a meal. 90 tablet 3  . Multiple Vitamins-Minerals (ALIVE WOMENS 50+ PO) Take 1 tablet by mouth daily.    . Omega-3 Fatty Acids (FISH OIL TRIPLE STRENGTH PO) Take 1 capsule by mouth daily.    . rosuvastatin (CRESTOR) 20 MG tablet Take 1 tablet (20 mg total) by mouth daily. 90 tablet 3  . vitamin B-12 (CYANOCOBALAMIN) 500 MCG tablet Take 500 mcg by mouth daily.    . vitamin E 400 UNIT capsule Take 400 Units by mouth daily.     No current facility-administered medications for this visit.     OBJECTIVE: There were no vitals filed for this visit.   There is no height or weight on file to calculate BMI.    ECOG FS:0 - Asymptomatic  General: Well-developed, well-nourished, no acute distress. Eyes: Pink conjunctiva, anicteric sclera. Breasts: Exam deferred today. Lungs: Clear to auscultation bilaterally. Heart: Regular rate and rhythm. No rubs, murmurs, or gallops. Abdomen: Soft, nontender, nondistended. No organomegaly noted, normoactive bowel sounds. Musculoskeletal: No edema, cyanosis, or clubbing. Neuro: Alert, answering all questions appropriately. Cranial nerves grossly intact. Skin: No rashes or petechiae noted. Psych: Normal affect.   LAB RESULTS:  Lab Results  Component Value Date   NA 141 02/02/2016   K 3.6 06/27/2016   CL 102 02/02/2016   CO2 32 02/02/2016   GLUCOSE 86 02/02/2016   BUN 14 02/02/2016   CREATININE 0.87 02/02/2016   CALCIUM 10.4 02/02/2016   PROT 7.8 02/02/2016   ALBUMIN 4.7 02/02/2016   AST 16 02/02/2016   ALT 12 02/02/2016   ALKPHOS 50 02/02/2016   BILITOT 0.4 02/02/2016    Lab Results  Component Value Date   WBC 6.9 02/02/2016   NEUTROABS 3.5 05/20/2012   HGB 12.4 02/02/2016   HCT 38.0 02/02/2016   MCV 81.2 02/02/2016   PLT 263.0 02/02/2016     STUDIES: No results found.  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  PLAN:   1. 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 Patient is currently receiving MammoSite radiation. MammaPrint has been ordered and is pending at time of dictation. Given the small size and low grade of patient's tumor, this is likely going to be negative therefore patient was given a prescription for letrozole which she will take for 5 years completing in May 2023. If MammaPrint returns positive, she will return to clinic in 1-2 weeks to discuss the results. If negative, patient will return to clinic in 3 months. Will get a baseline bone mineral density in the next 1-2 weeks.  Approximately 30 minutes was spent in discussion of which greater than 50% was consultation.  Patient expressed understanding and was in agreement with this plan. She also understands that  She can call clinic at any time with any questions, concerns, or complaints.   Cancer Staging Malignant neoplasm of upper-inner quadrant of left breast in female, estrogen receptor positive ([place]) Staging form: Breast, AJCC 8th Edition - Clinical stage from 06/13/2016: Stage IB (cT2, cN0, cM0, G2, ER: Positive, PR: Positive, HER2: Negative) - Signed by Lloyd Huger, MD on 06/13/2016 - Pathologic stage from 08/04/2016: Stage IA (pT1b, pN0, cM0, G1, ER: Positive, PR: Positive, HER2: Negative) - Signed by Lloyd Huger, MD on 08/04/2016   Lloyd Huger, MD   11/07/2016 9:48 PM

## 2016-11-09 ENCOUNTER — Inpatient Hospital Stay: Payer: 59 | Admitting: Oncology

## 2016-11-10 ENCOUNTER — Emergency Department
Admission: EM | Admit: 2016-11-10 | Discharge: 2016-11-10 | Disposition: A | Discharge status: Home / Self Care | Payer: 59 | Attending: Emergency Medicine | Admitting: Emergency Medicine

## 2016-11-10 ENCOUNTER — Telehealth: Payer: Self-pay | Admitting: *Deleted

## 2016-11-10 ENCOUNTER — Emergency Department: Payer: 59

## 2016-11-10 ENCOUNTER — Encounter: Payer: Self-pay | Admitting: *Deleted

## 2016-11-10 DIAGNOSIS — Z87891 Personal history of nicotine dependence: Secondary | ICD-10-CM | POA: Insufficient documentation

## 2016-11-10 DIAGNOSIS — R0600 Dyspnea, unspecified: Secondary | ICD-10-CM | POA: Diagnosis not present

## 2016-11-10 DIAGNOSIS — Z853 Personal history of malignant neoplasm of breast: Secondary | ICD-10-CM | POA: Insufficient documentation

## 2016-11-10 DIAGNOSIS — R0602 Shortness of breath: Secondary | ICD-10-CM | POA: Diagnosis present

## 2016-11-10 DIAGNOSIS — I10 Essential (primary) hypertension: Secondary | ICD-10-CM | POA: Diagnosis not present

## 2016-11-10 LAB — BASIC METABOLIC PANEL
Anion gap: 7 (ref 5–15)
BUN: 14 mg/dL (ref 6–20)
CHLORIDE: 100 mmol/L — AB (ref 101–111)
CO2: 30 mmol/L (ref 22–32)
Calcium: 9.8 mg/dL (ref 8.9–10.3)
Creatinine, Ser: 0.86 mg/dL (ref 0.44–1.00)
GFR calc non Af Amer: >60 mL/min (ref >60)
Glucose, Bld: 138 mg/dL — ABNORMAL HIGH (ref 65–99)
POTASSIUM: 3 mmol/L — AB (ref 3.5–5.1)
SODIUM: 137 mmol/L (ref 135–145)

## 2016-11-10 LAB — CBC
HEMATOCRIT: 35.8 % (ref 35.0–47.0)
Hemoglobin: 11.8 g/dL — ABNORMAL LOW (ref 12.0–16.0)
MCH: 26.4 pg (ref 26.0–34.0)
MCHC: 33.1 g/dL (ref 32.0–36.0)
MCV: 79.8 fL — AB (ref 80.0–100.0)
Platelets: 253 10*3/uL (ref 150–440)
RBC: 4.48 MIL/uL (ref 3.80–5.20)
RDW: 13.8 % (ref 11.5–14.5)
WBC: 6.2 10*3/uL (ref 3.6–11.0)

## 2016-11-10 MED ORDER — ALBUTEROL SULFATE (2.5 MG/3ML) 0.083% IN NEBU: 5.0000 mg | INHALATION_SOLUTION | Freq: Once | RESPIRATORY_TRACT | Status: DC

## 2016-11-10 MED ORDER — DIAZEPAM 5 MG PO TABS
5.0000 mg | ORAL_TABLET | Freq: Once | ORAL | Status: AC
  Administered 2016-11-10: 5 mg via ORAL
  Filled 2016-11-10: qty 1

## 2016-11-10 MED ORDER — POTASSIUM CHLORIDE CRYS ER 20 MEQ PO TBCR
40.0000 meq | EXTENDED_RELEASE_TABLET | Freq: Once | ORAL | Status: AC
  Administered 2016-11-10: 40 meq via ORAL
  Filled 2016-11-10: qty 2

## 2016-11-10 MED ORDER — LORAZEPAM 0.5 MG PO TABS: 0.5000 mg | ORAL_TABLET | Freq: Three times a day (TID) | ORAL | 0 refills | Status: DC | PRN

## 2016-11-10 NOTE — Telephone Encounter (Signed)
Awaiting team health note

## 2016-11-10 NOTE — Telephone Encounter (Signed)
Daughter called to report that she had to pick patient up from work, pt had severe shaking and breathing issues . Daughter suspect that pt may have elevated blood pressure  * pt call transferred to team health

## 2016-11-10 NOTE — Telephone Encounter (Signed)
Patient is in the ED currently, thanks

## 2016-11-10 NOTE — Telephone Encounter (Signed)
Patient Name: Maylene [place] DOB: Aug 14, 1955 Initial Comment Caller states mother is shaking severely and breathing issues. Shortness of breath. Nurse Assessment Nurse: Melina Modena, RN, [place] Date/Time Eilene Ghazi Time): 11/10/2016 12:26:25 PM Confirm and document reason for call. If symptomatic, describe symptoms. ---Caller states mother is shaking severely and breathing issues., shortness of breath, job called daughter, an hour ago. Does the patient have any new or worsening symptoms? ---Yes Will a triage be completed? ---Yes Related visit to physician within the last 2 weeks? ---No Does the PT have any chronic conditions? (i.e. diabetes, asthma, etc.) ---No Is this a behavioral health or substance abuse call? ---No Guidelines Guideline Title Affirmed Question Affirmed Notes Breathing Difficulty [1] MODERATE difficulty breathing (e.g., speaks in phrases, SOB even at rest, pulse 100-120) AND [2] NEW-onset or WORSE than normal Final Disposition User Go to ED Now [place], Therapist, sports, [place] Comments hypertension Referrals [facility] - ED Disagree/Comply: Comply

## 2016-11-10 NOTE — ED Triage Notes (Signed)
Pt complains of shortness of breath earlier today after consuming 2 cups of coffee, pt is in no distress, or having any resp difficulty, pt denies pain

## 2016-11-10 NOTE — ED Provider Notes (Signed)
[facility] Emergency Department Provider Note       Time seen: ----------------------------------------- 6:49 PM on 11/10/2016 -----------------------------------------     I have reviewed the triage vital signs and the nursing notes.   HISTORY   Chief Complaint Shortness of Breath    HPI Olivia Kirk is a 61 y.o. female who presents to the ED for shortness of breath that started earlier today after consuming 2 cups of coffee and not eating much breakfast. Patient presents in no distress without any respiratory difficulty. She denies fevers, chills, chest pain. She states she is also under a lot of stress and has been jittery, stating she sleep well last night.   Past Medical History:  Diagnosis Date  . Anemia    H/O  . Arthritis    FINGER MIDDLE FINGER RIGHT HAND, LEFT KNEE  . Breast cancer ([place]) 06/06/2016   LEFT: T1b (6 mm), N0, 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  Marland Kitchen GERD (gastroesophageal reflux disease)    RARE  . Hyperlipidemia   . Hypertension     Patient Active Problem List   Diagnosis Date Noted  . Malignant neoplasm of upper-inner quadrant of left breast in female, estrogen receptor positive ([place]) 06/09/2016  . Prediabetes 02/09/2016  . Obesity (BMI 30-39.9) 12/23/2014  . Essential hypertension 05/13/2012  . HLD (hyperlipidemia) 05/13/2012    Past Surgical History:  Procedure Laterality Date  . BREAST BIOPSY Left 06/06/2016   left stereo. invasive mammary carcinoma  . BREAST LUMPECTOMY Left 07/03/2016   invasive mammary carcinoma  . BREAST LUMPECTOMY WITH NEEDLE LOCALIZATION Left 07/03/2016   Procedure: BREAST LUMPECTOMY WITH NEEDLE LOCALIZATION;  Surgeon: Robert Bellow, MD;  Location: [facility] ORS;  Service: General;  Laterality: Left;  . BREAST LUMPECTOMY WITH SENTINEL LYMPH NODE BIOPSY Left 07/03/2016   Procedure: BREAST LUMPECTOMY WITH SENTINEL LYMPH NODE BX;  Surgeon: Robert Bellow, MD;  Location: [facility] ORS;  Service: General;  Laterality: Left;  . hemrrhoid    . TUBAL LIGATION      Allergies Guaifenesin & derivatives  Social History Social History  Substance Use Topics  . Smoking status: Former Smoker    Packs/day: 0.25    Years: 6.00    Types: Cigarettes    Quit date: 05/07/1987  . Smokeless tobacco: Never Used  . Alcohol use 0.0 oz/week     Comment: Occassionally    Review of Systems Constitutional: Negative for fever. Eyes: Negative for vision changes ENT:  Negative for congestion, sore throat Cardiovascular: Negative for chest pain. Respiratory: positive for shortness of breath Gastrointestinal: Negative for abdominal pain, vomiting and diarrhea. Genitourinary: Negative for dysuria. Musculoskeletal: Negative for back pain. Skin: Negative for rash. Neurological: Negative for headaches, focal weakness or numbness.positive for tremor  All systems negative/normal/unremarkable except as stated in the HPI  ____________________________________________   PHYSICAL EXAM:  VITAL SIGNS: ED Triage Vitals  Enc Vitals Group     BP 11/10/16 1237 (!) 151/91     Pulse Rate 11/10/16 1237 67     Resp 11/10/16 1237 18     Temp 11/10/16 1237 97.6 F (36.4 C)     Temp Source 11/10/16 1237 Oral     SpO2 11/10/16 1237 100 %     Weight 11/10/16 1249 230 lb (104.3 kg)     Height 11/10/16 1249 6' (1.829 m)     Head Circumference --      Peak Flow --  Pain Score --      Pain Loc --      Pain Edu? --      Excl. in [place]? --     Constitutional: Alert and oriented. Well appearing and in no distress. Eyes: Conjunctivae are normal. Normal extraocular movements. ENT   Head: Normocephalic and atraumatic.   Nose: No congestion/rhinnorhea.   Mouth/Throat: Mucous membranes are moist.   Neck: No stridor. Cardiovascular: Normal rate, regular rhythm. No murmurs, rubs, or gallops. Respiratory: Normal respiratory effort without tachypnea nor  retractions. Breath sounds are clear and equal bilaterally. No wheezes/rales/rhonchi. Gastrointestinal: Soft and nontender. Normal bowel sounds Musculoskeletal: Nontender with normal range of motion in extremities. No lower extremity tenderness nor edema. Neurologic:  Normal speech and language. No gross focal neurologic deficits are appreciated. Tremulous Skin:  Skin is warm, dry and intact. No rash noted. Psychiatric: Mood and affect are normal. Speech and behavior are normal.  ____________________________________________  EKG: Interpreted by me.sinus rhythm with a rate of 60 bpm, normal PR interval, normal QRS, normal QT. Flat T waves.  ____________________________________________  ED COURSE:  Pertinent labs & imaging results that were available during my care of the patient were reviewed by me and considered in my medical decision making (see chart for details). Patient presents for shortness of breath, we will assess with labs and imaging as indicated.   Procedures ____________________________________________   LABS (pertinent positives/negatives)  Labs Reviewed  BASIC METABOLIC PANEL - Abnormal; Notable for the following:       Result Value   Potassium 3.0 (*)    Chloride 100 (*)    Glucose, Bld 138 (*)    All other components within normal limits  CBC - Abnormal; Notable for the following:    Hemoglobin 11.8 (*)    MCV 79.8 (*)    All other components within normal limits  CBG MONITORING, ED    RADIOLOGY  Chest x-ray is normal  ____________________________________________  FINAL ASSESSMENT AND PLAN  Dyspnea  Plan: Patient's labs and imaging were dictated above. Patient had presented for shortness of breath of uncertain etiology. This is likely multifactorial with stress, sleep loss and caffeine intake with poor food intake. We give her a dose of Valium and potassium here and she is currently asymptomatic. She'll be discharged with similar, encouraged decrease  caffeine intake as well as sleep aids. She is stable for discharge.   Earleen Newport, MD   Note: This note was generated in part or whole with voice recognition software. Voice recognition is usually quite accurate but there are transcription errors that can and very often do occur. I apologize for any typographical errors that were not detected and corrected.     Earleen Newport, MD 11/10/16 [phone]

## 2016-11-13 ENCOUNTER — Telehealth: Payer: Self-pay | Admitting: Family Medicine

## 2016-11-13 NOTE — Telephone Encounter (Signed)
Team health Note received verified patient seen in ED.

## 2016-12-27 ENCOUNTER — Encounter: Payer: Self-pay | Admitting: *Deleted

## 2017-01-23 ENCOUNTER — Ambulatory Visit: Payer: 59 | Admitting: Family Medicine

## 2017-04-26 ENCOUNTER — Ambulatory Visit: Payer: Self-pay | Attending: Radiation Oncology | Admitting: Radiation Oncology

## 2017-08-27 ENCOUNTER — Ambulatory Visit: Payer: BLUE CROSS/BLUE SHIELD | Admitting: Family Medicine

## 2017-08-27 ENCOUNTER — Encounter: Payer: Self-pay | Admitting: Family Medicine

## 2017-08-27 VITALS — BP 140/90 | HR 78 | Temp 98.6°F | Ht 71.0 in | Wt 238.5 lb

## 2017-08-27 DIAGNOSIS — Z1231 Encounter for screening mammogram for malignant neoplasm of breast: Secondary | ICD-10-CM

## 2017-08-27 DIAGNOSIS — E785 Hyperlipidemia, unspecified: Secondary | ICD-10-CM

## 2017-08-27 DIAGNOSIS — I10 Essential (primary) hypertension: Secondary | ICD-10-CM

## 2017-08-27 DIAGNOSIS — F419 Anxiety disorder, unspecified: Secondary | ICD-10-CM

## 2017-08-27 DIAGNOSIS — Z1239 Encounter for other screening for malignant neoplasm of breast: Secondary | ICD-10-CM

## 2017-08-27 DIAGNOSIS — R7303 Prediabetes: Secondary | ICD-10-CM | POA: Diagnosis not present

## 2017-08-27 DIAGNOSIS — C50912 Malignant neoplasm of unspecified site of left female breast: Secondary | ICD-10-CM

## 2017-08-27 MED ORDER — SERTRALINE HCL 50 MG PO TABS: ORAL_TABLET | ORAL | 3 refills | Status: DC

## 2017-08-27 NOTE — Patient Instructions (Addendum)
Good to meet you today  Please stop at desk to schedule your mammogram and return visit for complete physical in 2-3 months  Decrease your sugar intake, limit dessert to once a week. Eat more vegetables, fruits and lean proteins.    Living With Anxiety After being diagnosed with an anxiety disorder, you may be relieved to know why you have felt or behaved a certain way. It is natural to also feel overwhelmed about the treatment ahead and what it will mean for your life. With care and support, you can manage this condition and recover from it. How to cope with anxiety Dealing with stress Stress is your body's reaction to life changes and events, both good and bad. Stress can last just a few hours or it can be ongoing. Stress can play a major role in anxiety, so it is important to learn both how to cope with stress and how to think about it differently. Talk with your health care provider or a counselor to learn more about stress reduction. He or she may suggest some stress reduction techniques, such as:  Music therapy. This can include creating or listening to music that you enjoy and that inspires you.  Mindfulness-based meditation. This involves being aware of your normal breaths, rather than trying to control your breathing. It can be done while sitting or walking.  Centering prayer. This is a kind of meditation that involves focusing on a word, phrase, or sacred image that is meaningful to you and that brings you peace.  Deep breathing. To do this, expand your stomach and inhale slowly through your nose. Hold your breath for 3-5 seconds. Then exhale slowly, allowing your stomach muscles to relax.  Self-talk. This is a skill where you identify thought patterns that lead to anxiety reactions and correct those thoughts.  Muscle relaxation. This involves tensing muscles then relaxing them.  Choose a stress reduction technique that fits your lifestyle and personality. Stress reduction  techniques take time and practice. Set aside 5-15 minutes a day to do them. Therapists can offer training in these techniques. The training may be covered by some insurance plans. Other things you can do to manage stress include:  Keeping a stress diary. This can help you learn what triggers your stress and ways to control your response.  Thinking about how you respond to certain situations. You may not be able to control everything, but you can control your reaction.  Making time for activities that help you relax, and not feeling guilty about spending your time in this way.  Therapy combined with coping and stress-reduction skills provides the best chance for successful treatment. Medicines Medicines can help ease symptoms. Medicines for anxiety include:  Anti-anxiety drugs.  Antidepressants.  Beta-blockers.  Medicines may be used as the main treatment for anxiety disorder, along with therapy, or if other treatments are not working. Medicines should be prescribed by a health care provider. Relationships Relationships can play a big part in helping you recover. Try to spend more time connecting with trusted friends and family members. Consider going to couples counseling, taking family education classes, or going to family therapy. Therapy can help you and others better understand the condition. How to recognize changes in your condition Everyone has a different response to treatment for anxiety. Recovery from anxiety happens when symptoms decrease and stop interfering with your daily activities at home or work. This may mean that you will start to:  Have better concentration and focus.  Sleep better.  Be less irritable.  Have more energy.  Have improved memory.  It is important to recognize when your condition is getting worse. Contact your health care provider if your symptoms interfere with home or work and you do not feel like your condition is improving. Where to find help  and support: You can get help and support from these sources:  Self-help groups.  Online and [employer].  A trusted spiritual leader.  Couples counseling.  Family education classes.  Family therapy.  Follow these instructions at home:  Eat a healthy diet that includes plenty of vegetables, fruits, whole grains, low-fat dairy products, and lean protein. Do not eat a lot of foods that are high in solid fats, added sugars, or salt.  Exercise. Most adults should do the following: ? Exercise for at least 150 minutes each week. The exercise should increase your heart rate and make you sweat (moderate-intensity exercise). ? Strengthening exercises at least twice a week.  Cut down on caffeine, tobacco, alcohol, and other potentially harmful substances.  Get the right amount and quality of sleep. Most adults need 7-9 hours of sleep each night.  Make choices that simplify your life.  Take over-the-counter and prescription medicines only as told by your health care provider.  Avoid caffeine, alcohol, and certain over-the-counter cold medicines. These may make you feel worse. Ask your pharmacist which medicines to avoid.  Keep all follow-up visits as told by your health care provider. This is important. Questions to ask your health care provider  Would I benefit from therapy?  How often should I follow up with a health care provider?  How long do I need to take medicine?  Are there any long-term side effects of my medicine?  Are there any alternatives to taking medicine? Contact a health care provider if:  You have a hard time staying focused or finishing daily tasks.  You spend many hours a day feeling worried about everyday life.  You become exhausted by worry.  You start to have headaches, feel tense, or have nausea.  You urinate more than normal.  You have diarrhea. Get help right away if:  You have a racing heart and shortness of breath.  You have  thoughts of hurting yourself or others. If you ever feel like you may hurt yourself or others, or have thoughts about taking your own life, get help right away. You can go to your nearest emergency department or call:  Your local emergency services (911 in the [place]  A suicide crisis helpline, such as the [place] at (954)331-2518. This is open 24-hours a day.  Summary  Taking steps to deal with stress can help calm you.  Medicines cannot cure anxiety disorders, but they can help ease symptoms.  Family, friends, and partners can play a big part in helping you recover from an anxiety disorder. This information is not intended to replace advice given to you by your health care provider. Make sure you discuss any questions you have with your health care provider. Document Released: 02/29/2016 Document Revised: 02/29/2016 Document Reviewed: 02/29/2016 Elsevier Interactive Patient Education  2018 [place] refers to food and lifestyle choices that are based on the traditions of countries located on the [employer]. This way of eating has been shown to help prevent certain conditions and improve outcomes for people who have chronic diseases, like kidney disease and heart disease. What are tips for following this plan? Lifestyle  Cook and eat meals together with your family, when possible.  Drink enough fluid to keep your urine clear or pale yellow.  Be physically active every day. This includes: ? Aerobic exercise like running or swimming. ? Leisure activities like gardening, walking, or housework.  Get 7-8 hours of sleep each night.  If recommended by your health care provider, drink red wine in moderation. This means 1 glass a day for nonpregnant women and 2 glasses a day for men. A glass of wine equals 5 oz (150 mL). Reading food labels  Check the serving size of packaged foods. For foods such as  rice and pasta, the serving size refers to the amount of cooked product, not dry.  Check the total fat in packaged foods. Avoid foods that have saturated fat or trans fats.  Check the ingredients list for added sugars, such as corn syrup. Shopping  At the grocery store, buy most of your food from the areas near the walls of the store. This includes: ? Fresh fruits and vegetables (produce). ? Grains, beans, nuts, and seeds. Some of these may be available in unpackaged forms or large amounts (in bulk). ? Fresh seafood. ? Poultry and eggs. ? Low-fat dairy products.  Buy whole ingredients instead of prepackaged foods.  Buy fresh fruits and vegetables in-season from local farmers markets.  Buy frozen fruits and vegetables in resealable bags.  If you do not have access to quality fresh seafood, buy precooked frozen shrimp or canned fish, such as tuna, salmon, or sardines.  Buy small amounts of raw or cooked vegetables, salads, or olives from the deli or salad bar at your store.  Stock your pantry so you always have certain foods on hand, such as olive oil, canned tuna, canned tomatoes, rice, pasta, and beans. Cooking  Cook foods with extra-virgin olive oil instead of using butter or other vegetable oils.  Have meat as a side dish, and have vegetables or grains as your main dish. This means having meat in small portions or adding small amounts of meat to foods like pasta or stew.  Use beans or vegetables instead of meat in common dishes like chili or lasagna.  Experiment with different cooking methods. Try roasting or broiling vegetables instead of steaming or sauteing them.  Add frozen vegetables to soups, stews, pasta, or rice.  Add nuts or seeds for added healthy fat at each meal. You can add these to yogurt, salads, or vegetable dishes.  Marinate fish or vegetables using olive oil, lemon juice, garlic, and fresh herbs. Meal planning  Plan to eat 1 vegetarian meal one day each  week. Try to work up to 2 vegetarian meals, if possible.  Eat seafood 2 or more times a week.  Have healthy snacks readily available, such as: ? Vegetable sticks with hummus. ? [place] yogurt. ? Fruit and nut trail mix.  Eat balanced meals throughout the week. This includes: ? Fruit: 2-3 servings a day ? Vegetables: 4-5 servings a day ? Low-fat dairy: 2 servings a day ? Fish, poultry, or lean meat: 1 serving a day ? Beans and legumes: 2 or more servings a week ? Nuts and seeds: 1-2 servings a day ? Whole grains: 6-8 servings a day ? Extra-virgin olive oil: 3-4 servings a day  Limit red meat and sweets to only a few servings a month What are my food choices?  Mediterranean diet ? Recommended ? Grains: Whole-grain pasta. Brown rice. Bulgar wheat. Polenta. Couscous. Whole-wheat bread. [place]. ?  Vegetables: Artichokes. Beets. Broccoli. Cabbage. Carrots. Eggplant. Green beans. Chard. Kale. Spinach. Onions. Leeks. Peas. Squash. Tomatoes. Peppers. Radishes. ? Fruits: Apples. Apricots. Avocado. Berries. Bananas. Cherries. Dates. Figs. Grapes. Lemons. Melon. Oranges. Peaches. Plums. Pomegranate. ? Meats and other protein foods: Beans. Almonds. Sunflower seeds. Pine nuts. Peanuts. Plush. Salmon. Scallops. Shrimp. [place]. Tilapia. Clams. Oysters. Eggs. ? Dairy: Low-fat milk. Cheese. Greek yogurt. ? Beverages: Water. Red wine. Herbal tea. ? Fats and oils: Extra virgin olive oil. Avocado oil. Grape seed oil. ? Sweets and desserts: [place] yogurt with honey. Baked apples. Poached pears. Trail mix. ? Seasoning and other foods: Basil. Cilantro. Coriander. Cumin. Mint. Parsley. Sage. Rosemary. Tarragon. Garlic. Oregano. Thyme. Pepper. Balsalmic vinegar. Tahini. Hummus. Tomato sauce. Olives. Mushrooms. ? Limit these ? Grains: Prepackaged pasta or rice dishes. Prepackaged cereal with added sugar. ? Vegetables: Deep fried potatoes (french fries). ? Fruits: Fruit canned in syrup. ? Meats and other  protein foods: Beef. Pork. Lamb. Poultry with skin. Hot dogs. [place] Salines. ? Dairy: Ice cream. Sour cream. Whole milk. ? Beverages: Juice. Sugar-sweetened soft drinks. Beer. Liquor and spirits. ? Fats and oils: Butter. Canola oil. Vegetable oil. Beef fat (tallow). Lard. ? Sweets and desserts: Cookies. Cakes. Pies. Candy. ? Seasoning and other foods: Mayonnaise. Premade sauces and marinades. ? The items listed may not be a complete list. Talk with your dietitian about what dietary choices are right for you. Summary  The Mediterranean diet includes both food and lifestyle choices.  Eat a variety of fresh fruits and vegetables, beans, nuts, seeds, and whole grains.  Limit the amount of red meat and sweets that you eat.  Talk with your health care provider about whether it is safe for you to drink red wine in moderation. This means 1 glass a day for nonpregnant women and 2 glasses a day for men. A glass of wine equals 5 oz (150 mL). This information is not intended to replace advice given to you by your health care provider. Make sure you discuss any questions you have with your health care provider. Document Released: 10/28/2015 Document Revised: 11/30/2015 Document Reviewed: 10/28/2015 Elsevier Interactive Patient Education  [employer].

## 2017-08-27 NOTE — Progress Notes (Signed)
Subjective:    Patient ID: Olivia Kirk, female    DOB: 08-18-1955, 62 y.o.   MRN: [id number]  HPI This is a 62 yo female who presents today to establish care. Prior patient of Dr. Lacinda Axon.  She retired 10/18. Works part time for [employer]. Admits to not taking care of herself recently, since retirement, she doesn't have funds to take care of herself. Has stopped several of her medications due to cost.  Has 4 grown children. Son lives with her and her husband. Increased stress with son's situation, he is underemployed, doesn't drive. .  Enjoys playing games on her phone, movies.   Feels anxious, wound up, not down or depressed. Previously on lorazepam. Feels like a daily medication would be helpful.   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 She received radiation and was given a prescription for letrozole which she has been unable to afford. She did not return for follow up as was advised. Last screening mammo 05/11/16.   Last CPE- unknown Mammo- normal screening 05/11/16 Bone density- 08/09/16- osteopenic Pap- unknown Colonoscopy- 06/03/12 Tdap- 05/13/12 Flu- most years Eye- last year Dental- not regularly, has full dentures Exercise- not regular Sleep- 6 hours, trouble going and staying asleep  No chest pain, some DOE, chronic constipation relieved with stool softener, no dysuria, no hematuria, left heel pain, occasional knee pain, occasional headache.     Past Medical History:  Diagnosis Date  . Anemia    H/O  . Arthritis    FINGER MIDDLE FINGER RIGHT HAND, LEFT KNEE  . Breast cancer ([place]) 06/06/2016   LEFT: T1b (6 mm), N0, 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  [place] Kitchen GERD (gastroesophageal reflux disease)    RARE  . Hyperlipidemia   . Hypertension    Past Surgical History:  Procedure Laterality Date  . BREAST BIOPSY Left 06/06/2016   left stereo. invasive mammary carcinoma  .  BREAST LUMPECTOMY Left 07/03/2016   invasive mammary carcinoma  . BREAST LUMPECTOMY WITH NEEDLE LOCALIZATION Left 07/03/2016   Procedure: BREAST LUMPECTOMY WITH NEEDLE LOCALIZATION;  Surgeon: Robert Bellow, MD;  Location: [facility] ORS;  Service: General;  Laterality: Left;  . BREAST LUMPECTOMY WITH SENTINEL LYMPH NODE BIOPSY Left 07/03/2016   Procedure: BREAST LUMPECTOMY WITH SENTINEL LYMPH NODE BX;  Surgeon: Robert Bellow, MD;  Location: [facility] ORS;  Service: General;  Laterality: Left;  . hemrrhoid    . TUBAL LIGATION     Family History  Problem Relation Age of Onset  . Hypertension Mother   . Cancer Father   . Heart disease Father   . Hypertension Maternal Grandmother   . Stroke Maternal Grandmother   . Breast cancer Paternal Aunt        40   Social History   Tobacco Use  . Smoking status: Former Smoker    Packs/day: 0.25    Years: 6.00    Pack years: 1.50    Types: Cigarettes    Last attempt to quit: 05/07/1987    Years since quitting: 30.3  . Smokeless tobacco: Never Used  Substance Use Topics  . Alcohol use: Yes    Alcohol/week: 0.0 oz    Comment: Occassionally  . Drug use: No      Review of Systems Per HPI    Objective:   Physical Exam Physical Exam  Constitutional: Oriented to person, place, and time. She appears well-developed and well-nourished.  HENT:  Head: Normocephalic and atraumatic.  Eyes: Conjunctivae are normal.  Neck: Normal range of motion. Neck supple.  Cardiovascular: Normal rate, regular rhythm and normal heart sounds.   Pulmonary/Chest: Effort normal and breath sounds normal.  Musculoskeletal: Normal range of motion.  Neurological: Alert and oriented to person, place, and time.  Skin: Skin is warm and dry.  Psychiatric: Normal mood and affect. Behavior is normal. Judgment and thought content normal.  Vitals reviewed.     BP (!) 140/100   Pulse 78   Temp 98.6 F (37 C) (Oral)   Ht _0  (1.803 m)   Wt 238 lb 8 oz (108.2 kg)    BMI 33.26 kg/m  Wt Readings from Last 3 Encounters:  08/27/17 238 lb 8 oz (108.2 kg)  11/10/16 230 lb (104.3 kg)  10/23/16 229 lb 8 oz (104.1 kg)   BP Readings from Last 3 Encounters:  08/27/17 140/90  11/10/16 127/79  10/23/16 (!) 134/94       Assessment & Plan:  1. Screening for breast cancer - MM Digital Screening; Future  2. Essential hypertension - improved on recheck today - CBC with Differential - Comprehensive metabolic panel - TSH - follow up in 2-3 months for CPE  3. Prediabetes - discussed healthy food choices and importance of increased activity, encouraged her to walk daily, starting with 10-15 minutes and working up to 30 minutes - Hemoglobin A1c  4. Hyperlipidemia, unspecified hyperlipidemia type - TSH - Hemoglobin A1c - Lipid Panel  5. Anxiety - discussed non pharmacologic means of dealing with anxiety  - discussed medication use and expectations of treatment - sertraline (ZOLOFT) 50 MG tablet; Take 1/2 tablet by mouth every night for 7 nights then take 1 tablet nightly.  Dispense: 30 tablet; Refill: 3  6. Invasive ductal carcinoma of breast, left ([place]) - she has stopped her letrozole, will get screening mammo and labs and discuss oncology follow up at CPE visit   Clarene Reamer, FNP-BC  [facility], [place]  08/27/2017 10:21 AM

## 2017-08-29 ENCOUNTER — Other Ambulatory Visit (INDEPENDENT_AMBULATORY_CARE_PROVIDER_SITE_OTHER): Payer: BLUE CROSS/BLUE SHIELD

## 2017-08-29 DIAGNOSIS — R7303 Prediabetes: Secondary | ICD-10-CM

## 2017-08-29 DIAGNOSIS — F419 Anxiety disorder, unspecified: Secondary | ICD-10-CM

## 2017-08-29 DIAGNOSIS — C50912 Malignant neoplasm of unspecified site of left female breast: Secondary | ICD-10-CM

## 2017-08-29 DIAGNOSIS — E785 Hyperlipidemia, unspecified: Secondary | ICD-10-CM | POA: Diagnosis not present

## 2017-08-29 DIAGNOSIS — I10 Essential (primary) hypertension: Secondary | ICD-10-CM | POA: Diagnosis not present

## 2017-08-29 LAB — CBC WITH DIFFERENTIAL/PLATELET
Basophils Absolute: 0.1 K/uL (ref 0.0–0.1)
Basophils Relative: 1.1 % (ref 0.0–3.0)
Eosinophils Absolute: 0.1 K/uL (ref 0.0–0.7)
Eosinophils Relative: 1.5 % (ref 0.0–5.0)
HCT: 36.1 % (ref 36.0–46.0)
Hemoglobin: 12 g/dL (ref 12.0–15.0)
Lymphocytes Relative: 33.5 % (ref 12.0–46.0)
Lymphs Abs: 2.4 K/uL (ref 0.7–4.0)
MCHC: 33.2 g/dL (ref 30.0–36.0)
MCV: 80.8 fl (ref 78.0–100.0)
Monocytes Absolute: 0.7 K/uL (ref 0.1–1.0)
Monocytes Relative: 9.3 % (ref 3.0–12.0)
Neutro Abs: 3.9 K/uL (ref 1.4–7.7)
Neutrophils Relative %: 54.6 % (ref 43.0–77.0)
Platelets: 279 K/uL (ref 150.0–400.0)
RBC: 4.46 Mil/uL (ref 3.87–5.11)
RDW: 13.6 % (ref 11.5–15.5)
WBC: 7.2 K/uL (ref 4.0–10.5)

## 2017-08-29 LAB — COMPREHENSIVE METABOLIC PANEL
ALK PHOS: 52 U/L (ref 39–117)
ALT: 12 U/L (ref 0–35)
AST: 17 U/L (ref 0–37)
Albumin: 4.2 g/dL (ref 3.5–5.2)
BUN: 15 mg/dL (ref 6–23)
CHLORIDE: 103 meq/L (ref 96–112)
CO2: 29 mEq/L (ref 19–32)
Calcium: 10.2 mg/dL (ref 8.4–10.5)
Creatinine, Ser: 1.03 mg/dL (ref 0.40–1.20)
GFR: 69.66 mL/min (ref >60.00)
GLUCOSE: 94 mg/dL (ref 70–99)
POTASSIUM: 3.5 meq/L (ref 3.5–5.1)
SODIUM: 140 meq/L (ref 135–145)
TOTAL PROTEIN: 7 g/dL (ref 6.0–8.3)
Total Bilirubin: 0.3 mg/dL (ref 0.2–1.2)

## 2017-08-29 LAB — LIPID PANEL
Cholesterol: 259 mg/dL — ABNORMAL HIGH (ref 0–200)
HDL: 38.4 mg/dL — ABNORMAL LOW (ref >39.00)
NonHDL: 220.54
Total CHOL/HDL Ratio: 7
Triglycerides: 223 mg/dL — ABNORMAL HIGH (ref 0.0–149.0)
VLDL: 44.6 mg/dL — AB (ref 0.0–40.0)

## 2017-08-29 LAB — LDL CHOLESTEROL, DIRECT: Direct LDL: 173 mg/dL

## 2017-08-29 LAB — HEMOGLOBIN A1C: Hgb A1c MFr Bld: 6.6 % — ABNORMAL HIGH (ref 4.6–6.5)

## 2017-08-29 LAB — TSH: TSH: 0.89 u[IU]/mL (ref 0.35–4.50)

## 2017-09-04 MED ORDER — LETROZOLE 2.5 MG PO TABS: 2.5000 mg | ORAL_TABLET | Freq: Every day | ORAL | 3 refills | Status: DC

## 2017-09-04 MED ORDER — ATORVASTATIN CALCIUM 40 MG PO TABS: 40.0000 mg | ORAL_TABLET | Freq: Every day | ORAL | 3 refills | Status: DC

## 2017-09-04 NOTE — Addendum Note (Signed)
Addended by: Clarene Reamer B on: 09/04/2017 05:13 AM   Modules accepted: Orders

## 2017-09-30 NOTE — Progress Notes (Signed)
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  INTERVAL HISTORY: Patient was last evaluated in clinic in June 2018.  She was lost to follow-up secondary to insurance purposes and financial difficulties.  She returns to clinic today to reestablish care.  She is currently on letrozole and tolerating it well.  She currently feels well and is asymptomatic. She has no neurologic complaints. She denies any recent fevers or illnesses. She has a good appetite and denies weight loss. She has no chest pain or shortness of breath. She denies any nausea, vomiting, constipation, or diarrhea. She has no urinary complaints.  Patient feels at her baseline offers no specific complaints today.    REVIEW OF SYSTEMS:   Review of Systems  Constitutional: Negative.  Negative for fever, malaise/fatigue and weight loss.  Respiratory: Negative.  Negative for cough and shortness of breath.   Cardiovascular: Negative.  Negative for chest pain and leg swelling.  Gastrointestinal: Negative.  Negative for abdominal pain and constipation.  Genitourinary: Negative.  Negative for dysuria.  Musculoskeletal: Negative.  Negative for back pain.  Skin: Negative.  Negative for rash.  Neurological: Negative.  Negative for sensory change, focal weakness, weakness and headaches.  Psychiatric/Behavioral: Negative.  The patient is not nervous/anxious.     As per HPI. Otherwise, a complete review of systems is negative.  PAST MEDICAL HISTORY: Past Medical History:  Diagnosis Date  . Anemia    H/O  . Arthritis    FINGER MIDDLE FINGER RIGHT HAND, LEFT  KNEE  . Breast cancer ([place]) 06/06/2016   LEFT: T1b (6 mm), N0, 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  Marland Kitchen GERD (gastroesophageal reflux disease)    RARE  . Hyperlipidemia   . Hypertension     PAST SURGICAL HISTORY: Past Surgical History:  Procedure Laterality Date  . BREAST BIOPSY Left 06/06/2016   left stereo. invasive mammary carcinoma  . BREAST LUMPECTOMY Left 07/03/2016   invasive mammary carcinoma  . BREAST LUMPECTOMY WITH NEEDLE LOCALIZATION Left 07/03/2016   Procedure: BREAST LUMPECTOMY WITH NEEDLE LOCALIZATION;  Surgeon: Robert Bellow, MD;  Location: [facility] ORS;  Service: General;  Laterality: Left;  . BREAST LUMPECTOMY WITH SENTINEL LYMPH NODE BIOPSY Left 07/03/2016   Procedure: BREAST LUMPECTOMY WITH SENTINEL LYMPH NODE BX;  Surgeon: Robert Bellow, MD;  Location: [facility] ORS;  Service: General;  Laterality: Left;  . hemrrhoid    . TUBAL LIGATION      FAMILY HISTORY: Family History  Problem Relation Age of Onset  . Hypertension Mother   . Cancer Father   . Heart disease Father   . Hypertension Maternal Grandmother   . Stroke Maternal Grandmother   . Breast cancer Paternal Aunt        64    ADVANCED DIRECTIVES (Y/N):  N  HEALTH MAINTENANCE: Social History   Tobacco Use  . Smoking status: Former Smoker    Packs/day: 0.25    Years: 6.00    Pack years: 1.50    Types: Cigarettes    Last attempt to quit: 05/07/1987    Years since quitting: 30.4  . Smokeless tobacco: Never Used  Substance Use Topics  .  Alcohol use: Yes    Alcohol/week: 0.0 oz    Comment: Occassionally  . Drug use: No     Colonoscopy:  PAP:  Bone density:  Lipid panel:  Allergies  Allergen Reactions  . Guaifenesin & Derivatives Itching, Dermatitis and Rash    Current Outpatient Medications  Medication Sig Dispense Refill  . amLODipine (NORVASC) 5 MG tablet Take 5 mg by mouth daily.    Marland Kitchen atorvastatin (LIPITOR) 40 MG tablet Take 1 tablet (40 mg total)  by mouth daily. 90 tablet 3  . Cholecalciferol (VITAMIN D3) 1000 units CAPS Take by mouth.    . docusate sodium (STOOL SOFTENER) 100 MG capsule Take 100 mg by mouth daily as needed for mild constipation.    . hydrochlorothiazide (HYDRODIURIL) 25 MG tablet Take 1 tablet (25 mg total) by mouth daily. 90 tablet 3  . letrozole (FEMARA) 2.5 MG tablet Take 1 tablet (2.5 mg total) by mouth daily. 90 tablet 3  . metoprolol succinate (TOPROL-XL) 100 MG 24 hr tablet Take 1 tablet (100 mg total) by mouth daily. Take with or immediately following a meal. 90 tablet 3  . Multiple Vitamins-Minerals (HAIR SKIN AND NAILS FORMULA) TABS Take by mouth.    . sertraline (ZOLOFT) 50 MG tablet Take 1/2 tablet by mouth every night for 7 nights then take 1 tablet nightly. 30 tablet 3  . vitamin B-12 (CYANOCOBALAMIN) 1000 MCG tablet Take 1,000 mcg by mouth daily.     No current facility-administered medications for this visit.     OBJECTIVE: Vitals:   10/01/17 1022  BP: (!) 141/78  Pulse: 69  Resp: 18  Temp: 97.6 F (36.4 C)     Body mass index is 33.11 kg/m.    ECOG FS:0 - Asymptomatic  General: Well-developed, well-nourished, no acute distress. Eyes: Pink conjunctiva, anicteric sclera. HEENT: Normocephalic, moist mucous membranes. Breast: Bilateral breast and axilla without lumps or masses. Lungs: Clear to auscultation bilaterally. Heart: Regular rate and rhythm. No rubs, murmurs, or gallops. Abdomen: Soft, nontender, nondistended. No organomegaly noted, normoactive bowel sounds. Musculoskeletal: No edema, cyanosis, or clubbing. Neuro: Alert, answering all questions appropriately. Cranial nerves grossly intact. Skin: No rashes or petechiae noted. Psych: Normal affect.  LAB RESULTS:  Lab Results  Component Value Date   NA 140 08/29/2017   K 3.5 08/29/2017   CL 103 08/29/2017   CO2 29 08/29/2017   GLUCOSE 94 08/29/2017   BUN 15 08/29/2017   CREATININE 1.03 08/29/2017   CALCIUM 10.2 08/29/2017    PROT 7.0 08/29/2017   ALBUMIN 4.2 08/29/2017   AST 17 08/29/2017   ALT 12 08/29/2017   ALKPHOS 52 08/29/2017   BILITOT 0.3 08/29/2017   GFRNONAA >60 11/10/2016   GFRAA >60 11/10/2016    Lab Results  Component Value Date   WBC 7.2 08/29/2017   NEUTROABS 3.9 08/29/2017   HGB 12.0 08/29/2017   HCT 36.1 08/29/2017   MCV 80.8 08/29/2017   PLT 279.0 08/29/2017     STUDIES: No results found.  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  PLAN:   1. 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  Patient underwent MammoSite radiation in June 2018.  She was given a prescription for letrozole at that time, but then subsequently lost to follow-up. This is been recently reinitiated.  Patient will require a total  of 5 years of treatment completing an June 2024.  Patient will require mammogram in the next 1 to 2 weeks.  Return to clinic in 3 months for further evaluation at which time patient can likely be transitioned to evaluation every 6 months. 2.  Osteopenia: Patient had a baseline bone mineral density on Aug 09, 2016 that reported T score of -1.1.  Recommended calcium and vitamin D supplementation.  Repeat in May 2020.    I spent a total of 30 minutes face-to-face with the patient of which greater than 50% of the visit was spent in counseling and coordination of care as detailed above.   Patient expressed understanding and was in agreement with this plan. She also understands that She can call clinic at any time with any questions, concerns, or complaints.   Cancer Staging Malignant neoplasm of upper-inner quadrant of left breast in female, estrogen receptor positive ([place]) Staging form: Breast, AJCC 8th Edition - Clinical stage from 06/13/2016: Stage IB (cT2, cN0, cM0, G2, ER: Positive,  PR: Positive, HER2: Negative) - Signed by Lloyd Huger, MD on 06/13/2016 - Pathologic stage from 08/04/2016: Stage IA (pT1b, pN0, cM0, G1, ER: Positive, PR: Positive, HER2: Negative) - Signed by Lloyd Huger, MD on 08/04/2016   Lloyd Huger, MD   10/05/2017 11:37 AM

## 2017-10-01 ENCOUNTER — Encounter: Payer: Self-pay | Admitting: Oncology

## 2017-10-01 ENCOUNTER — Inpatient Hospital Stay: Payer: BLUE CROSS/BLUE SHIELD | Attending: Oncology | Admitting: Oncology

## 2017-10-01 VITALS — BP 141/78 | HR 69 | Temp 97.6°F | Resp 18 | Wt 237.4 lb

## 2017-10-01 DIAGNOSIS — Z87891 Personal history of nicotine dependence: Secondary | ICD-10-CM | POA: Diagnosis not present

## 2017-10-01 DIAGNOSIS — I10 Essential (primary) hypertension: Secondary | ICD-10-CM

## 2017-10-01 DIAGNOSIS — Z170 Estrogen receptor positive status [ER+]: Secondary | ICD-10-CM | POA: Diagnosis not present

## 2017-10-01 DIAGNOSIS — M8580 Other specified disorders of bone density and structure, unspecified site: Secondary | ICD-10-CM

## 2017-10-01 DIAGNOSIS — Z803 Family history of malignant neoplasm of breast: Secondary | ICD-10-CM | POA: Diagnosis not present

## 2017-10-01 DIAGNOSIS — Z79811 Long term (current) use of aromatase inhibitors: Secondary | ICD-10-CM | POA: Insufficient documentation

## 2017-10-01 DIAGNOSIS — C50212 Malignant neoplasm of upper-inner quadrant of left female breast: Secondary | ICD-10-CM | POA: Insufficient documentation

## 2017-10-01 NOTE — Progress Notes (Signed)
Patient here today for follow up, denies concerns.  

## 2017-10-03 ENCOUNTER — Other Ambulatory Visit: Payer: Self-pay | Admitting: Family Medicine

## 2017-10-03 DIAGNOSIS — F419 Anxiety disorder, unspecified: Secondary | ICD-10-CM

## 2017-10-07 ENCOUNTER — Other Ambulatory Visit: Payer: Self-pay | Admitting: Family Medicine

## 2017-10-09 ENCOUNTER — Other Ambulatory Visit: Payer: BLUE CROSS/BLUE SHIELD

## 2017-10-15 ENCOUNTER — Ambulatory Visit
Admission: RE | Admit: 2017-10-15 | Discharge: 2017-10-15 | Disposition: A | Discharge status: Home / Self Care | Payer: BLUE CROSS/BLUE SHIELD | Source: Ambulatory Visit | Attending: Oncology | Admitting: Oncology

## 2017-10-15 DIAGNOSIS — C50212 Malignant neoplasm of upper-inner quadrant of left female breast: Secondary | ICD-10-CM

## 2017-10-15 DIAGNOSIS — Z170 Estrogen receptor positive status [ER+]: Principal | ICD-10-CM

## 2017-10-15 DIAGNOSIS — R928 Other abnormal and inconclusive findings on diagnostic imaging of breast: Secondary | ICD-10-CM | POA: Diagnosis not present

## 2017-10-15 DIAGNOSIS — Z853 Personal history of malignant neoplasm of breast: Secondary | ICD-10-CM | POA: Diagnosis not present

## 2017-10-15 HISTORY — DX: Personal history of irradiation: Z92.3

## 2017-11-21 ENCOUNTER — Other Ambulatory Visit: Payer: BLUE CROSS/BLUE SHIELD

## 2017-11-28 ENCOUNTER — Encounter: Payer: Self-pay | Admitting: Family Medicine

## 2017-11-28 ENCOUNTER — Other Ambulatory Visit (HOSPITAL_COMMUNITY)
Admission: RE | Admit: 2017-11-28 | Discharge: 2017-11-28 | Disposition: A | Discharge status: Home / Self Care | Payer: BLUE CROSS/BLUE SHIELD | Source: Ambulatory Visit | Attending: Family Medicine | Admitting: Family Medicine

## 2017-11-28 ENCOUNTER — Ambulatory Visit (INDEPENDENT_AMBULATORY_CARE_PROVIDER_SITE_OTHER): Payer: BLUE CROSS/BLUE SHIELD | Admitting: Family Medicine

## 2017-11-28 VITALS — BP 148/78 | HR 60 | Temp 97.9°F | Ht 71.0 in | Wt 236.0 lb

## 2017-11-28 DIAGNOSIS — R202 Paresthesia of skin: Secondary | ICD-10-CM

## 2017-11-28 DIAGNOSIS — Z124 Encounter for screening for malignant neoplasm of cervix: Secondary | ICD-10-CM

## 2017-11-28 DIAGNOSIS — Z1151 Encounter for screening for human papillomavirus (HPV): Secondary | ICD-10-CM | POA: Diagnosis not present

## 2017-11-28 DIAGNOSIS — E785 Hyperlipidemia, unspecified: Secondary | ICD-10-CM

## 2017-11-28 DIAGNOSIS — Z0000 Encounter for general adult medical examination without abnormal findings: Secondary | ICD-10-CM

## 2017-11-28 DIAGNOSIS — Z23 Encounter for immunization: Secondary | ICD-10-CM

## 2017-11-28 DIAGNOSIS — C50912 Malignant neoplasm of unspecified site of left female breast: Secondary | ICD-10-CM

## 2017-11-28 MED ORDER — LETROZOLE 2.5 MG PO TABS: 2.5000 mg | ORAL_TABLET | Freq: Every day | ORAL | 3 refills | Status: DC

## 2017-11-28 MED ORDER — HYDROCHLOROTHIAZIDE 25 MG PO TABS: 25.0000 mg | ORAL_TABLET | Freq: Every day | ORAL | 3 refills | Status: DC

## 2017-11-28 MED ORDER — ATORVASTATIN CALCIUM 40 MG PO TABS: 40.0000 mg | ORAL_TABLET | Freq: Every day | ORAL | 3 refills | Status: DC

## 2017-11-28 MED ORDER — AMLODIPINE BESYLATE 5 MG PO TABS: 5.0000 mg | ORAL_TABLET | Freq: Every day | ORAL | 3 refills | Status: DC

## 2017-11-28 MED ORDER — METOPROLOL SUCCINATE ER 100 MG PO TB24: ORAL_TABLET | ORAL | 3 refills | Status: DC

## 2017-11-28 NOTE — Patient Instructions (Signed)
Compression socks  Drink more water and eat fiber for constipation  Follow up in 6 months for check up and labs  If hand tingling gets worse, please let me know   Preventive Care 40-64 Years, Female Preventive care refers to lifestyle choices and visits with your health care provider that can promote health and wellness. What does preventive care include?  A yearly physical exam. This is also called an annual well check.  Dental exams once or twice a year.  Routine eye exams. Ask your health care provider how often you should have your eyes checked.  Personal lifestyle choices, including: ? Daily care of your teeth and gums. ? Regular physical activity. ? Eating a healthy diet. ? Avoiding tobacco and drug use. ? Limiting alcohol use. ? Practicing safe sex. ? Taking low-dose aspirin daily starting at age 33. ? Taking vitamin and mineral supplements as recommended by your health care provider. What happens during an annual well check? The services and screenings done by your health care provider during your annual well check will depend on your age, overall health, lifestyle risk factors, and family history of disease. Counseling Your health care provider may ask you questions about your:  Alcohol use.  Tobacco use.  Drug use.  Emotional well-being.  Home and relationship well-being.  Sexual activity.  Eating habits.  Work and work Statistician.  Method of birth control.  Menstrual cycle.  Pregnancy history.  Screening You may have the following tests or measurements:  Height, weight, and BMI.  Blood pressure.  Lipid and cholesterol levels. These may be checked every 5 years, or more frequently if you are over 46 years old.  Skin check.  Lung cancer screening. You may have this screening every year starting at age 80 if you have a 30-pack-year history of smoking and currently smoke or have quit within the past 15 years.  Fecal occult blood test (FOBT)  of the stool. You may have this test every year starting at age 57.  Flexible sigmoidoscopy or colonoscopy. You may have a sigmoidoscopy every 5 years or a colonoscopy every 10 years starting at age 85.  Hepatitis C blood test.  Hepatitis B blood test.  Sexually transmitted disease (STD) testing.  Diabetes screening. This is done by checking your blood sugar (glucose) after you have not eaten for a while (fasting). You may have this done every 1-3 years.  Mammogram. This may be done every 1-2 years. Talk to your health care provider about when you should start having regular mammograms. This may depend on whether you have a family history of breast cancer.  BRCA-related cancer screening. This may be done if you have a family history of breast, ovarian, tubal, or peritoneal cancers.  Pelvic exam and Pap test. This may be done every 3 years starting at age 40. Starting at age 55, this may be done every 5 years if you have a Pap test in combination with an HPV test.  Bone density scan. This is done to screen for osteoporosis. You may have this scan if you are at high risk for osteoporosis.  Discuss your test results, treatment options, and if necessary, the need for more tests with your health care provider. Vaccines Your health care provider may recommend certain vaccines, such as:  Influenza vaccine. This is recommended every year.  Tetanus, diphtheria, and acellular pertussis (Tdap, Td) vaccine. You may need a Td booster every 10 years.  Varicella vaccine. You may need this if you have  not been vaccinated.  Zoster vaccine. You may need this after age 37.  Measles, mumps, and rubella (MMR) vaccine. You may need at least one dose of MMR if you were born in 1957 or later. You may also need a second dose.  Pneumococcal 13-valent conjugate (PCV13) vaccine. You may need this if you have certain conditions and were not previously vaccinated.  Pneumococcal polysaccharide (PPSV23) vaccine.  You may need one or two doses if you smoke cigarettes or if you have certain conditions.  Meningococcal vaccine. You may need this if you have certain conditions.  Hepatitis A vaccine. You may need this if you have certain conditions or if you travel or work in places where you may be exposed to hepatitis A.  Hepatitis B vaccine. You may need this if you have certain conditions or if you travel or work in places where you may be exposed to hepatitis B.  Haemophilus influenzae type b (Hib) vaccine. You may need this if you have certain conditions.  Talk to your health care provider about which screenings and vaccines you need and how often you need them. This information is not intended to replace advice given to you by your health care provider. Make sure you discuss any questions you have with your health care provider. Document Released: 04/02/2015 Document Revised: 11/24/2015 Document Reviewed: 01/05/2015 Elsevier Interactive Patient Education  [employer].

## 2017-11-28 NOTE — Progress Notes (Signed)
Subjective:    Patient ID: Olivia Kirk, female    DOB: 1955/12/02, 62 y.o.   MRN: [id number]  HPI This is a 62 yo female who presents today for CPE. Has been doing ok.   Last CPE-unknown Mammo- 10/15/17 Pap- many years, will have today Colonoscopy- 06/03/12 Tdap- 05/13/12 Flu- annual Eye- within last 1-2 years Dental- full dentures Exercise- not regular  Bilateral hand tingling - for a couple of weeks. Lasts for a few minutes a couple of times a day. Relieved with position change. Does not bother her at night. Not cold. No neck of shoulder pain. No known triggers.    Review of Systems  Constitutional: Negative.   HENT: Negative.   Eyes: Negative.   Respiratory: Negative.   Cardiovascular: Negative.   Gastrointestinal: Negative.   Endocrine: Negative.   Genitourinary: Negative.   Musculoskeletal: Negative.   Skin: Negative.   Allergic/Immunologic: Negative.   Neurological:       Bilateral hand tingling.   Hematological: Negative.   Psychiatric/Behavioral: Positive for sleep disturbance (Difficulty with early awakening).       Objective:   Physical Exam Physical Exam  Constitutional: She is oriented to person, place, and time. She appears well-developed and well-nourished. No distress.  HENT:  Head: Normocephalic and atraumatic.  Right Ear: External ear normal.  Left Ear: External ear normal.  Nose: Nose normal.  Mouth/Throat: Oropharynx is clear and moist. No oropharyngeal exudate.  Eyes: Conjunctivae are normal. Pupils are equal, round, and reactive to light.  Neck: Normal range of motion. Neck supple. No JVD present. No thyromegaly present.  Cardiovascular: Normal rate, regular rhythm, normal heart sounds and intact distal pulses.   Pulmonary/Chest: Effort normal and breath sounds normal. Right breast exhibits no inverted nipple, no mass, no nipple discharge, no skin change and no tenderness. Left breast exhibits no inverted nipple, no mass, no nipple  discharge, no skin change and no tenderness. Breasts are symmetrical.  Abdominal: Soft. Bowel sounds are normal. She exhibits no distension and no mass. There is no tenderness. There is no rebound and no guarding.  Genitourinary: Vagina normal. Pelvic exam was performed with patient supine. There is no rash, tenderness, lesion or injury on the right labia. There is no rash, tenderness, lesion or injury on the left labia. Cervix exhibits no motion tenderness and no discharge. No vaginal discharge found.  Musculoskeletal: Normal range of motion. She exhibits no edema or tenderness. No spinal tenderness. UE/LE strength 5/5.  Lymphadenopathy:    She has no cervical adenopathy.  Neurological: She is alert and oriented to person, place, and time. She has normal reflexes.  Skin: Skin is warm and dry. She is not diaphoretic.  Psychiatric: She has a normal mood and affect. Her behavior is normal. Judgment and thought content normal.  Vitals reviewed.        BP (!) 148/78 (BP Location: Right Arm, Patient Position: Sitting, Cuff Size: Normal)   Pulse 60   Temp 97.9 F (36.6 C) (Oral)   Ht 5\' 11"  (1.803 m)   Wt 236 lb (107 kg)   SpO2 98%   BMI 32.92 kg/m  Wt Readings from Last 3 Encounters:  11/28/17 236 lb (107 kg)  10/01/17 237 lb 6.4 oz (107.7 kg)  08/27/17 238 lb 8 oz (108.2 kg)   BP Readings from Last 3 Encounters:  11/28/17 (!) 148/78  10/01/17 (!) 141/78  08/27/17 140/90   Depression screen PHQ 2/9 11/28/2017 11/28/2017 05/13/2012  Decreased Interest 0 0  0  Down, Depressed, Hopeless 1 1 0  PHQ - 2 Score 1 1 0  Altered sleeping 1 - -  Tired, decreased energy 1 - -  Change in appetite 0 - -  Feeling bad or failure about yourself  0 - -  Trouble concentrating 0 - -  Moving slowly or fidgety/restless 0 - -  Suicidal thoughts 0 - -  PHQ-9 Score 3 - -     Assessment & Plan:  1. Annual physical exam - Discussed and encouraged healthy lifestyle choices- adequate sleep, regular  exercise, stress management and healthy food choices.    2. Screening for cervical cancer - Cytology - PAP  3. Need for influenza vaccination - Flu Vaccine QUAD 36+ mos IM  4. Hyperlipidemia, unspecified hyperlipidemia type - dicussed labs and elevated lipids, will restart her atorvastatin - atorvastatin (LIPITOR) 40 MG tablet; Take 1 tablet (40 mg total) by mouth daily.  Dispense: 90 tablet; Refill: 3  5. Invasive ductal carcinoma of breast, left (HCC) - UTD on mammogram - continued follow up with Dr. Grayland Ormond (scheduled for next month) - letrozole ([place]) 2.5 MG tablet; Take 1 tablet (2.5 mg total) by mouth daily.  Dispense: 90 tablet; Refill: 3  6. Paresthesia of both hands - RTC precautions reviewed, discussed prn OTC wrist braces   Clarene Reamer, FNP-BC  [facility]  12/02/2017 10:23 AM

## 2017-11-30 LAB — CYTOLOGY - PAP
Diagnosis: NEGATIVE
HPV: NOT DETECTED

## 2017-12-02 ENCOUNTER — Encounter: Payer: Self-pay | Admitting: Family Medicine

## 2017-12-30 NOTE — Progress Notes (Signed)
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  INTERVAL HISTORY: Patient returns to clinic today for routine six-month evaluation.  She continues to tolerate letrozole well without significant side effects.  She currently feels well and is asymptomatic. She has no neurologic complaints. She denies any recent fevers or illnesses. She has a good appetite and denies weight loss. She has no chest pain or shortness of breath. She denies any nausea, vomiting, constipation, or diarrhea. She has no urinary complaints.  Patient feels at her baseline offers no specific complaints today.  REVIEW OF SYSTEMS:   Review of Systems  Constitutional: Negative.  Negative for fever, malaise/fatigue and weight loss.  Respiratory: Negative.  Negative for cough and shortness of breath.   Cardiovascular: Negative.  Negative for chest pain and leg swelling.  Gastrointestinal: Negative.  Negative for abdominal pain and constipation.  Genitourinary: Negative.  Negative for dysuria.  Musculoskeletal: Negative.  Negative for back pain.  Skin: Negative.  Negative for rash.  Neurological: Negative.  Negative for sensory change, focal weakness, weakness and headaches.  Psychiatric/Behavioral: Negative.  The patient is not nervous/anxious.     As per HPI. Otherwise, a complete review of systems is negative.  PAST MEDICAL HISTORY: Past Medical History:  Diagnosis Date  . Anemia    H/O  . Arthritis    FINGER MIDDLE FINGER RIGHT HAND, LEFT KNEE  . Breast cancer ([place]) 06/06/2016   LEFT: T1b (6 mm), N0, extensive (18 mm DCIS), ER 90%, PR 90%,  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  Marland Kitchen GERD (gastroesophageal reflux disease)    RARE  . Hyperlipidemia   . Hypertension   . Personal history of radiation therapy 2018   left breast cancer    PAST SURGICAL HISTORY: Past Surgical History:  Procedure Laterality Date  . BREAST BIOPSY Left 06/06/2016   left stereo. invasive mammary carcinoma  . BREAST LUMPECTOMY Left 07/03/2016   invasive mammary carcinoma  . BREAST LUMPECTOMY WITH NEEDLE LOCALIZATION Left 07/03/2016   Procedure: BREAST LUMPECTOMY WITH NEEDLE LOCALIZATION;  Surgeon: Robert Bellow, MD;  Location: [facility] ORS;  Service: General;  Laterality: Left;  . BREAST LUMPECTOMY WITH SENTINEL LYMPH NODE BIOPSY Left 07/03/2016   Procedure: BREAST LUMPECTOMY WITH SENTINEL LYMPH NODE BX;  Surgeon: Robert Bellow, MD;  Location: [facility] ORS;  Service: General;  Laterality: Left;  . hemrrhoid    . TUBAL LIGATION      FAMILY HISTORY: Family History  Problem Relation Age of Onset  . Hypertension Mother   . Cancer Father   . Heart disease Father   . Hypertension Maternal Grandmother   . Stroke Maternal Grandmother   . Breast cancer Paternal Aunt        52    ADVANCED DIRECTIVES (Y/N):  N  HEALTH MAINTENANCE: Social History   Tobacco Use  . Smoking status: Former Smoker    Packs/day: 0.25    Years: 6.00    Pack years: 1.50    Types: Cigarettes    Last attempt to quit: 05/07/1987    Years since quitting: 30.6  . Smokeless tobacco: Never Used  Substance Use Topics  . Alcohol use: Yes    Alcohol/week: 0.0  standard drinks    Comment: Occassionally  . Drug use: No     Colonoscopy:  PAP:  Bone density:  Lipid panel:  Allergies  Allergen Reactions  . Guaifenesin & Derivatives Itching, Dermatitis and Rash    Current Outpatient Medications  Medication Sig Dispense Refill  . amLODipine (NORVASC) 5 MG tablet Take 1 tablet (5 mg total) by mouth daily. 90 tablet 3  . atorvastatin (LIPITOR) 40 MG tablet Take 1 tablet (40 mg  total) by mouth daily. 90 tablet 3  . Cholecalciferol (VITAMIN D3) 1000 units CAPS Take by mouth.    . docusate sodium (STOOL SOFTENER) 100 MG capsule Take 100 mg by mouth daily as needed for mild constipation.    . hydrochlorothiazide (HYDRODIURIL) 25 MG tablet Take 1 tablet (25 mg total) by mouth daily. 90 tablet 3  . letrozole (FEMARA) 2.5 MG tablet Take 1 tablet (2.5 mg total) by mouth daily. 90 tablet 3  . metoprolol succinate (TOPROL-XL) 100 MG 24 hr tablet TAKE 1 TABLET BY MOUTH ONCE DAILY WITH OR IMMEDIATELY FOLLOWING A MEAL 90 tablet 3  . Multiple Vitamins-Minerals (HAIR SKIN AND NAILS FORMULA) TABS Take by mouth.    . sertraline (ZOLOFT) 50 MG tablet Take 1/2 tablet by mouth every night for 7 nights then take 1 tablet nightly. 30 tablet 3  . vitamin B-12 (CYANOCOBALAMIN) 1000 MCG tablet Take 1,000 mcg by mouth daily.     No current facility-administered medications for this visit.     OBJECTIVE: Vitals:   01/03/18 1455 01/03/18 1459  BP:  134/87  Pulse:  (!) 59  Resp: 16   Temp:  98.1 F (36.7 C)     Body mass index is 32.64 kg/m.    ECOG FS:0 - Asymptomatic  General: Well-developed, well-nourished, no acute distress. Eyes: Pink conjunctiva, anicteric sclera. HEENT: Normocephalic, moist mucous membranes. Breast: Patient requested exam be deferred today. Lungs: Clear to auscultation bilaterally. Heart: Regular rate and rhythm. No rubs, murmurs, or gallops. Abdomen: Soft, nontender, nondistended. No organomegaly noted, normoactive bowel sounds. Musculoskeletal: No edema, cyanosis, or clubbing. Neuro: Alert, answering all questions appropriately. Cranial nerves grossly intact. Skin: No rashes or petechiae noted. Psych: Normal affect.  LAB RESULTS:  Lab Results  Component Value Date   NA 140 08/29/2017   K 3.5 08/29/2017   CL 103 08/29/2017   CO2 29 08/29/2017   GLUCOSE 94 08/29/2017   BUN 15 08/29/2017   CREATININE 1.03 08/29/2017   CALCIUM 10.2 08/29/2017    PROT 7.0 08/29/2017   ALBUMIN 4.2 08/29/2017   AST 17 08/29/2017   ALT 12 08/29/2017   ALKPHOS 52 08/29/2017   BILITOT 0.3 08/29/2017   GFRNONAA >60 11/10/2016   GFRAA >60 11/10/2016    Lab Results  Component Value Date   WBC 7.2 08/29/2017   NEUTROABS 3.9 08/29/2017   HGB 12.0 08/29/2017   HCT 36.1 08/29/2017   MCV 80.8 08/29/2017   PLT 279.0 08/29/2017     STUDIES: No results found.  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  Low risk MammaPrint.  PLAN:   1. 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  Patient underwent MammoSite radiation in June 2018.  Because patient had low risk MammaPrint, adjuvant chemotherapy was not necessary.  She was lost to follow-up briefly for insurance purposes and was not taking  her letrozole.  This was reinitiated and she will complete 5 years of treatment in June 2024.  Her most recent mammogram on October 15, 2017 was reported as BI-RADS 2.  Repeat in July 2020.  Return to clinic in 6 months for routine evaluation.   2.  Osteopenia: Patient had a baseline bone mineral density on Aug 09, 2016 that reported T score of -1.1.  Continue calcium and vitamin D supplementation.  Repeat in May 2020.     Patient expressed understanding and was in agreement with this plan. She also understands that She can call clinic at any time with any questions, concerns, or complaints.   Cancer Staging Malignant neoplasm of upper-inner quadrant of left breast in female, estrogen receptor positive ([place]) Staging form: Breast, AJCC 8th Edition - Clinical stage from 06/13/2016: Stage IB (cT2, cN0, cM0, G2, ER: Positive, PR: Positive, HER2: Negative) - Signed by Lloyd Huger, MD on 06/13/2016 - Pathologic stage from 08/04/2016: Stage IA (pT1b, pN0, cM0, G1, ER:  Positive, PR: Positive, HER2: Negative) - Signed by Lloyd Huger, MD on 08/04/2016   Lloyd Huger, MD   01/05/2018 8:28 AM

## 2018-01-03 ENCOUNTER — Inpatient Hospital Stay: Payer: BLUE CROSS/BLUE SHIELD | Attending: Oncology | Admitting: Oncology

## 2018-01-03 ENCOUNTER — Encounter: Payer: Self-pay | Admitting: Oncology

## 2018-01-03 VITALS — BP 134/87 | HR 59 | Temp 98.1°F | Resp 16 | Ht 71.0 in | Wt 234.0 lb

## 2018-01-03 DIAGNOSIS — C50212 Malignant neoplasm of upper-inner quadrant of left female breast: Secondary | ICD-10-CM | POA: Diagnosis not present

## 2018-01-03 DIAGNOSIS — Z79811 Long term (current) use of aromatase inhibitors: Secondary | ICD-10-CM | POA: Diagnosis not present

## 2018-01-03 DIAGNOSIS — Z170 Estrogen receptor positive status [ER+]: Secondary | ICD-10-CM | POA: Diagnosis not present

## 2018-01-03 DIAGNOSIS — Z809 Family history of malignant neoplasm, unspecified: Secondary | ICD-10-CM | POA: Insufficient documentation

## 2018-01-03 DIAGNOSIS — Z923 Personal history of irradiation: Secondary | ICD-10-CM | POA: Diagnosis not present

## 2018-01-03 DIAGNOSIS — M8580 Other specified disorders of bone density and structure, unspecified site: Secondary | ICD-10-CM | POA: Insufficient documentation

## 2018-01-03 DIAGNOSIS — Z87891 Personal history of nicotine dependence: Secondary | ICD-10-CM | POA: Diagnosis not present

## 2018-01-03 DIAGNOSIS — I10 Essential (primary) hypertension: Secondary | ICD-10-CM | POA: Insufficient documentation

## 2018-01-03 DIAGNOSIS — Z803 Family history of malignant neoplasm of breast: Secondary | ICD-10-CM | POA: Diagnosis not present

## 2018-01-03 NOTE — Progress Notes (Signed)
Patient here for follow up no changes since her last appointment. She had a breast exam a few months ago.

## 2018-05-19 ENCOUNTER — Other Ambulatory Visit: Payer: Self-pay | Admitting: Family Medicine

## 2018-05-19 DIAGNOSIS — R7303 Prediabetes: Secondary | ICD-10-CM

## 2018-05-19 DIAGNOSIS — E782 Mixed hyperlipidemia: Secondary | ICD-10-CM

## 2018-05-19 DIAGNOSIS — I10 Essential (primary) hypertension: Secondary | ICD-10-CM

## 2018-05-24 ENCOUNTER — Other Ambulatory Visit (INDEPENDENT_AMBULATORY_CARE_PROVIDER_SITE_OTHER): Payer: BLUE CROSS/BLUE SHIELD

## 2018-05-24 DIAGNOSIS — R7303 Prediabetes: Secondary | ICD-10-CM

## 2018-05-24 DIAGNOSIS — E782 Mixed hyperlipidemia: Secondary | ICD-10-CM | POA: Diagnosis not present

## 2018-05-24 DIAGNOSIS — I10 Essential (primary) hypertension: Secondary | ICD-10-CM

## 2018-05-24 NOTE — Addendum Note (Signed)
Addended by: Pilar Grammes on: 05/24/2018 03:05 PM   Modules accepted: Orders

## 2018-05-25 LAB — BASIC METABOLIC PANEL
BUN/Creatinine Ratio: 15 (calc) (ref 6–22)
BUN: 15 mg/dL (ref 7–25)
CO2: 27 mmol/L (ref 20–32)
Calcium: 10.3 mg/dL (ref 8.6–10.4)
Chloride: 103 mmol/L (ref 98–110)
Creat: 1 mg/dL — ABNORMAL HIGH (ref 0.50–0.99)
Glucose, Bld: 156 mg/dL — ABNORMAL HIGH (ref 65–99)
Potassium: 3.7 mmol/L (ref 3.5–5.3)
Sodium: 141 mmol/L (ref 135–146)

## 2018-05-25 LAB — LIPID PANEL
CHOL/HDL RATIO: 4.3 (calc) (ref <5.0)
CHOLESTEROL: 169 mg/dL (ref <200)
HDL: 39 mg/dL — AB (ref >=50)
LDL CHOLESTEROL (CALC): 96 mg/dL
Non-HDL Cholesterol (Calc): 130 mg/dL (calc) — ABNORMAL HIGH (ref <130)
Triglycerides: 215 mg/dL — ABNORMAL HIGH (ref <150)

## 2018-05-25 LAB — HEMOGLOBIN A1C
HEMOGLOBIN A1C: 6.6 %{Hb} — AB (ref <5.7)
MEAN PLASMA GLUCOSE: 143 (calc)
eAG (mmol/L): 7.9 (calc)

## 2018-05-29 ENCOUNTER — Ambulatory Visit: Payer: BLUE CROSS/BLUE SHIELD | Admitting: Family Medicine

## 2018-06-05 ENCOUNTER — Ambulatory Visit: Payer: BLUE CROSS/BLUE SHIELD | Admitting: Family Medicine

## 2018-06-30 NOTE — Progress Notes (Signed)
[place]  Telephone:(336) [phone] Fax:(336) [phone]  ID: Olivia Kirk OB: 10-24-1955  MR#: [id number]  UXN#:[id number]  Patient Care Team: Elby Beck, FNP as PCP - General (Nurse Practitioner) Arnetha Courser, MD as Attending Physician (Family Medicine)  I connected with Olivia Kirk on 07/02/18 at  1:00 PM EDT by video enabled telemedicine visit and verified that I am speaking with the correct person using two identifiers.   I discussed the limitations, risks, security and privacy concerns of performing an evaluation and management service by telemedicine and the availability of in-person appointments. I also discussed with the patient that there may be a patient responsible charge related to this service. The patient expressed understanding and agreed to proceed.   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  INTERVAL HISTORY: Patient agreed to use FaceTime for evaluation for her routine 54-monthevaluation.  She currently feels well and is asymptomatic.  She is tolerating letrozole without significant side effects. She has no neurologic complaints. She denies any recent fevers or illnesses. She has a good appetite and denies weight loss.  She denies any chest pain, shortness of breath, cough, or hemoptysis.  She denies any nausea, vomiting, constipation, or diarrhea. She has no urinary complaints.  Patient feels at her baseline offers no specific complaints today.    REVIEW OF SYSTEMS:   Review of Systems  Constitutional: Negative.  Negative for fever, malaise/fatigue and weight loss.  Respiratory: Negative.  Negative for cough and shortness of breath.   Cardiovascular: Negative.  Negative for chest pain and leg  swelling.  Gastrointestinal: Negative.  Negative for abdominal pain and constipation.  Genitourinary: Negative.  Negative for dysuria.  Musculoskeletal: Negative.  Negative for back pain.  Skin: Negative.  Negative for rash.  Neurological: Negative.  Negative for sensory change, focal weakness, weakness and headaches.  Psychiatric/Behavioral: Negative.  The patient is not nervous/anxious.     As per HPI. Otherwise, a complete review of systems is negative.  PAST MEDICAL HISTORY: Past Medical History:  Diagnosis Date  . Anemia    H/O  . Arthritis    FINGER MIDDLE FINGER RIGHT HAND, LEFT KNEE  . [place]   LEFT: T1b (6 mm), N0, 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  .Marland KitchenGERD (gastroesophageal reflux disease)    RARE  . Hyperlipidemia   . Hypertension   . Personal history of radiation therapy 2018   left breast cancer    PAST SURGICAL HISTORY: Past Surgical History:  Procedure Laterality Date  . BREAST BIOPSY Left 06/06/2016   left stereo. invasive mammary carcinoma  . BREAST LUMPECTOMY Left 07/03/2016   invasive mammary carcinoma  . BREAST LUMPECTOMY WITH NEEDLE LOCALIZATION Left 07/03/2016   Procedure: BREAST LUMPECTOMY WITH NEEDLE LOCALIZATION;  Surgeon: JRobert Bellow MD;  Location: [facility];  Service: General;  Laterality: Left;  . BREAST LUMPECTOMY WITH SENTINEL LYMPH NODE BIOPSY Left 07/03/2016   Procedure: BREAST LUMPECTOMY WITH SENTINEL LYMPH NODE BX;  Surgeon: JRobert Bellow MD;  Location: [facility] OR[facility];  Service: General;  Laterality: Left;  . hemrrhoid    . TUBAL LIGATION      FAMILY HISTORY: Family History  Problem Relation Age of Onset  . Hypertension Mother   .  Cancer Father   . Heart disease Father   . Hypertension Maternal Grandmother   . Stroke Maternal Grandmother   . Breast cancer Paternal Aunt        21    ADVANCED DIRECTIVES (Y/N):  N  HEALTH MAINTENANCE: Social History   Tobacco Use  .  Smoking status: Former Smoker    Packs/day: 0.25    Years: 6.00    Pack years: 1.50    Types: Cigarettes    Last attempt to quit: 05/07/1987    Years since quitting: 31.1  . Smokeless tobacco: Never Used  Substance Use Topics  . Alcohol use: Yes    Alcohol/week: 0.0 standard drinks    Comment: Occassionally  . Drug use: No     Colonoscopy:  PAP:  Bone density:  Lipid panel:  Allergies  Allergen Reactions  . Guaifenesin & Derivatives Itching, Dermatitis and Rash    Current Outpatient Medications  Medication Sig Dispense Refill  . amLODipine (NORVASC) 5 MG tablet Take 1 tablet (5 mg total) by mouth daily. 90 tablet 3  . atorvastatin (LIPITOR) 40 MG tablet Take 1 tablet (40 mg total) by mouth daily. 90 tablet 3  . Cholecalciferol (VITAMIN D3) 1000 units CAPS Take by mouth.    . docusate sodium (STOOL SOFTENER) 100 MG capsule Take 100 mg by mouth daily as needed for mild constipation.    . hydrochlorothiazide (HYDRODIURIL) 25 MG tablet Take 1 tablet (25 mg total) by mouth daily. 90 tablet 3  . letrozole (FEMARA) 2.5 MG tablet Take 1 tablet (2.5 mg total) by mouth daily. 90 tablet 3  . metoprolol succinate (TOPROL-XL) 100 MG 24 hr tablet TAKE 1 TABLET BY MOUTH ONCE DAILY WITH OR IMMEDIATELY FOLLOWING A MEAL 90 tablet 3  . Multiple Vitamins-Minerals (HAIR SKIN AND NAILS FORMULA) TABS Take by mouth.    . sertraline (ZOLOFT) 50 MG tablet Take 1/2 tablet by mouth every night for 7 nights then take 1 tablet nightly. 30 tablet 3  . vitamin B-12 (CYANOCOBALAMIN) 1000 MCG tablet Take 1,000 mcg by mouth daily.     No current facility-administered medications for this visit.     OBJECTIVE: There were no vitals filed for this visit.   There is no height or weight on file to calculate BMI.    ECOG FS:0 - Asymptomatic  General: Well-developed, well-nourished, no acute distress. HEENT: Normocephalic. Neuro: Alert, answering all questions appropriately. Cranial nerves grossly intact. Skin:  No rashes or petechiae noted. Psych: Normal affect.  LAB RESULTS:  Lab Results  Component Value Date   NA 141 05/24/2018   K 3.7 05/24/2018   CL 103 05/24/2018   CO2 27 05/24/2018   GLUCOSE 156 (H) 05/24/2018   BUN 15 05/24/2018   CREATININE 1.00 (H) 05/24/2018   CALCIUM 10.3 05/24/2018   PROT 7.0 08/29/2017   ALBUMIN 4.2 08/29/2017   AST 17 08/29/2017   ALT 12 08/29/2017   ALKPHOS 52 08/29/2017   BILITOT 0.3 08/29/2017   GFRNONAA >60 11/10/2016   GFRAA >60 11/10/2016    Lab Results  Component Value Date   WBC 7.2 08/29/2017   NEUTROABS 3.9 08/29/2017   HGB 12.0 08/29/2017   HCT 36.1 08/29/2017   MCV 80.8 08/29/2017   PLT 279.0 08/29/2017     STUDIES: No results found.  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  Low risk MammaPrint.  PLAN:   1. Pathologic stage IA  ER/PR positive, HER-2 negative invasive carcinoma of the upper inner quadrant of the left breast: Final pathology results reviewed independently.  Patient underwent MammoSite radiation in June 2018.  Because patient had low risk MammaPrint, adjuvant chemotherapy was not necessary.  She was lost to follow-up briefly for insurance purposes and was not taking her letrozole.  Patient reinitiated letrozole and will complete 5 years of treatment in June 2024.  Her most recent mammogram on October 15, 2017 was reported as BI-RADS 2.  Repeat in July 2020.  Return to clinic in 6 months for routine evaluation. 2.  Osteopenia: Patient had a baseline bone mineral density on Aug 09, 2016 that reported T score of -1.1. Continue calcium and vitamin D supplementation.  Repeat in July 2020 along with her mammogram as above.  I provided 20 minutes of face-to-face video visit time during this encounter, and > 50% was spent counseling as documented under my assessment & plan.   Patient expressed understanding and was in agreement with this  plan. She also understands that She can call clinic at any time with any questions, concerns, or complaints.   Cancer Staging Malignant neoplasm of upper-inner quadrant of left breast in female, estrogen receptor positive ([place]) Staging form: Breast, AJCC 8th Edition - Clinical stage from 06/13/2016: Stage IB (cT2, cN0, cM0, G2, ER: Positive, PR: Positive, HER2: Negative) - Signed by Lloyd Huger, MD on 06/13/2016 - Pathologic stage from 08/04/2016: Stage IA (pT1b, pN0, cM0, G1, ER: Positive, PR: Positive, HER2: Negative) - Signed by Lloyd Huger, MD on 08/04/2016   Lloyd Huger, MD   07/02/2018 3:55 PM

## 2018-07-01 ENCOUNTER — Other Ambulatory Visit: Payer: Self-pay

## 2018-07-02 ENCOUNTER — Encounter: Payer: Self-pay | Admitting: Oncology

## 2018-07-02 ENCOUNTER — Inpatient Hospital Stay: Payer: BLUE CROSS/BLUE SHIELD | Attending: Oncology | Admitting: Oncology

## 2018-07-02 DIAGNOSIS — I10 Essential (primary) hypertension: Secondary | ICD-10-CM

## 2018-07-02 DIAGNOSIS — C50212 Malignant neoplasm of upper-inner quadrant of left female breast: Secondary | ICD-10-CM

## 2018-07-02 DIAGNOSIS — Z87891 Personal history of nicotine dependence: Secondary | ICD-10-CM | POA: Diagnosis not present

## 2018-07-02 DIAGNOSIS — R0602 Shortness of breath: Secondary | ICD-10-CM | POA: Insufficient documentation

## 2018-07-02 DIAGNOSIS — Z923 Personal history of irradiation: Secondary | ICD-10-CM

## 2018-07-02 DIAGNOSIS — C50912 Malignant neoplasm of unspecified site of left female breast: Secondary | ICD-10-CM

## 2018-07-02 DIAGNOSIS — Z170 Estrogen receptor positive status [ER+]: Secondary | ICD-10-CM | POA: Diagnosis not present

## 2018-07-02 DIAGNOSIS — H6981 Other specified disorders of Eustachian tube, right ear: Secondary | ICD-10-CM | POA: Insufficient documentation

## 2018-07-02 DIAGNOSIS — Z79811 Long term (current) use of aromatase inhibitors: Secondary | ICD-10-CM

## 2018-07-02 DIAGNOSIS — Z79899 Other long term (current) drug therapy: Secondary | ICD-10-CM

## 2018-07-02 DIAGNOSIS — H6123 Impacted cerumen, bilateral: Secondary | ICD-10-CM | POA: Insufficient documentation

## 2018-07-02 DIAGNOSIS — R42 Dizziness and giddiness: Secondary | ICD-10-CM | POA: Insufficient documentation

## 2018-07-02 MED ORDER — LETROZOLE 2.5 MG PO TABS: 2.5000 mg | ORAL_TABLET | Freq: Every day | ORAL | 3 refills | Status: DC

## 2018-07-02 NOTE — Progress Notes (Signed)
Patient stated that she had been doing well with her letrozole. Patient stated that she has not had any problems with her bilateral breasts. Patient does her monthly breast exam. Patient stated that she currently twisted her left knee. Patient was told to contact her PCP.

## 2018-07-29 ENCOUNTER — Ambulatory Visit (INDEPENDENT_AMBULATORY_CARE_PROVIDER_SITE_OTHER): Payer: BLUE CROSS/BLUE SHIELD | Admitting: Family Medicine

## 2018-07-29 ENCOUNTER — Encounter: Payer: Self-pay | Admitting: Family Medicine

## 2018-07-29 DIAGNOSIS — R739 Hyperglycemia, unspecified: Secondary | ICD-10-CM

## 2018-07-29 DIAGNOSIS — E785 Hyperlipidemia, unspecified: Secondary | ICD-10-CM

## 2018-07-29 DIAGNOSIS — I10 Essential (primary) hypertension: Secondary | ICD-10-CM

## 2018-07-29 DIAGNOSIS — F419 Anxiety disorder, unspecified: Secondary | ICD-10-CM | POA: Diagnosis not present

## 2018-07-29 DIAGNOSIS — E669 Obesity, unspecified: Secondary | ICD-10-CM

## 2018-07-29 DIAGNOSIS — M25562 Pain in left knee: Secondary | ICD-10-CM | POA: Diagnosis not present

## 2018-07-29 MED ORDER — ATORVASTATIN CALCIUM 80 MG PO TABS: 80.0000 mg | ORAL_TABLET | Freq: Every day | ORAL | 1 refills | Status: DC

## 2018-07-29 MED ORDER — SERTRALINE HCL 50 MG PO TABS: 50.0000 mg | ORAL_TABLET | Freq: Every day | ORAL | 1 refills | Status: DC

## 2018-07-29 NOTE — Progress Notes (Signed)
Virtual Visit via Video Note  I connected with Olivia Kirk on 07/29/18 at  2:30 PM EDT by a video enabled telemedicine application and verified that I am speaking with the correct person using two identifiers.  Location: Patient: In her home Provider: [place]   I discussed the limitations of evaluation and management by telemedicine and the availability of in person appointments. The patient expressed understanding and agreed to proceed.  History of Present Illness: This is a 63 yo female who presents for virtual visit for follow up of chronic medical conditions. She had labs drawn prior to our visit.   Hyperlipidemia- not at goal on atorvastatin 40 mg. No side effects with medication.   Elevated hgba1c- has been creeping up over last 2 years. Most recent 6.6. Patient does not want to add additional medication and would like to work on diet. Diet recall- breakfast- cheese toast or english muffin. Lunch- burger or leftovers. Dinner- meat such as corned beef, BBQ chicken, cabbage, greens and creamed potatoes. Beverages- regular Coke x 2 cans, coffee x 2 with several teaspoons sugar and cream.   HTN- taking amlodipine. No SE. Occasional LE swelling. Has wrist BP cuff at home, not sure if accurate.   Left knee pain- twisted while dancing a couple of weeks ago. Slowly getting better. Occasional sharp pain.   Insomnia- falls asleep ok, difficulty staying asleep some nights.    Past Medical History:  Diagnosis Date  . Anemia    H/O  . Arthritis    FINGER MIDDLE FINGER RIGHT HAND, LEFT KNEE  . Breast cancer ([place]) 06/06/2016   LEFT: T1b (6 mm), N0, 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  Marland Kitchen GERD (gastroesophageal reflux disease)    RARE  . Hyperlipidemia   . Hypertension   . Personal history of radiation therapy 2018   left breast cancer   Past Surgical History:  Procedure Laterality Date  . BREAST BIOPSY Left 06/06/2016   left stereo.  invasive mammary carcinoma  . BREAST LUMPECTOMY Left 07/03/2016   invasive mammary carcinoma  . BREAST LUMPECTOMY WITH NEEDLE LOCALIZATION Left 07/03/2016   Procedure: BREAST LUMPECTOMY WITH NEEDLE LOCALIZATION;  Surgeon: Robert Bellow, MD;  Location: [facility] OR[facility];  Service: General;  Laterality: Left;  . BREAST LUMPECTOMY WITH SENTINEL LYMPH NODE BIOPSY Left 07/03/2016   Procedure: BREAST LUMPECTOMY WITH SENTINEL LYMPH NODE BX;  Surgeon: Robert Bellow, MD;  Location: [facility] ORS;  Service: General;  Laterality: Left;  . hemrrhoid    . TUBAL LIGATION     Family History  Problem Relation Age of Onset  . Hypertension Mother   . Cancer Father   . Heart disease Father   . Hypertension Maternal Grandmother   . Stroke Maternal Grandmother   . Breast cancer Paternal Aunt        65   Social History   Tobacco Use  . Smoking status: Former Smoker    Packs/day: 0.25    Years: 6.00    Pack years: 1.50    Types: Cigarettes    Last attempt to quit: 05/07/1987    Years since quitting: 31.2  . Smokeless tobacco: Never Used  Substance Use Topics  . Alcohol use: Yes    Alcohol/week: 0.0 standard drinks    Comment: Occassionally  . Drug use: No     Observations/Objective: Alert and answers questions appropriately. Visible skin is unremarkable. She is normally conversive without shortness of breath. Mood and affect appropriate.  There were no vitals taken for this visit. Wt Readings from Last 3 Encounters:  01/03/18 234 lb (106.1 kg)  11/28/17 236 lb (107 kg)  10/01/17 237 lb 6.4 oz (107.7 kg)    Assessment and Plan: 1. Elevated blood sugar - patient does not want to start medication. Discussed diet and exercise and goals to avoid starting metformin.  - follow up in 6 weeks to recheck   2. Anxiety - doing well on sertraline, continue at current dose - sertraline (ZOLOFT) 50 MG tablet; Take 1 tablet (50 mg total) by mouth at bedtime.  Dispense: 90 tablet; Refill: 1  3.  Hyperlipidemia, unspecified hyperlipidemia type - will increase atorvastatin from 40 to 80 mg - atorvastatin (LIPITOR) 80 MG tablet; Take 1 tablet (80 mg total) by mouth daily.  Dispense: 90 tablet; Refill: 1  4. Acute pain of left knee - has improved since initial injury, discussed seeing Dr. Lorelei Pont (sports medicine) if no improvement or if worsening  5. Obesity (BMI 30-39.9) - diet and exercise information discussed and written info mailed to patient  6. Essential hypertension - continue amlodipine, follow up in 6 weeks   Clarene Reamer, FNP-BC  [facility], [place]  08/04/2018 8:41 AM   Follow Up Instructions: Mailed AVS to home address.    I discussed the assessment and treatment plan with the patient. The patient was provided an opportunity to ask questions and all were answered. The patient agreed with the plan and demonstrated an understanding of the instructions.   The patient was advised to call back or seek an in-person evaluation if the symptoms worsen or if the condition fails to improve as anticipated.    Elby Beck, FNP

## 2018-07-29 NOTE — Patient Instructions (Addendum)
Hi Olivia Kirk,  It was good to talk with you on Monday. I'm just going to go over your labs below and my recommendations.   Diabetes- your blood sugar is in the diabetic range at the low end. I am ok with you working on your diet and exercise to see if you can bring this down. The first place to start is with sugary beverages. There are about 10 teaspoons of sugar in a can of Coke. Please stop your Cokes over the next 2 weeks and if possible, avoid completely. If you can't completely eliminate soda, have a small amount (4 ounces) no more than twice a week.  I have also included a copy of the disc with the clinical and glycemic diet plan which has some helpful information.  Eat lean protein with every meal (eggs, lean chicken/beef/pork/fish, beans, low-fat dairy) and plenty of non-starchy vegetables.  Eat high-fiber, whole-grain starches such as brown rice, high-fiber bread, wheat pasta and sweet potatoes.  Cholesterol- your levels are still too high.  I have sent in a new prescription for a higher dose of your cholesterol medication.  You can take your current 40 mg tablets 2 at a time until they are gone then take 1 of the 80 mg tablets nightly.  Sleep- poor sleep can make it difficult for you to lose weight and have energy for exercise.  Stop drinking liquids several hours before you go to sleep at night so you do not have to get up to go to the bathroom in the middle of the night.  Also consider deep breathing exercises and mindfulness to help with relaxation.  I have included some information below  Left knee pain- I am glad you were dancing but sorry that you hurt your knee.  If it is not better with some over-the-counter Tylenol in the next week, I encourage you to make a follow-up appointment with Dr. Konrad Penta who is the sports medicine physician in the office here at [facility].   Please schedule a follow-up visit with me in about 6 weeks to recheck your blood work and your blood pressure.   If you want to bring your wrist cuff for comparison feel free.  Warm regards,  Tor Netters, FNP-BC   Mindfulness-Based Stress Reduction Mindfulness-based stress reduction (MBSR) is a program that helps people learn to practice mindfulness. Mindfulness is the practice of intentionally paying attention to the present moment. It can be learned and practiced through techniques such as education, breathing exercises, meditation, and yoga. MBSR includes several mindfulness techniques in one program. MBSR works best when you understand the treatment, are willing to try new things, and can commit to spending time practicing what you learn. MBSR training may include learning about:  How your emotions, thoughts, and reactions affect your body.  New ways to respond to things that cause negative thoughts to start (triggers).  How to notice your thoughts and let go of them.  Practicing awareness of everyday things that you normally do without thinking.  The techniques and goals of different types of meditation. What are the benefits of MBSR? MBSR can have many benefits, which include helping you to:  Develop self-awareness. This refers to knowing and understanding yourself.  Learn skills and attitudes that help you to participate in your own health care.  Learn new ways to care for yourself.  Be more accepting about how things are, and let things go.  Be less judgmental and approach things with an open mind.  Be patient with yourself and trust yourself more. MBSR has also been shown to:  Reduce negative emotions, such as depression and anxiety.  Improve memory and focus.  Change how you sense and approach pain.  Boost your body's ability to fight infections.  Help you connect better with other people.  Improve your sense of well-being. Follow these instructions at home:   Find a local in-person or online MBSR program.  Set aside some time regularly for mindfulness  practice.  Find a mindfulness practice that works best for you. This may include one or more of the following: ? Meditation. Meditation involves focusing your mind on a certain thought or activity. ? Breathing awareness exercises. These help you to stay present by focusing on your breath. ? Body scan. For this practice, you lie down and pay attention to each part of your body from head to toe. You can identify tension and soreness and intentionally relax parts of your body. ? Yoga. Yoga involves stretching and breathing, and it can improve your ability to move and be flexible. It can also provide an experience of testing your body's limits, which can help you release stress. ? Mindful eating. This way of eating involves focusing on the taste, texture, color, and smell of each bite of food. Because this slows down eating and helps you feel full sooner, it can be an important part of a weight-loss plan.  Find a podcast or recording that provides guidance for breathing awareness, body scan, or meditation exercises. You can listen to these any time when you have a free moment to rest without distractions.  Follow your treatment plan as told by your health care provider. This may include taking regular medicines and making changes to your diet or lifestyle as recommended. How to practice mindfulness To do a basic awareness exercise:  Find a comfortable place to sit.  Pay attention to the present moment. Observe your thoughts, feelings, and surroundings just as they are.  Avoid placing judgment on yourself, your feelings, or your surroundings. Make note of any judgment that comes up, and let it go.  Your mind may wander, and that is okay. Make note of when your thoughts drift, and return your attention to the present moment. To do basic mindfulness meditation:  Find a comfortable place to sit. This may include a stable chair or a firm floor cushion. ? Sit upright with your back straight. Let your  arms fall next to your side with your hands resting on your legs. ? If sitting in a chair, rest your feet flat on the floor. ? If sitting on a cushion, cross your legs in front of you.  Keep your head in a neutral position with your chin dropped slightly. Relax your jaw and rest the tip of your tongue on the roof of your mouth. Drop your gaze to the floor. You can close your eyes if you like.  Breathe normally and pay attention to your breath. Feel the air moving in and out of your nose. Feel your belly expanding and relaxing with each breath.  Your mind may wander, and that is okay. Make note of when your thoughts drift, and return your attention to your breath.  Avoid placing judgment on yourself, your feelings, or your surroundings. Make note of any judgment or feelings that come up, let them go, and bring your attention back to your breath.  When you are ready, lift your gaze or open your eyes. Pay attention to how your  body feels after the meditation. Where to find more information You can find more information about MBSR from:  Your health care provider.  Community-based meditation centers or programs.  Programs offered near you. Summary  Mindfulness-based stress reduction (MBSR) is a program that teaches you how to intentionally pay attention to the present moment. It is used with other treatments to help you cope better with daily stress, emotions, and pain.  MBSR focuses on developing self-awareness, which allows you to respond to life stress without judgment or negative emotions.  MBSR programs may involve learning different mindfulness practices, such as breathing exercises, meditation, yoga, body scan, or mindful eating. Find a mindfulness practice that works best for you, and set aside time for it on a regular basis. This information is not intended to replace advice given to you by your health care provider. Make sure you discuss any questions you have with your health care  provider. Document Released: 07/13/2016 Document Revised: 07/13/2016 Document Reviewed: 07/13/2016 Elsevier Interactive Patient Education  2019 [employer].

## 2018-08-04 ENCOUNTER — Encounter: Payer: Self-pay | Admitting: Family Medicine

## 2018-08-29 IMAGING — MG MM BREAST BX W LOC DEV 1ST LESION IMAGE BX SPEC STEREO GUIDE*L*
8 of 14 series · 8 of 18 positions shown · non-contrast
Comparison: Previous exams.

ADDENDUM:
Pathology of the left breast biopsy revealed BREAST, LEFT, UPPER
INNER QUADRANT; STEREOTACTIC CORE BIOPSY: INVASIVE MAMMARY
CARCINOMA, NO SPECIAL TYPE.

- DUCTAL CARCINOMA IN SITU, INTERMEDIATE NUCLEAR GRADE WITH
COMEDONECROSIS AND CALCIFICATIONS, POSSIBLY EXTENSIVE, SEE COMMENT.
Histologic grade of invasive carcinoma: Grade 2. Lymphovascular
invasion: Not identified. ER/PR/HER2: Immunohistochemistry will be
performed on block A4, with reflex to FISH for HER2 2+. The results
will be reported in an addendum. Comment: Multiple core fragments
contain both in situ and invasive carcinoma. The single largest
invasive focus is 3 mm, but a larger tumor is not excluded.
Correlation with imaging findings is recommended; an extensive
intraductal component may be present. The definitive grade will be
assigned on the excisional specimen.
These findings were communicated to [facility] in the [facility] on 06/07/16 at [DATE]. Read-back procedure was
performed. This was found to be concordant by Dr. Galaraund
impression and notes.
Recommendation:  Surgical and oncology referrals.
Results and recommendations were relayed to Dr. Agustus nurse [place].
After speaking with Dr. Elsefer, she asked that the radiologist contact
the patient and the nurse navigators for [REDACTED] make the referral. Results were relayed to the patient by Dr.
Dunet by phone on 06/08/16. She stated she did well following the
biopsy. All of her questions were answered. She was encouraged to
call the [HOSPITAL] with any further questions or
concerns.
Referrals were made with Dr. Smout for 06/09/16 at [DATE] and Dr.
Jumper for 06/16/16 at [DATE] by Elenochka Ganin, RN, nurse
navigator. Both the patient and Dr. [REDACTED] have been notified
of the appointment information.
Addendum by Agustus on 06/09/16.
CLINICAL DATA: Left breast upper inner quadrant calcifications seen
on most recent diagnostic mammography.
EXAM:
LEFT BREAST STEREOTACTIC CORE NEEDLE BIOPSY

[L (1 of 4)]
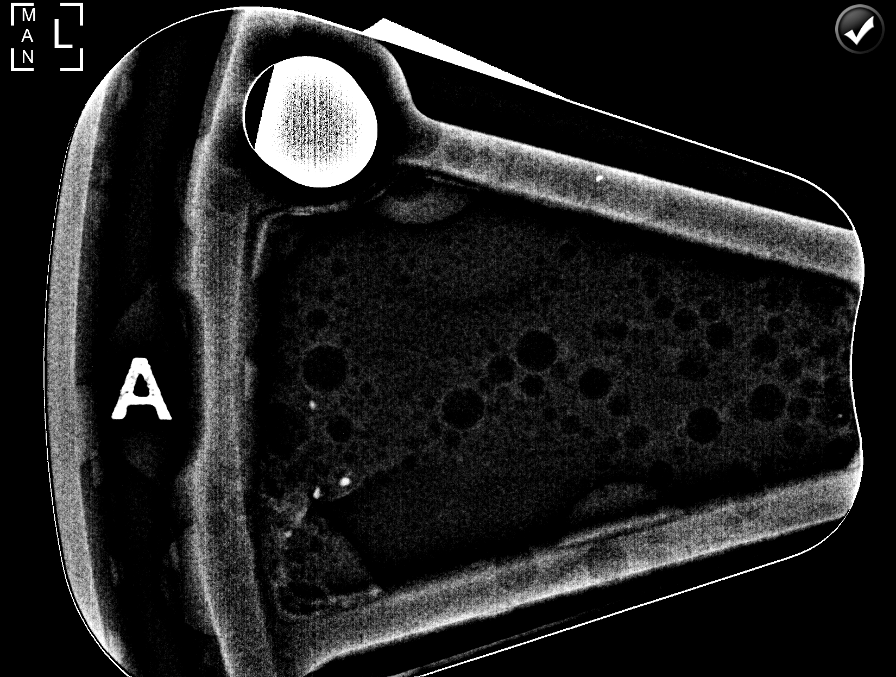

[L (2 of 4)]
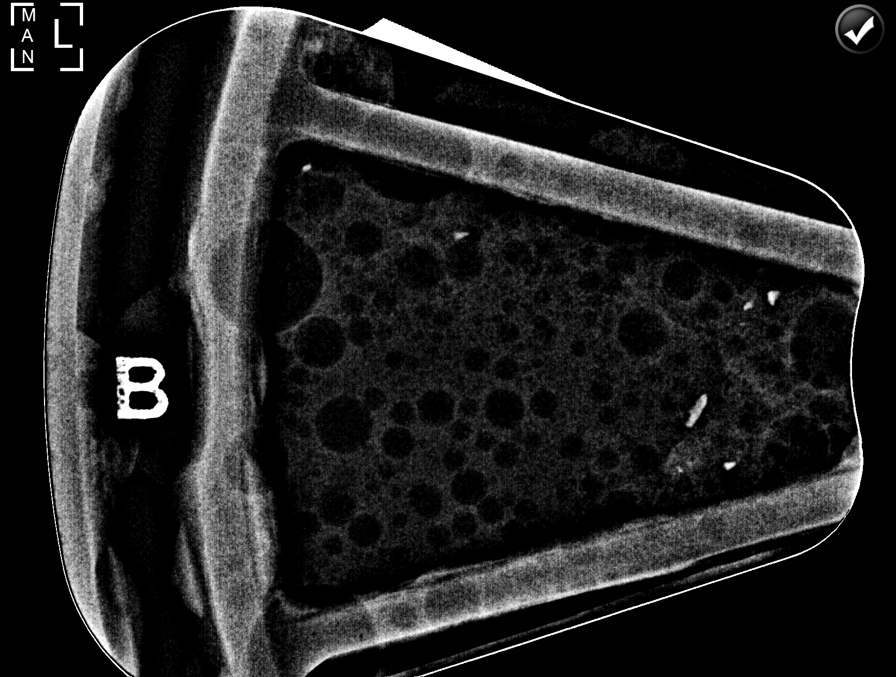

[L (3 of 4)]
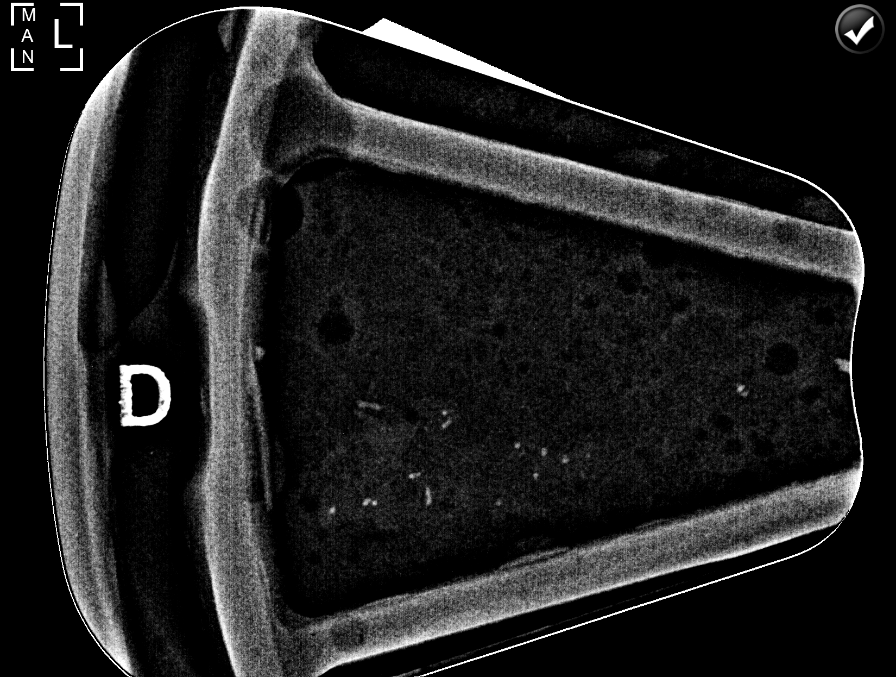

[L (4 of 4)]
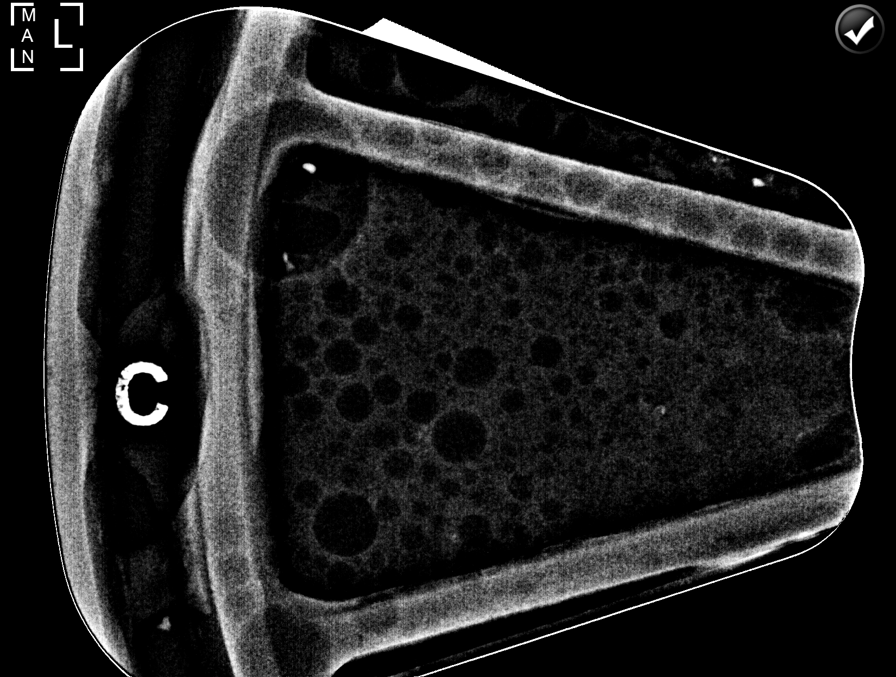

[L CC (1 of 4)]
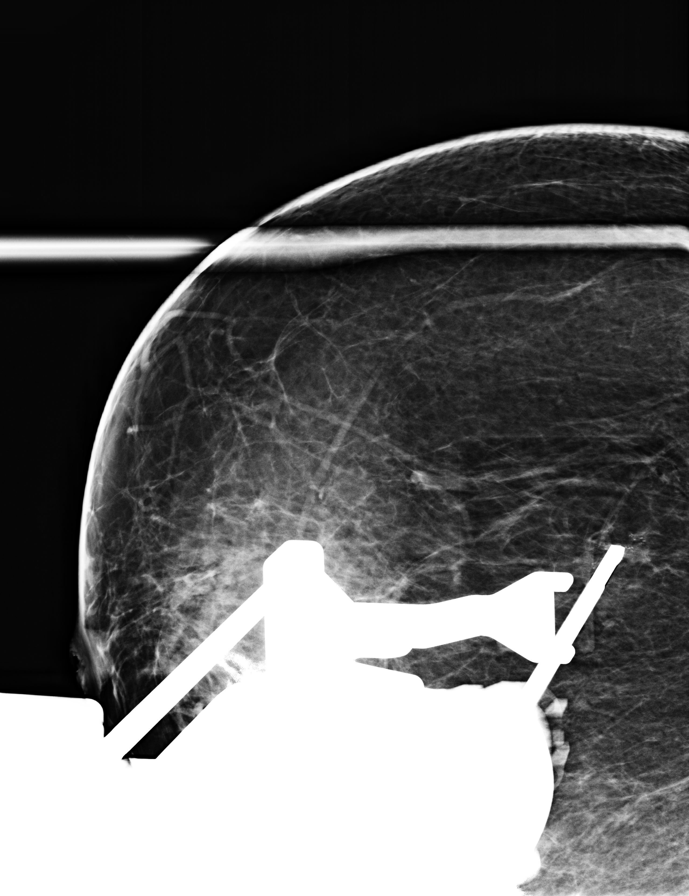

[L CC (2 of 4)]
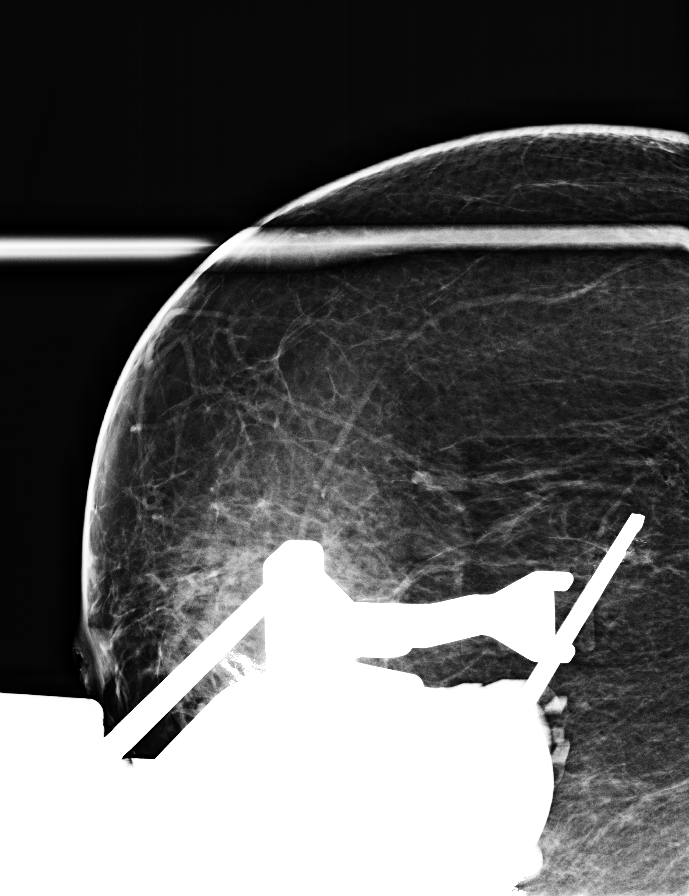

[L CC (3 of 4)]
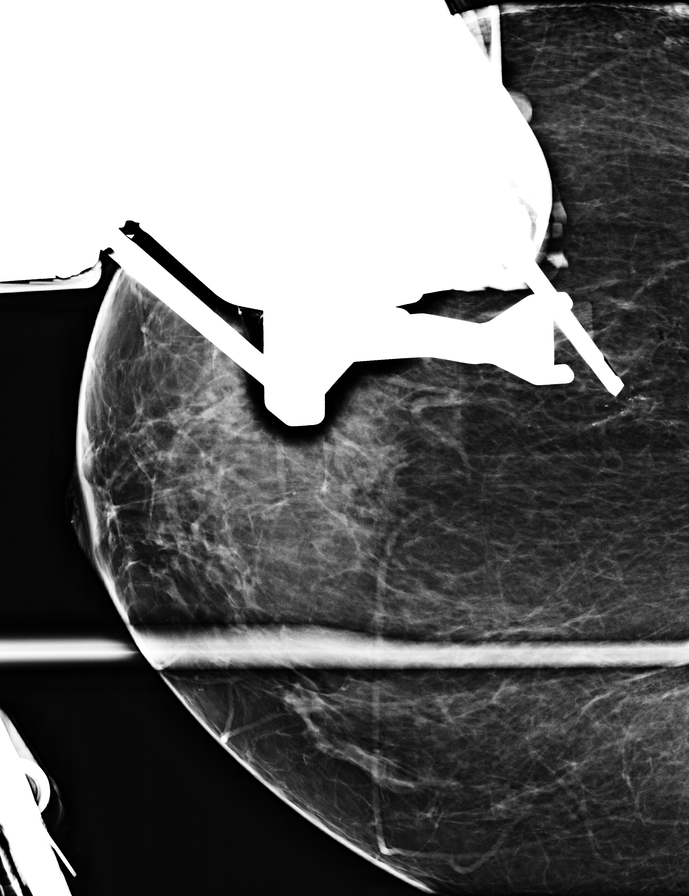

[L CC (4 of 4)]
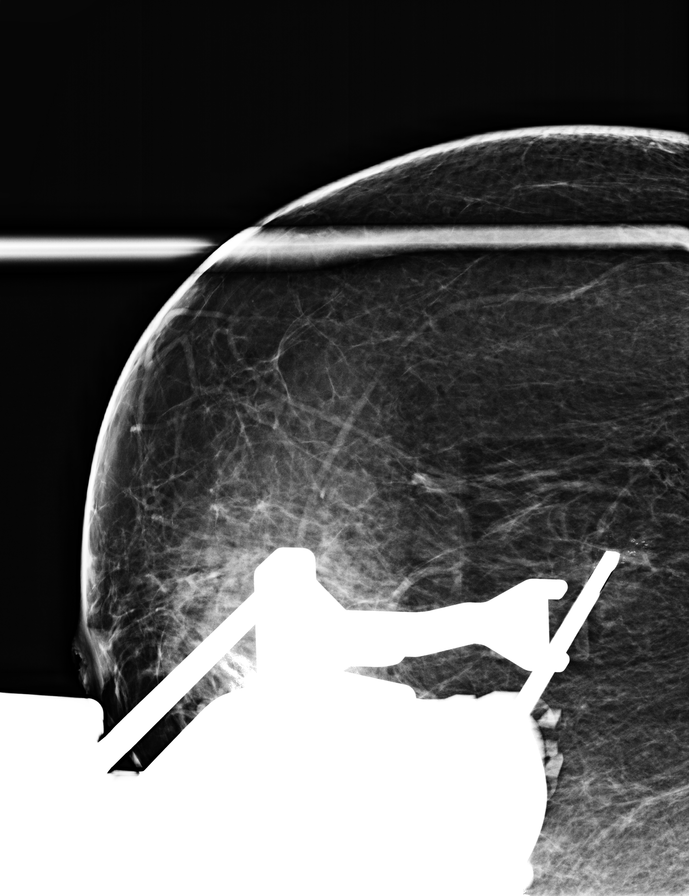

[8 of 18 positions shown; findings below may reference images not displayed]



Using sterile technique and 1% Lidocaine as local anesthetic, under
stereotactic guidance, a 9 gauge vacuum assisted device was used to
perform core needle biopsy of calcifications in the upper inner
quadrant of the left breast using a superior approach. Specimen
radiograph was performed showing presence of calcification.
Specimens with calcifications are identified for pathology.

At the conclusion of the procedure, a top hat shaped tissue marker
clip was deployed into the biopsy cavity. Follow-up 2-view mammogram
was performed and dictated separately.
IMPRESSION: Stereotactic-guided biopsy of left breast. No apparent
complications.

## 2018-09-25 IMAGING — MG MM PLC BREAST LOC DEV 1ST LESION INC MAMMO GUIDE*L*
4 series · 4 of 4 positions shown · non-contrast
Comparison: Previous exams.

CLINICAL DATA: Left breast upper inner quadrant DCIS, preoperative
needle localization.

EXAM:
NEEDLE LOCALIZATION OF THE LEFT BREAST WITH MAMMO GUIDANCE

[L CC (1 of 2)]
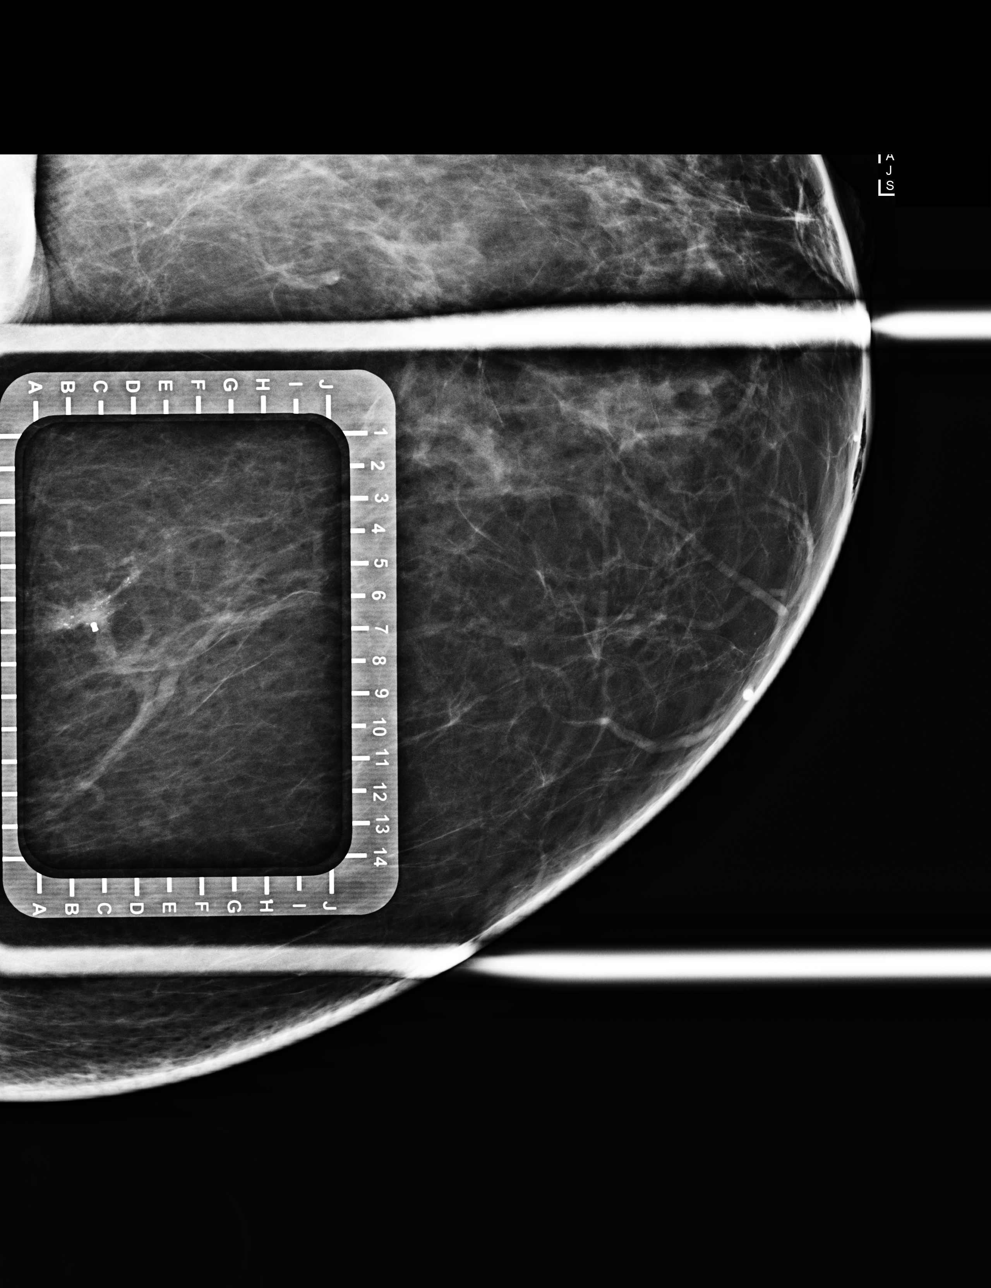

[L CC (2 of 2)]
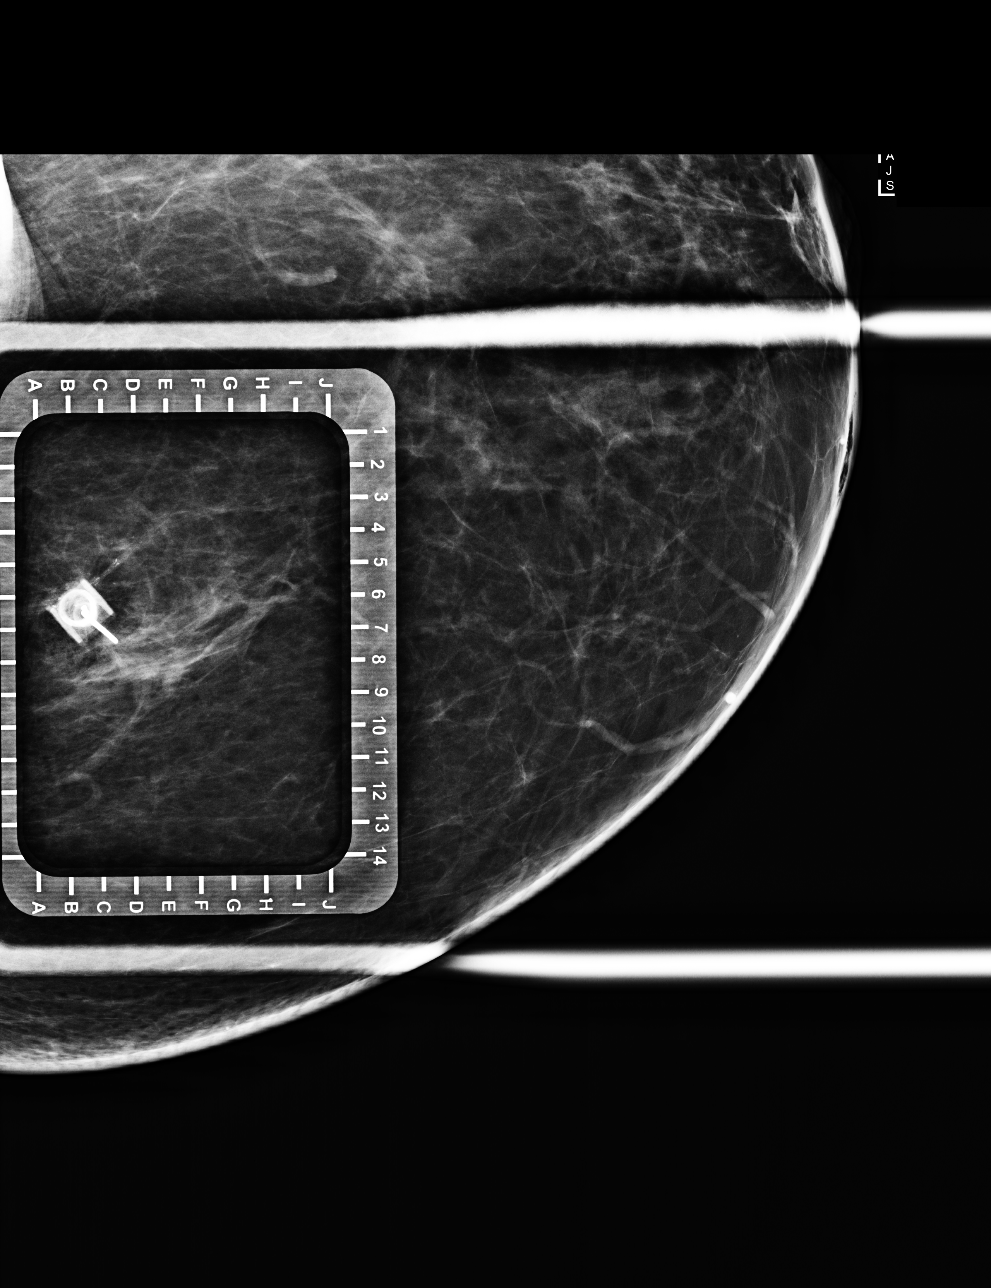

[L ML (1 of 2)]
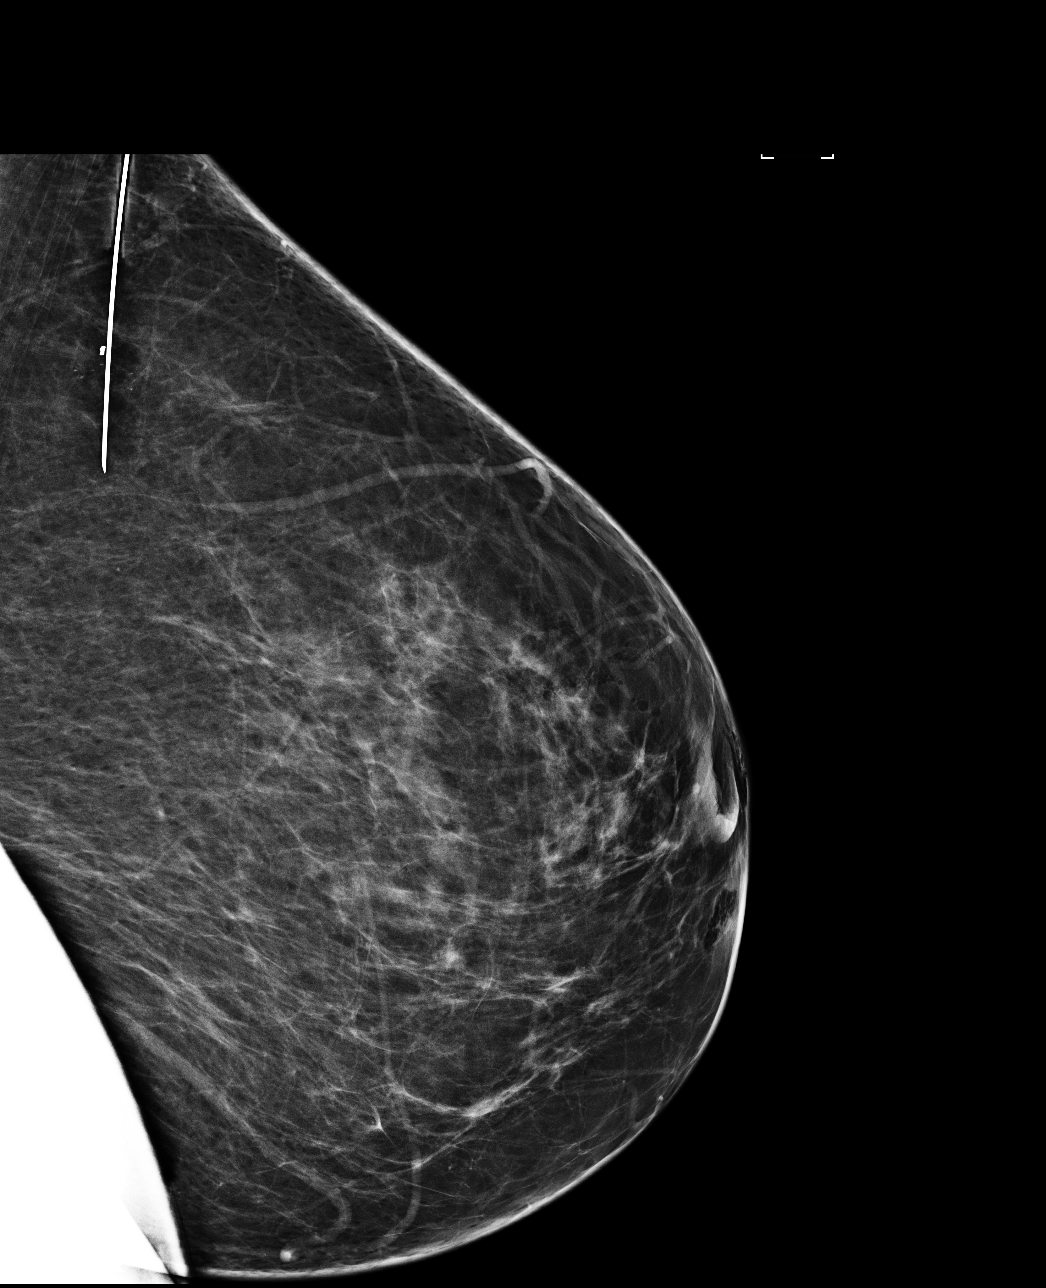

[L ML (2 of 2)]
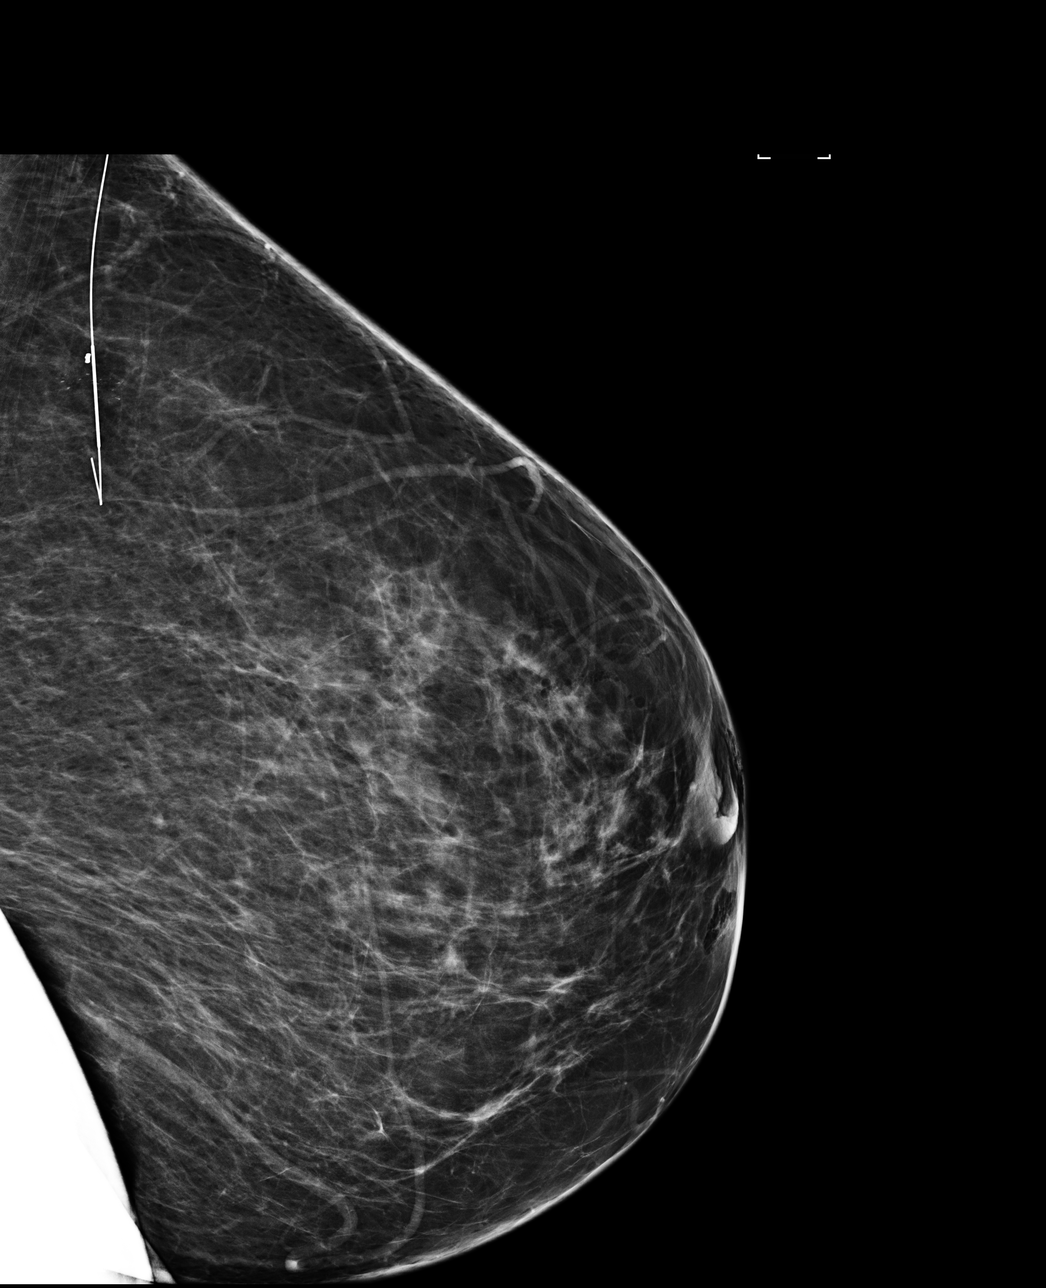

[4 of 4 positions shown; findings below may reference images not displayed]

FINDINGS: Patient presents for needle localization prior to left breast
lumpectomy. I met with the patient and we discussed the procedure of
needle localization including benefits and alternatives. We
discussed the high likelihood of a successful procedure. We
discussed the risks of the procedure, including infection, bleeding,
tissue injury, and further surgery. Informed, written consent was
given. The usual time-out protocol was performed immediately prior
to the procedure.

Using mammographic guidance, sterile technique, 1% lidocaine and a 7
cm modified Kopans needle, left breast upper inner quadrant
calcifications and top hat tissue marker were localized using
superior approach. The images were marked for Dr. Zebisch.
IMPRESSION: Needle localization left breast. No apparent complications.

## 2018-10-17 ENCOUNTER — Ambulatory Visit: Payer: BC Managed Care – PPO | Attending: Oncology

## 2018-10-17 ENCOUNTER — Other Ambulatory Visit: Payer: BLUE CROSS/BLUE SHIELD

## 2018-10-21 ENCOUNTER — Inpatient Hospital Stay: Payer: BC Managed Care – PPO | Admitting: Oncology

## 2018-12-09 ENCOUNTER — Ambulatory Visit
Admission: RE | Admit: 2018-12-09 | Discharge: 2018-12-09 | Disposition: A | Discharge status: Home / Self Care | Payer: BC Managed Care – PPO | Source: Ambulatory Visit | Attending: Oncology | Admitting: Oncology

## 2018-12-09 DIAGNOSIS — C50212 Malignant neoplasm of upper-inner quadrant of left female breast: Secondary | ICD-10-CM

## 2018-12-09 DIAGNOSIS — Z170 Estrogen receptor positive status [ER+]: Secondary | ICD-10-CM | POA: Insufficient documentation

## 2018-12-16 ENCOUNTER — Encounter: Payer: Self-pay | Admitting: Family Medicine

## 2018-12-16 ENCOUNTER — Telehealth: Payer: Self-pay | Admitting: Family Medicine

## 2018-12-16 ENCOUNTER — Other Ambulatory Visit: Payer: Self-pay

## 2018-12-16 ENCOUNTER — Ambulatory Visit: Payer: BC Managed Care – PPO | Admitting: Family Medicine

## 2018-12-16 VITALS — BP 122/78 | HR 63 | Temp 98.4°F | Ht 70.0 in | Wt 233.8 lb

## 2018-12-16 DIAGNOSIS — Z853 Personal history of malignant neoplasm of breast: Secondary | ICD-10-CM

## 2018-12-16 DIAGNOSIS — R739 Hyperglycemia, unspecified: Secondary | ICD-10-CM

## 2018-12-16 DIAGNOSIS — Z23 Encounter for immunization: Secondary | ICD-10-CM

## 2018-12-16 DIAGNOSIS — Z170 Estrogen receptor positive status [ER+]: Secondary | ICD-10-CM

## 2018-12-16 DIAGNOSIS — C50212 Malignant neoplasm of upper-inner quadrant of left female breast: Secondary | ICD-10-CM

## 2018-12-16 LAB — POCT GLYCOSYLATED HEMOGLOBIN (HGB A1C): Hemoglobin A1C: 6.5 % — AB (ref 4.0–5.6)

## 2018-12-16 NOTE — Telephone Encounter (Addendum)
Pt aware. Pt is not home, will call back in the AM to get the number to call to schedule. Nothing further needed.

## 2018-12-16 NOTE — Patient Instructions (Addendum)
Good to see you today  Keep up the good work with weight loss  Your blood sugar is improved!

## 2018-12-16 NOTE — Telephone Encounter (Signed)
Please call patient and tell her that she can call [facility] to schedule her bone density study. The number is [phone].  The order is already in the computer.

## 2018-12-16 NOTE — Progress Notes (Signed)
Subjective:    Patient ID: Olivia Kirk, female    DOB: March 26, 1955, 63 y.o.   MRN: [id number]  HPI This is a 63 female who presents today for follow up of elevated blood sugar and to discuss recent mammogram results.  Has been doing well.   Elevated blood sugar- 6 months ago hemoglobin A1c was 6.6 unchanged from previous 6 months.  The patient has been adamant about not going on any medication.  She has been making healthier food choices and has lost a few pounds since last visit.  History of breast cancer- she had negative mammogram earlier this month.  For some reason they did not do her bone density study and she will need to call and schedule this.  The order is in.  Past Medical History:  Diagnosis Date  . Anemia    H/O  . Arthritis    FINGER MIDDLE FINGER RIGHT HAND, LEFT KNEE  . 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  [place] Kitchen GERD (gastroesophageal reflux disease)    RARE  . Hyperlipidemia   . Hypertension   . Personal history of radiation therapy 2018   left breast cancer   Past Surgical History:  Procedure Laterality Date  . BREAST BIOPSY Left 06/06/2016   left stereo. invasive mammary carcinoma  . BREAST LUMPECTOMY Left 07/03/2016   invasive mammary carcinoma  . BREAST LUMPECTOMY WITH NEEDLE LOCALIZATION Left 07/03/2016   Procedure: BREAST LUMPECTOMY WITH NEEDLE LOCALIZATION;  Surgeon: Robert Bellow, MD;  Location: [facility] ORS;  Service: General;  Laterality: Left;  . BREAST LUMPECTOMY WITH SENTINEL LYMPH NODE BIOPSY Left 07/03/2016   Procedure: BREAST LUMPECTOMY WITH SENTINEL LYMPH NODE BX;  Surgeon: Robert Bellow, MD;  Location: [facility] ORS;  Service: General;  Laterality: Left;  . hemrrhoid    . TUBAL LIGATION     Family History  Problem Relation Age of Onset  . Hypertension Mother   . Cancer Father   . Heart disease Father   . Hypertension Maternal Grandmother   . Stroke  Maternal Grandmother   . Breast cancer Paternal Aunt        64   Social History   Tobacco Use  . Smoking status: Former Smoker    Packs/day: 0.25    Years: 6.00    Pack years: 1.50    Types: Cigarettes    Quit date: 05/07/1987    Years since quitting: 31.6  . Smokeless tobacco: Never Used  Substance Use Topics  . Alcohol use: Yes    Alcohol/week: 0.0 standard drinks    Comment: Occassionally  . Drug use: No      Review of Systems Per HPI    Objective:   Physical Exam Vitals signs reviewed.  Constitutional:      General: She is not in acute distress.    Appearance: Normal appearance. She is obese. She is not ill-appearing, toxic-appearing or diaphoretic.  HENT:     Head: Normocephalic and atraumatic.  Eyes:     Conjunctiva/sclera: Conjunctivae normal.  Neck:     Musculoskeletal: Normal range of motion and neck supple.  Cardiovascular:     Rate and Rhythm: Normal rate.  Pulmonary:     Effort: Pulmonary effort is normal.  Musculoskeletal:     Right lower leg: Edema (trace) present.     Left lower leg: Edema (trace) present.  Skin:    General: Skin is warm  and dry.  Neurological:     Mental Status: She is alert and oriented to person, place, and time.  Psychiatric:        Mood and Affect: Mood normal.        Behavior: Behavior normal.        Thought Content: Thought content normal.        Judgment: Judgment normal.      BP 122/78 (BP Location: Left Arm, Patient Position: Sitting, Cuff Size: Normal)   Pulse 63   Temp 98.4 F (36.9 C) (Temporal)   Ht '5\' 10"'$  (1.778 m)   Wt 233 lb 12.8 oz (106.1 kg)   SpO2 97%   BMI 33.55 kg/m  Wt Readings from Last 3 Encounters:  12/16/18 233 lb 12.8 oz (106.1 kg)  01/03/18 234 lb (106.1 kg)  11/28/17 236 lb (107 kg)   Results for orders placed or performed in visit on 12/16/18  HgB A1c  Result Value Ref Range   Hemoglobin A1C 6.5 (A) 4.0 - 5.6 %   HbA1c POC (<> result, manual entry)     HbA1c, POC (prediabetic  range)     HbA1c, POC (controlled diabetic range)          Assessment & Plan:  1. Elevated blood sugar - slightly decreased hgba1c. She is adamant about not starting medication. Discussed diet and weight loss, will recheck in 55month.   - HgB A1c  2. Need for influenza vaccination - Flu Vaccine QUAD 6+ mos PF IM (Fluarix Quad PF)  3. Malignant neoplasm of upper-inner quadrant of left breast in [place], [place] - continued annual follow up with oncology and annual mammograms - she is on Femera and needs bone density, order is in, she was instructed to call and schedule.   - follow up in 6 months DClarene Reamer FNP-BC  [facility]  12/16/2018 4:37 PM

## 2018-12-31 ENCOUNTER — Other Ambulatory Visit: Payer: Self-pay | Admitting: Family Medicine

## 2018-12-31 DIAGNOSIS — E785 Hyperlipidemia, unspecified: Secondary | ICD-10-CM

## 2018-12-31 DIAGNOSIS — F419 Anxiety disorder, unspecified: Secondary | ICD-10-CM

## 2019-04-16 ENCOUNTER — Other Ambulatory Visit: Payer: Self-pay | Admitting: Family Medicine

## 2019-04-16 DIAGNOSIS — E785 Hyperlipidemia, unspecified: Secondary | ICD-10-CM

## 2019-04-16 DIAGNOSIS — F419 Anxiety disorder, unspecified: Secondary | ICD-10-CM

## 2019-04-16 NOTE — Telephone Encounter (Signed)
Refill request for  Medication: Zoloft Last Filled: 01/01/2019 #90 x 0 refills Previous / Upcoming Appt: last OV 12/16/18, upcoming visit is With Family Medicine Elby Beck, [place]) 06/16/2019 at 3:30 PM   Please advise Debbie, thanks.

## 2019-04-30 ENCOUNTER — Other Ambulatory Visit: Payer: Self-pay

## 2019-04-30 ENCOUNTER — Telehealth: Payer: Self-pay | Admitting: *Deleted

## 2019-04-30 ENCOUNTER — Ambulatory Visit (INDEPENDENT_AMBULATORY_CARE_PROVIDER_SITE_OTHER): Payer: Self-pay | Admitting: Family Medicine

## 2019-04-30 ENCOUNTER — Encounter: Payer: Self-pay | Admitting: Family Medicine

## 2019-04-30 VITALS — BP 128/88 | HR 90 | Temp 98.2°F | Ht 70.0 in | Wt 246.8 lb

## 2019-04-30 DIAGNOSIS — M545 Low back pain, unspecified: Secondary | ICD-10-CM

## 2019-04-30 DIAGNOSIS — Z5989 Other problems related to housing and economic circumstances: Secondary | ICD-10-CM

## 2019-04-30 DIAGNOSIS — I10 Essential (primary) hypertension: Secondary | ICD-10-CM

## 2019-04-30 DIAGNOSIS — G8929 Other chronic pain: Secondary | ICD-10-CM

## 2019-04-30 DIAGNOSIS — R7303 Prediabetes: Secondary | ICD-10-CM

## 2019-04-30 DIAGNOSIS — E119 Type 2 diabetes mellitus without complications: Secondary | ICD-10-CM

## 2019-04-30 DIAGNOSIS — Z598 Other problems related to housing and economic circumstances: Secondary | ICD-10-CM

## 2019-04-30 DIAGNOSIS — M25512 Pain in left shoulder: Secondary | ICD-10-CM

## 2019-04-30 DIAGNOSIS — F419 Anxiety disorder, unspecified: Secondary | ICD-10-CM

## 2019-04-30 DIAGNOSIS — Z599 Problem related to housing and economic circumstances, unspecified: Secondary | ICD-10-CM

## 2019-04-30 DIAGNOSIS — E669 Obesity, unspecified: Secondary | ICD-10-CM

## 2019-04-30 DIAGNOSIS — E782 Mixed hyperlipidemia: Secondary | ICD-10-CM

## 2019-04-30 DIAGNOSIS — M25561 Pain in right knee: Secondary | ICD-10-CM

## 2019-04-30 DIAGNOSIS — M25562 Pain in left knee: Secondary | ICD-10-CM

## 2019-04-30 NOTE — Telephone Encounter (Signed)
Noted  

## 2019-04-30 NOTE — Progress Notes (Signed)
Subjective:    Patient ID: Olivia Kirk, female    DOB: 05-16-1955, 64 y.o.   MRN: [id number]  HPI Chief Complaint  Patient presents with  . Tremors    Pt states that she feels jittery/shaky all over and left arm numbness and tingling. Pt states this started about 1 week ago. Denies headache. Some lightheadedness. Increased fatigue. Elevated blood pressure 148/87 today, pt states that yesterday it was around 160s/100s. Total body shaking and trembling can be seen and felt.    Jittery feeling- for about 2 years, now seems to happen when she is not feeling stressed out. Before, happened when she was feeling anxious.  Her family repeatedly tells her that she is stressed.  She currently is without health insurance.  She will be 64 in September.  Left arm numbess/tingling- started last week. No known injury or overuse.  Sore in upper arm.  Has not taken anything for this.  No weakness.  Fatigue- has noticed decreased energy level.  Sleeps poorly.  Likes to go to bed late but then gets up early.  Has a hard time getting her mind slowed down.  Back pain- after washing dishes/ sweeping. Mid back pain, no radiation, no numbness or tingling.  Has not tried any medication for this.  Severe knee pain, worse in left knee.  She notices it mostly at night.  Has not taken any medication.  Elevated BP readings at home. Usually uses automatic cuff, daughter checked last night and was elevated, she thinks the reading was 142/106. No chest pain, occasionally feels SOB.   Stress- increased family stress, daughter and her 3 kids (73, 40, 7 and 3 cats) and son have moved in with her. Husband gets jealous.  Children are doing remote learning from home.  Husband is retired.  She enjoys her work and getting out of the house.  Weight gain-she attributes this to pandemic and having the kids at home with easier access to snack foods.  Review of Systems Per HPI    Objective:   Physical Exam Vitals reviewed.   Constitutional:      Appearance: Normal appearance. She is obese.  HENT:     Head: Normocephalic and atraumatic.  Eyes:     Conjunctiva/sclera: Conjunctivae normal.  Cardiovascular:     Rate and Rhythm: Normal rate and regular rhythm.     Heart sounds: Normal heart sounds.  Pulmonary:     Effort: Pulmonary effort is normal.     Breath sounds: Normal breath sounds.  Musculoskeletal:     Left shoulder: Tenderness (generalized, upper arm) present. No swelling or deformity. Normal range of motion. Normal strength. Normal pulse.     Cervical back: Normal range of motion and neck supple. No rigidity or tenderness.     Thoracic back: Normal.     Lumbar back: Tenderness (paraspinals) present. No bony tenderness. Normal range of motion.     Comments: Normal gait.  Upper extremity/lower extremity strength 5 out of 5.  Skin:    General: Skin is warm and dry.  Neurological:     Mental Status: She is alert.       BP 128/88 (BP Location: Left Arm, Patient Position: Sitting, Cuff Size: Normal)   Pulse 90   Temp 98.2 F (36.8 C) (Temporal)   Ht 5\' 10"  (1.778 m)   Wt 246 lb 12.8 oz (111.9 kg)   SpO2 96%   BMI 35.41 kg/m  Wt Readings from Last 3 Encounters:  04/30/19 246  lb 12.8 oz (111.9 kg)  12/16/18 233 lb 12.8 oz (106.1 kg)  01/03/18 234 lb (106.1 kg)   BP Readings from Last 3 Encounters:  04/30/19 128/88  12/16/18 122/78  01/03/18 134/87       Assessment & Plan:  1. Essential hypertension -Acceptable blood pressure in office today.  Unclear if home readings are accurate. - Hemoglobin A1c - Comprehensive metabolic panel - TSH - CBC with Differential - Lipid Panel  2. Mixed hyperlipidemia - Lipid Panel  3. Obesity (BMI 30-39.9) -Discussed impact of her obesity on her joints and encouraged her to make healthy food choices, avoid processed/fast food, avoid calories and beverages - Comprehensive metabolic panel - TSH - CBC with Differential - Lipid Panel  4.  Prediabetes - Hemoglobin A1c  5. Acute pain of left shoulder -No worrisome findings on history or physical exam.  We will have her start with some acetaminophen and gentle exercises.  If renal function is normal, will advise low-dose over-the-counter NSAIDs.  6. Anxiety -Patient was significantly increased stressors recently.  I think that is her definitely having a role in her symptoms.  Discussed increasing her sertraline from 50 mg daily to 75 mg daily.  If she tolerates, she can increase to 100 mg daily.  7. Financial difficulties - Ambulatory referral to [facility]  8. Does not have health insurance - Ambulatory referral to [facility]  9. Chronic midline low back pain without sciatica -Discussed using some over-the-counter acetaminophen and provided her with written and verbal instructions for back exercises  10. Chronic pain of both knees -We will see if improved with acetaminophen.  Discussed weight loss as important component of joint health.  -She has follow-up on file for next month.  She was instructed to call the office if she needs sooner follow-up or prescription refills.  Clarene Reamer, FNP-BC  [facility]  04/30/2019 4:32 PM

## 2019-04-30 NOTE — Patient Instructions (Signed)
Good to see you today  Please increase your sertraline to 1 and 1/2 tablets daily. If you are doing ok with this dose, can increase to 2 tablets. Let me know if you need a refill before your visit.   You will get a call with your lab tests and whether or not I advise any anti- inflammatory medications.   Take 2 Tylenol (generic is fine) every 8 to 12 hours- see if this helps your knee pain, left arm pain, back pain.   Gentle range of motion of shoulders and neck  Back Exercises These exercises help to make your trunk and back strong. They also help to keep the lower back flexible. Doing these exercises can help to prevent back pain or lessen existing pain.  If you have back pain, try to do these exercises 2-3 times each day or as told by your doctor.  As you get better, do the exercises once each day. Repeat the exercises more often as told by your doctor.  To stop back pain from coming back, do the exercises once each day, or as told by your doctor. Exercises Single knee to chest Do these steps 3-5 times in a row for each leg: 1. Lie on your back on a firm bed or the floor with your legs stretched out. 2. Bring one knee to your chest. 3. Grab your knee or thigh with both hands and hold them it in place. 4. Pull on your knee until you feel a gentle stretch in your lower back or buttocks. 5. Keep doing the stretch for 10-30 seconds. 6. Slowly let go of your leg and straighten it. Pelvic tilt Do these steps 5-10 times in a row: 1. Lie on your back on a firm bed or the floor with your legs stretched out. 2. Bend your knees so they point up to the ceiling. Your feet should be flat on the floor. 3. Tighten your lower belly (abdomen) muscles to press your lower back against the floor. This will make your tailbone point up to the ceiling instead of pointing down to your feet or the floor. 4. Stay in this position for 5-10 seconds while you gently tighten your muscles and breathe  evenly. Cat-cow Do these steps until your lower back bends more easily: 1. Get on your hands and knees on a firm surface. Keep your hands under your shoulders, and keep your knees under your hips. You may put padding under your knees. 2. Let your head hang down toward your chest. Tighten (contract) the muscles in your belly. Point your tailbone toward the floor so your lower back becomes rounded like the back of a cat. 3. Stay in this position for 5 seconds. 4. Slowly lift your head. Let the muscles of your belly relax. Point your tailbone up toward the ceiling so your back forms a sagging arch like the back of a cow. 5. Stay in this position for 5 seconds.  Press-ups Do these steps 5-10 times in a row: 1. Lie on your belly (face-down) on the floor. 2. Place your hands near your head, about shoulder-width apart. 3. While you keep your back relaxed and keep your hips on the floor, slowly straighten your arms to raise the top half of your body and lift your shoulders. Do not use your back muscles. You may change where you place your hands in order to make yourself more comfortable. 4. Stay in this position for 5 seconds. 5. Slowly return to lying flat  on the floor.  Bridges Do these steps 10 times in a row: 1. Lie on your back on a firm surface. 2. Bend your knees so they point up to the ceiling. Your feet should be flat on the floor. Your arms should be flat at your sides, next to your body. 3. Tighten your butt muscles and lift your butt off the floor until your waist is almost as high as your knees. If you do not feel the muscles working in your butt and the back of your thighs, slide your feet 1-2 inches farther away from your butt. 4. Stay in this position for 3-5 seconds. 5. Slowly lower your butt to the floor, and let your butt muscles relax. If this exercise is too easy, try doing it with your arms crossed over your chest. Belly crunches Do these steps 5-10 times in a row: 1. Lie on  your back on a firm bed or the floor with your legs stretched out. 2. Bend your knees so they point up to the ceiling. Your feet should be flat on the floor. 3. Cross your arms over your chest. 4. Tip your chin a little bit toward your chest but do not bend your neck. 5. Tighten your belly muscles and slowly raise your chest just enough to lift your shoulder blades a tiny bit off of the floor. Avoid raising your body higher than that, because it can put too much stress on your low back. 6. Slowly lower your chest and your head to the floor. Back lifts Do these steps 5-10 times in a row: 1. Lie on your belly (face-down) with your arms at your sides, and rest your forehead on the floor. 2. Tighten the muscles in your legs and your butt. 3. Slowly lift your chest off of the floor while you keep your hips on the floor. Keep the back of your head in line with the curve in your back. Look at the floor while you do this. 4. Stay in this position for 3-5 seconds. 5. Slowly lower your chest and your face to the floor. Contact a doctor if:  Your back pain gets a lot worse when you do an exercise.  Your back pain does not get better 2 hours after you exercise. If you have any of these problems, stop doing the exercises. Do not do them again unless your doctor says it is okay. Get help right away if:  You have sudden, very bad back pain. If this happens, stop doing the exercises. Do not do them again unless your doctor says it is okay. This information is not intended to replace advice given to you by your health care provider. Make sure you discuss any questions you have with your health care provider. Document Revised: 11/29/2017 Document Reviewed: 11/29/2017 Elsevier Patient Education  2020 [employer].

## 2019-04-30 NOTE — Telephone Encounter (Signed)
Patient called stating that she has some numbness in her left arm that started last week. Patient stated that her blood pressure has been elevated and she has had some shaking. Patient stated that all of this started last week and she thinks that she may have a pinched nerve. Patient scheduled for an appointment to see Tor Netters NP today at 2:00. ER precautions given to patient and she verbalized understanding.

## 2019-05-01 LAB — LIPID PANEL
Cholesterol: 154 mg/dL (ref 0–200)
HDL: 41.9 mg/dL (ref >39.00)
LDL Cholesterol: 79 mg/dL (ref 0–99)
NonHDL: 112.12
Total CHOL/HDL Ratio: 4
Triglycerides: 167 mg/dL — ABNORMAL HIGH (ref 0.0–149.0)
VLDL: 33.4 mg/dL (ref 0.0–40.0)

## 2019-05-01 LAB — CBC WITH DIFFERENTIAL/PLATELET
Basophils Absolute: 0.1 10*3/uL (ref 0.0–0.1)
Basophils Relative: 1.1 % (ref 0.0–3.0)
Eosinophils Absolute: 0.1 10*3/uL (ref 0.0–0.7)
Eosinophils Relative: 1.8 % (ref 0.0–5.0)
HCT: 35.1 % — ABNORMAL LOW (ref 36.0–46.0)
Hemoglobin: 11.3 g/dL — ABNORMAL LOW (ref 12.0–15.0)
Lymphocytes Relative: 28.5 % (ref 12.0–46.0)
Lymphs Abs: 2.2 10*3/uL (ref 0.7–4.0)
MCHC: 32.2 g/dL (ref 30.0–36.0)
MCV: 81.5 fl (ref 78.0–100.0)
Monocytes Absolute: 0.6 10*3/uL (ref 0.1–1.0)
Monocytes Relative: 7.6 % (ref 3.0–12.0)
Neutro Abs: 4.6 10*3/uL (ref 1.4–7.7)
Neutrophils Relative %: 61 % (ref 43.0–77.0)
Platelets: 280 10*3/uL (ref 150.0–400.0)
RBC: 4.3 Mil/uL (ref 3.87–5.11)
RDW: 14 % (ref 11.5–15.5)
WBC: 7.6 10*3/uL (ref 4.0–10.5)

## 2019-05-01 LAB — COMPREHENSIVE METABOLIC PANEL
ALT: 14 U/L (ref 0–35)
AST: 15 U/L (ref 0–37)
Albumin: 4.4 g/dL (ref 3.5–5.2)
Alkaline Phosphatase: 76 U/L (ref 39–117)
BUN: 19 mg/dL (ref 6–23)
CO2: 29 mEq/L (ref 19–32)
Calcium: 10.2 mg/dL (ref 8.4–10.5)
Chloride: 103 mEq/L (ref 96–112)
Creatinine, Ser: 0.98 mg/dL (ref 0.40–1.20)
GFR: 69.05 mL/min (ref >60.00)
Glucose, Bld: 99 mg/dL (ref 70–99)
Potassium: 3.8 mEq/L (ref 3.5–5.1)
Sodium: 140 mEq/L (ref 135–145)
Total Bilirubin: 0.4 mg/dL (ref 0.2–1.2)
Total Protein: 7.1 g/dL (ref 6.0–8.3)

## 2019-05-01 LAB — TSH: TSH: 1.08 u[IU]/mL (ref 0.35–4.50)

## 2019-05-01 LAB — HEMOGLOBIN A1C: Hgb A1c MFr Bld: 7.1 % — ABNORMAL HIGH (ref 4.6–6.5)

## 2019-05-05 ENCOUNTER — Telehealth: Payer: Self-pay

## 2019-05-05 MED ORDER — METFORMIN HCL ER 500 MG PO TB24: 500.0000 mg | ORAL_TABLET | Freq: Every day | ORAL | 11 refills | Status: DC

## 2019-05-05 NOTE — Telephone Encounter (Signed)
05/05/2019 Spoke with patient for permission to schedule phone appointment with local Affordable Care Navigator and told patient that Terri Skains  would be contacting her and what documents she would need for the call. Ambrose Mantle [phone]

## 2019-05-05 NOTE — Addendum Note (Signed)
Addended by: Clarene Reamer B on: 05/05/2019 08:59 AM   Modules accepted: Orders

## 2019-05-07 ENCOUNTER — Telehealth: Payer: Self-pay | Admitting: Family Medicine

## 2019-05-07 NOTE — Telephone Encounter (Signed)
Phone call to pt. Pt states she is trying to reach her provider at [facility]. Pt informed that a message was sent to [facility]. Pt states she will call her [facility] provider, no further assistance needed by [facility].

## 2019-05-09 ENCOUNTER — Telehealth: Payer: Self-pay

## 2019-05-09 NOTE — Telephone Encounter (Signed)
Pt said she was supposed to get call from our office about labs and instead got a call from [facility] and pt wants to know why. Ashtyn CMA is speaking with pt.

## 2019-05-12 ENCOUNTER — Other Ambulatory Visit: Payer: Self-pay

## 2019-05-12 NOTE — Telephone Encounter (Signed)
I spoke with the patient on Friday and made her aware that I am not sure why the HD was contacting her. There are no results or documentation pointing to anything being reported to them to follow. I advised that maybe this was an erroneous call.   Results reviewed with patient, expressed understanding. Nothing further needed.

## 2019-05-15 ENCOUNTER — Telehealth: Payer: Self-pay

## 2019-05-15 NOTE — Telephone Encounter (Signed)
05/15/2019 Spoke with patient about scheduled meeting with the affordable care navigator. Patient decided not to speak with the care navigator and wait until she turns 64.  She did not want any other assistance but will call if she changes her mind.  Ambrose Mantle [phone]

## 2019-05-15 NOTE — Telephone Encounter (Signed)
Thank you so much for your efforts.

## 2019-06-09 ENCOUNTER — Ambulatory Visit (LOCAL_COMMUNITY_HEALTH_CENTER): Payer: Self-pay

## 2019-06-09 ENCOUNTER — Other Ambulatory Visit: Payer: Self-pay

## 2019-06-09 DIAGNOSIS — Z111 Encounter for screening for respiratory tuberculosis: Secondary | ICD-10-CM

## 2019-06-12 ENCOUNTER — Ambulatory Visit (LOCAL_COMMUNITY_HEALTH_CENTER): Payer: Self-pay

## 2019-06-12 ENCOUNTER — Other Ambulatory Visit: Payer: Self-pay

## 2019-06-12 DIAGNOSIS — Z111 Encounter for screening for respiratory tuberculosis: Secondary | ICD-10-CM

## 2019-06-12 LAB — TB SKIN TEST
Induration: 0 mm
TB Skin Test: NEGATIVE

## 2019-06-16 ENCOUNTER — Ambulatory Visit: Payer: BC Managed Care – PPO | Admitting: Family Medicine

## 2019-06-16 DIAGNOSIS — Z0289 Encounter for other administrative examinations: Secondary | ICD-10-CM

## 2019-07-26 ENCOUNTER — Other Ambulatory Visit: Payer: Self-pay | Admitting: Family Medicine

## 2019-07-26 DIAGNOSIS — F419 Anxiety disorder, unspecified: Secondary | ICD-10-CM

## 2019-07-26 DIAGNOSIS — E785 Hyperlipidemia, unspecified: Secondary | ICD-10-CM

## 2019-07-29 NOTE — Telephone Encounter (Signed)
Spoke with patient regarding refill requests. Patient No Showed her appt last month. I noticed when reviewing the chart that her Sertraline Rx was not dosed appropriately based on what the last OV notes advised.   Per Feb OV notes Anxiety -Patient was significantly increased stressors recently.  I think that is her definitely having a role in her symptoms.  Discussed increasing her sertraline from 50 mg daily to 75 mg daily.  If she tolerates, she can increase to 100 mg daily.  Pt states that she tried the 100mg  and it worked well for her but she ran out bc of the qty of tablets was not adjusted for the way she was taking it. I advised that we can adjust the qty for the 100mg  if that's what she wants to do. Pt requested 100mg  (2 tabs daily) and made an appt for a follow up with Debbie in one month.   Pt aware that all of her medications will be filled up until her June appt -- at that time Jackelyn Poling can adjust any dosing and will send in further refills.   Nothing further needed.

## 2019-08-25 ENCOUNTER — Encounter: Payer: Self-pay | Admitting: Family Medicine

## 2019-08-25 ENCOUNTER — Telehealth: Payer: Self-pay | Admitting: Family Medicine

## 2019-08-25 ENCOUNTER — Other Ambulatory Visit: Payer: Self-pay

## 2019-08-25 ENCOUNTER — Ambulatory Visit (INDEPENDENT_AMBULATORY_CARE_PROVIDER_SITE_OTHER): Payer: Self-pay | Admitting: Family Medicine

## 2019-08-25 VITALS — BP 138/88 | HR 84 | Temp 98.3°F | Ht 70.0 in | Wt 236.0 lb

## 2019-08-25 DIAGNOSIS — E119 Type 2 diabetes mellitus without complications: Secondary | ICD-10-CM

## 2019-08-25 DIAGNOSIS — R202 Paresthesia of skin: Secondary | ICD-10-CM

## 2019-08-25 DIAGNOSIS — R200 Anesthesia of skin: Secondary | ICD-10-CM

## 2019-08-25 NOTE — Patient Instructions (Signed)
Good to see you today  Schedule your Welcome to Medicare visit for early December  I will notify you of labs.  Keep a log of your hand/ shoulder numbness, if increased, let me know and we can check xray of your neck

## 2019-08-25 NOTE — Telephone Encounter (Signed)
Pt was seen today and left before getting lab drawn  Called and left a detailed message to call back and schedule a labs only appt.

## 2019-08-25 NOTE — Progress Notes (Signed)
Subjective:    Patient ID: Olivia Kirk, female    DOB: 08-Sep-1955, 64 y.o.   MRN: [id number]  HPI Chief Complaint  Patient presents with  . Follow-up    Pt c/o numbness in right hand and left shoulder. Tingling at times - similar to when your extremity "falls asleep". Denies pain.    This is a 64 yo female who presents today for follow up of chronic medical conditions as well as above concern. Some increased stress lately, helping her children with their transportation issues. Her daughter and granddaughter live with her.   DM type 2- has been watching diet, eating salads.   Right hand tingling- mainly thumb, comes and goes. No pain, no weakness. Right hand dominant. No neck pain. No new activities. Lasts for a couple of seconds a couple of times a day. No worsening or increased frequency. Does not interfere with sleep.   Left shoulder tingling, numbness- unsure when it started, but around same time as hand symptoms.   Review of Systems Denies chest pain, SOB, abdominal pain, leg swelling    Objective:   Physical Exam Vitals reviewed.  Constitutional:      General: She is not in acute distress.    Appearance: Normal appearance. She is obese. She is not ill-appearing, toxic-appearing or diaphoretic.  HENT:     Head: Normocephalic and atraumatic.     Right Ear: External ear normal.     Left Ear: External ear normal.  Eyes:     Conjunctiva/sclera: Conjunctivae normal.  Cardiovascular:     Rate and Rhythm: Normal rate and regular rhythm.     Heart sounds: Normal heart sounds.  Pulmonary:     Effort: Pulmonary effort is normal.     Breath sounds: Normal breath sounds.  Musculoskeletal:     Cervical back: Normal range of motion and neck supple. No rigidity or tenderness.     Comments: Normal gait. Neck, shoulders, elbows, wrists, fingers with normal range of motion.  No spinal process tenderness. No shoulder, elbow, hand, finger swelling, tenderness.  UE strength 5/5  throughout.   Lymphadenopathy:     Cervical: No cervical adenopathy.  Skin:    General: Skin is warm and dry.  Neurological:     Mental Status: She is alert and oriented to person, place, and time.  Psychiatric:        Mood and Affect: Mood normal.        Behavior: Behavior normal.        Thought Content: Thought content normal.        Judgment: Judgment normal.      BP 138/88 (BP Location: Left Arm, Patient Position: Sitting, Cuff Size: Normal)   Pulse 84   Temp 98.3 F (36.8 C) (Temporal)   Ht 5\' 10"  (1.778 m)   Wt 236 lb (107 kg)   SpO2 97%   BMI 33.86 kg/m       Wt Readings from Last 3 Encounters:  08/25/19 236 lb (107 kg)  04/30/19 246 lb 12.8 oz (111.9 kg)  12/16/18 233 lb 12.8 oz (106.1 kg)    Assessment & Plan:  1. Controlled type 2 diabetes mellitus without complication, without long-term current use of insulin (HCC) - Hemoglobin A1c; Future - Microalbumin / creatinine urine ratio; Future  2. Numbness and tingling in right hand Patient Instructions  Good to see you today  Schedule your Welcome to Medicare visit for early December  I will notify you of labs.  Keep  a log of your hand/ shoulder numbness, if increased, let me know and we can check xray of your neck  This visit occurred during the SARS-CoV-2 public health emergency.  Safety protocols were in place, including screening questions prior to the visit, additional usage of staff PPE, and extensive cleaning of exam room while observing appropriate contact time as indicated for disinfecting solutions.      Clarene Reamer, FNP-BC  [facility]  08/25/2019 4:40 PM

## 2019-08-25 NOTE — Telephone Encounter (Signed)
Noted  

## 2019-09-01 NOTE — Telephone Encounter (Signed)
Pt scheduled for lab appt 09/03/19 at 1:45pm  Nothing further needed.

## 2019-09-03 ENCOUNTER — Other Ambulatory Visit: Payer: Self-pay

## 2019-09-03 DIAGNOSIS — E119 Type 2 diabetes mellitus without complications: Secondary | ICD-10-CM

## 2019-09-03 LAB — POCT GLYCOSYLATED HEMOGLOBIN (HGB A1C): Hemoglobin A1C: 6.4 % — AB (ref 4.0–5.6)

## 2019-09-03 LAB — MICROALBUMIN / CREATININE URINE RATIO
Creatinine,U: 215.1 mg/dL
Microalb Creat Ratio: 0.8 mg/g (ref 0.0–30.0)
Microalb, Ur: 1.7 mg/dL (ref 0.0–1.9)

## 2019-09-04 ENCOUNTER — Other Ambulatory Visit: Payer: Self-pay | Admitting: Family Medicine

## 2019-09-04 DIAGNOSIS — F419 Anxiety disorder, unspecified: Secondary | ICD-10-CM

## 2019-09-05 ENCOUNTER — Other Ambulatory Visit: Payer: Self-pay | Admitting: Family Medicine

## 2019-09-05 DIAGNOSIS — E785 Hyperlipidemia, unspecified: Secondary | ICD-10-CM

## 2019-09-05 DIAGNOSIS — F419 Anxiety disorder, unspecified: Secondary | ICD-10-CM

## 2019-09-05 NOTE — Telephone Encounter (Addendum)
There was a duplicate request, they sent it twice, I denied the second one.  The requests must have linked and it cancelled both.   The Sertraline is still needed

## 2019-09-05 NOTE — Telephone Encounter (Signed)
I can't tell why the sertraline was refused? Can you clarify dose with patient? It says 2 x 50 mg.

## 2019-09-05 NOTE — Telephone Encounter (Signed)
Last filled 07/29/19 #60 x 0 refills Upcoming appt 02/25/2020  Please advise, thanks.

## 2020-02-17 ENCOUNTER — Other Ambulatory Visit: Payer: Self-pay | Admitting: Family Medicine

## 2020-02-17 DIAGNOSIS — E119 Type 2 diabetes mellitus without complications: Secondary | ICD-10-CM

## 2020-02-17 DIAGNOSIS — I10 Essential (primary) hypertension: Secondary | ICD-10-CM

## 2020-02-17 DIAGNOSIS — E782 Mixed hyperlipidemia: Secondary | ICD-10-CM

## 2020-02-17 DIAGNOSIS — D649 Anemia, unspecified: Secondary | ICD-10-CM

## 2020-02-17 DIAGNOSIS — E669 Obesity, unspecified: Secondary | ICD-10-CM

## 2020-02-17 DIAGNOSIS — Z114 Encounter for screening for human immunodeficiency virus [HIV]: Secondary | ICD-10-CM

## 2020-02-17 DIAGNOSIS — Z1159 Encounter for screening for other viral diseases: Secondary | ICD-10-CM

## 2020-02-18 ENCOUNTER — Ambulatory Visit: Payer: Self-pay

## 2020-02-18 ENCOUNTER — Other Ambulatory Visit (INDEPENDENT_AMBULATORY_CARE_PROVIDER_SITE_OTHER): Payer: Medicare Other

## 2020-02-18 ENCOUNTER — Other Ambulatory Visit: Payer: Self-pay

## 2020-02-18 ENCOUNTER — Telehealth: Payer: Self-pay

## 2020-02-18 DIAGNOSIS — E669 Obesity, unspecified: Secondary | ICD-10-CM | POA: Diagnosis not present

## 2020-02-18 DIAGNOSIS — E782 Mixed hyperlipidemia: Secondary | ICD-10-CM | POA: Diagnosis not present

## 2020-02-18 DIAGNOSIS — D649 Anemia, unspecified: Secondary | ICD-10-CM

## 2020-02-18 DIAGNOSIS — I10 Essential (primary) hypertension: Secondary | ICD-10-CM

## 2020-02-18 DIAGNOSIS — E119 Type 2 diabetes mellitus without complications: Secondary | ICD-10-CM

## 2020-02-18 DIAGNOSIS — Z1159 Encounter for screening for other viral diseases: Secondary | ICD-10-CM | POA: Diagnosis not present

## 2020-02-18 LAB — CBC WITH DIFFERENTIAL/PLATELET
Basophils Absolute: 0.1 10*3/uL (ref 0.0–0.1)
Basophils Relative: 0.9 % (ref 0.0–3.0)
Eosinophils Absolute: 0.1 10*3/uL (ref 0.0–0.7)
Eosinophils Relative: 0.9 % (ref 0.0–5.0)
HCT: 36.2 % (ref 36.0–46.0)
Hemoglobin: 11.8 g/dL — ABNORMAL LOW (ref 12.0–15.0)
Lymphocytes Relative: 36 % (ref 12.0–46.0)
Lymphs Abs: 2.3 10*3/uL (ref 0.7–4.0)
MCHC: 32.6 g/dL (ref 30.0–36.0)
MCV: 79.7 fl (ref 78.0–100.0)
Monocytes Absolute: 0.4 10*3/uL (ref 0.1–1.0)
Monocytes Relative: 6.2 % (ref 3.0–12.0)
Neutro Abs: 3.7 10*3/uL (ref 1.4–7.7)
Neutrophils Relative %: 56 % (ref 43.0–77.0)
Platelets: 296 10*3/uL (ref 150.0–400.0)
RBC: 4.54 Mil/uL (ref 3.87–5.11)
RDW: 13.4 % (ref 11.5–15.5)
WBC: 6.5 10*3/uL (ref 4.0–10.5)

## 2020-02-18 LAB — COMPREHENSIVE METABOLIC PANEL
ALT: 10 U/L (ref 0–35)
AST: 14 U/L (ref 0–37)
Albumin: 4.5 g/dL (ref 3.5–5.2)
Alkaline Phosphatase: 68 U/L (ref 39–117)
BUN: 12 mg/dL (ref 6–23)
CO2: 32 mEq/L (ref 19–32)
Calcium: 10.3 mg/dL (ref 8.4–10.5)
Chloride: 103 mEq/L (ref 96–112)
Creatinine, Ser: 0.95 mg/dL (ref 0.40–1.20)
GFR: 63.01 mL/min (ref >60.00)
Glucose, Bld: 94 mg/dL (ref 70–99)
Potassium: 4.3 mEq/L (ref 3.5–5.1)
Sodium: 140 mEq/L (ref 135–145)
Total Bilirubin: 0.3 mg/dL (ref 0.2–1.2)
Total Protein: 7.2 g/dL (ref 6.0–8.3)

## 2020-02-18 LAB — VITAMIN B12: Vitamin B-12: 539 pg/mL (ref 211–911)

## 2020-02-18 LAB — LIPID PANEL
Cholesterol: 155 mg/dL (ref 0–200)
HDL: 49.4 mg/dL (ref >39.00)
LDL Cholesterol: 87 mg/dL (ref 0–99)
NonHDL: 105.6
Total CHOL/HDL Ratio: 3
Triglycerides: 95 mg/dL (ref 0.0–149.0)
VLDL: 19 mg/dL (ref 0.0–40.0)

## 2020-02-18 LAB — VITAMIN D 25 HYDROXY (VIT D DEFICIENCY, FRACTURES): VITD: 28.26 ng/mL — ABNORMAL LOW (ref 30.00–100.00)

## 2020-02-18 LAB — FERRITIN: Ferritin: 67.2 ng/mL (ref 10.0–291.0)

## 2020-02-18 LAB — HEMOGLOBIN A1C: Hgb A1c MFr Bld: 6.4 % (ref 4.6–6.5)

## 2020-02-18 NOTE — Telephone Encounter (Signed)
Called [phone] several times trying to complete AWV. No longer a working number. Number has been disconnected/changed. Appointment was cancelled.

## 2020-02-19 LAB — HEPATITIS C ANTIBODY
Hepatitis C Ab: NONREACTIVE
SIGNAL TO CUT-OFF: 0.01 (ref <1.00)

## 2020-02-25 ENCOUNTER — Encounter: Payer: Self-pay | Admitting: Family Medicine

## 2020-02-25 ENCOUNTER — Other Ambulatory Visit: Payer: Self-pay

## 2020-02-25 ENCOUNTER — Ambulatory Visit (INDEPENDENT_AMBULATORY_CARE_PROVIDER_SITE_OTHER): Payer: Medicare Other | Admitting: Family Medicine

## 2020-02-25 VITALS — BP 136/84 | HR 79 | Temp 97.0°F | Ht 72.0 in | Wt 234.8 lb

## 2020-02-25 DIAGNOSIS — Z0000 Encounter for general adult medical examination without abnormal findings: Secondary | ICD-10-CM | POA: Diagnosis not present

## 2020-02-25 DIAGNOSIS — E2839 Other primary ovarian failure: Secondary | ICD-10-CM

## 2020-02-25 DIAGNOSIS — Z23 Encounter for immunization: Secondary | ICD-10-CM

## 2020-02-25 DIAGNOSIS — I10 Essential (primary) hypertension: Secondary | ICD-10-CM

## 2020-02-25 DIAGNOSIS — R200 Anesthesia of skin: Secondary | ICD-10-CM | POA: Diagnosis not present

## 2020-02-25 DIAGNOSIS — E119 Type 2 diabetes mellitus without complications: Secondary | ICD-10-CM

## 2020-02-25 DIAGNOSIS — R202 Paresthesia of skin: Secondary | ICD-10-CM

## 2020-02-25 NOTE — Patient Instructions (Addendum)
Please call and schedule an appointment for screening mammogram and bone density. A referral is not needed.  [phone]  Your vitamin D level is a little low, please add an over the counter vitamin D 1,000 IU  Please schedule a 6 months follow up  In about 4 weeks, get your Shingrix vaccine at your local pharmacy   There is not one right eating plan for everyone.  It may take trial and error to find what will work for you.  It is important to get adequate protein and fiber with your meals.  It is okay to not eat breakfast or to skip meals if you are not hungry.  Avoid snacking between meals.  Unless you are on a fluid restriction, drink 80 to 90 ounces of water a day.  Suggested resources- www.dietdoctor.com/diabetes/diet www.adaptyourlifeacademy.com-there is a quiz to help you determine how many carbohydrates you should eat a day  www.thefastingmethod.com  If you have diabetes or access to a blood sugar machine, I recommend you check your blood sugar daily and keep a log.  Vary the time you check your blood sugar such as fasting, before meal, 2 hours after a meal and at bedtime.  Look for trends with the foods you are eating and be a scientist of your body.  Here are some guidelines to help you with meal planning -  Avoid all processed and packaged foods (bread, pasta, crackers, chips, etc) and beverages containing calories.  Avoid added sugars and excessive natural sugars.  Pay attention to how you feel if you consume artificial sweeteners.  Do they make you more hungry or raise your blood sugar?  With every meal and snack, aim to get 20 g of protein (3 ounces of meat, 4 ounces of fish, 3 eggs, protein powder, 1 cup [place] yogurt, 1 cup cottage cheese, etc.)  Increase fiber in the form of non-starchy vegetables.  These help you feel full with very little carbohydrates and are good for gut health.  Nonstarchy vegetables include summer squash, onions,  peppers, tomatoes, eggplant, broccoli, cauliflower, cabbage, lettuce, spinach.  Have small amounts of good fats such as avocado, nuts, olive oil, nut butters, olives.  Add a little cheese to your meals to make them tasty.   Try to plan your meals for the week and do some meal preparation when able.  If possible, make lunches for the week ahead of time.  Plan a couple of dinners and make enough so you can have leftovers.  Build in a treat once a week.

## 2020-02-25 NOTE — Progress Notes (Signed)
Subjective:    Patient ID: Olivia Kirk, female    DOB: 09-28-1955, 64 y.o.   MRN: [id number]  HPI Chief Complaint  Patient presents with  . Annual Exam    Pt stated that she has noticed that her Rt hand has been going numb and lower back pain   This is a 64 yo female who presents today for annual exam.  Has been doing well in general.  Her daughter and grandchildren continue to live with she and her husband.  Last CPE-  Mammo- 12/09/2018 Pap-  Colonoscopy- 06/03/2012 Tdap- 05/13/2012 Flu-annual Covid 19 vaccine- fully vaccinated, does not have card with her Eye- this year, got new glasses, unsure of location Dental- overdue Exercise- not regular Diet- breakfast- smoothie- vegetables, non dairy yogurt, fruit, tumeric Lunch- skips lunch, Dinner- fast food sandwich, Snacks- candy, Soda- decreased   Stress- she still has her son, daughter and 3 grandchildren living with her. Husband with back pain.   Some continued tremor, worse with not eating, fatigue.   Right hand with numbness and tingling, getting worse. No meds. Comes and goes.  At previous visit, she was complaining of left hand numbness and tingling.  She is interested in referral to hand specialist.  Review of Systems  Constitutional: Negative.   HENT: Negative.   Eyes: Negative.   Respiratory: Negative.   Cardiovascular: Negative.   Gastrointestinal: Negative.   Endocrine: Negative.   Genitourinary: Negative.   Musculoskeletal:       Per HPI  Skin: Negative.   Allergic/Immunologic: Negative.   Neurological: Positive for tremors.  Hematological: Negative.   Psychiatric/Behavioral: Negative for dysphoric mood and sleep disturbance. The patient is not nervous/anxious.        Objective:   Physical Exam Physical Exam  Constitutional: She is oriented to person, place, and time. She appears well-developed and well-nourished. No distress.  HENT:  Head: Normocephalic and atraumatic.  Right Ear: External ear  normal. TM normal.  Left Ear: External ear normal. TM normal.  Nose: Nose normal.  Mouth/Throat: Oropharynx is clear and moist. No oropharyngeal exudate.  Eyes: Conjunctivae are normal.   Neck: Normal range of motion. Neck supple. No JVD present. No thyromegaly present.  Cardiovascular: Normal rate, regular rhythm, normal heart sounds and intact distal pulses.   Pulmonary/Chest: Effort normal and breath sounds normal. Right breast exhibits no inverted nipple, no mass, no nipple discharge, no skin change and no tenderness. Left breast exhibits no inverted nipple, no mass, no nipple discharge, no skin change and no tenderness. Breasts are symmetrical.  Abdominal: Soft. Bowel sounds are normal. She exhibits no distension and no mass. There is no tenderness. There is no rebound and no guarding.  Musculoskeletal: Normal range of motion. She exhibits no edema or tenderness.  Lymphadenopathy:    She has no cervical adenopathy.  Neurological: She is alert and oriented to person, place, and time.   Skin: Skin is warm and dry. She is not diaphoretic.  Psychiatric: She has a normal mood and affect. Her behavior is normal. Judgment and thought content normal.  Vitals reviewed.     BP (!) 150/100 (BP Location: Right Arm, Patient Position: Sitting, Cuff Size: Normal)   Pulse 79   Temp (!) 97 F (36.1 C) (Temporal)   Ht 6' (1.829 m)   Wt 234 lb 12.8 oz (106.5 kg)   SpO2 97%   BMI 31.84 kg/m      BP Readings from Last 3 Encounters:  02/25/20 (!) 150/100  08/25/19 138/88  04/30/19 128/88  BP: 136/84   Wt Readings from Last 3 Encounters:  02/25/20 234 lb 12.8 oz (106.5 kg)  08/25/19 236 lb (107 kg)  04/30/19 246 lb 12.8 oz (111.9 kg)   Depression screen [facility] 2/9 02/25/2020 08/25/2019 11/28/2017 11/28/2017 05/13/2012  Decreased Interest 0 0 0 0 0  Down, Depressed, Hopeless 0 0 1 1 0  PHQ - 2 Score 0 0 1 1 0  Altered sleeping - - 1 - -  Tired, decreased energy - - 1 - -  Change in appetite - - 0  - -  Feeling bad or failure about yourself  - - 0 - -  Trouble concentrating - - 0 - -  Moving slowly or fidgety/restless - - 0 - -  Suicidal thoughts - - 0 - -  PHQ-9 Score - - 3 - -    Assessment & Plan:  1. Annual physical exam - Discussed and encouraged healthy lifestyle choices- adequate sleep, regular exercise, stress management and healthy food choices.  -Recommended health maintenance, provided number for her to call to schedule mammogram and bone density study  2. Estrogen deficiency - DG Bone Density; Future  3. Numbness and tingling in right hand - Ambulatory referral to Hand Surgery  4. Need for immunization against influenza - Flu Vaccine QUAD High Dose(Fluad)  5. Need for vaccination against Streptococcus pneumoniae using pneumococcal conjugate vaccine 13 - Pneumococcal conjugate vaccine 13-valent IM  6. Controlled type 2 diabetes mellitus without complication, without long-term current use of insulin (HCC) -Improved hemoglobin A1c, at goal, continue Metformin, atorvastatin  7. Essential hypertension -Blood pressure initially high but reading within normal limits with recheck  -Follow-up in 6 months  This visit occurred during the SARS-CoV-2 public health emergency.  Safety protocols were in place, including screening questions prior to the visit, additional usage of staff PPE, and extensive cleaning of exam room while observing appropriate contact time as indicated for disinfecting solutions.      Clarene Reamer, FNP-BC  [facility] at [facility]  02/26/2020 1:35 PM

## 2020-02-26 ENCOUNTER — Telehealth: Payer: Self-pay

## 2020-02-26 NOTE — Telephone Encounter (Signed)
Called [facility] in [place]-patient has outstanding balance with them and they can not schedule until that is discussed. I called [facility] and they do see patients with hand issues. Can upload to proficient to have them schedule.  I called patient to discuss her options but main number is not a working number, left message for patient's husband to have patient call me back.

## 2020-03-01 NOTE — Telephone Encounter (Signed)
Called patient again today and  left message for patient's husband to have patient call me back. Main number in the chart needs to be updated as it no longer is working

## 2020-03-08 NOTE — Telephone Encounter (Signed)
Noted. Thank you for your efforts. Can send letter if needed.

## 2020-03-08 NOTE — Telephone Encounter (Signed)
Called all the numbers in the chart today also and was only able to leave a message on spouse's number.  FYI to PCP-have been trying to reach patient for several days to discuss orthopedic referral/information.

## 2020-03-11 ENCOUNTER — Other Ambulatory Visit: Payer: Self-pay | Admitting: Family Medicine

## 2020-03-11 DIAGNOSIS — E785 Hyperlipidemia, unspecified: Secondary | ICD-10-CM

## 2020-03-11 NOTE — Telephone Encounter (Signed)
Pharmacy requests refill on: Hydrochlorothiazide 25 mg, Atorvastatin 80 mg, Amlodipine 5 mg   LAST REFILL: 09/05/2019 (Q-30, R-5) LAST OV: 02/25/2020 NEXT OV: 08/30/2020 PHARMACY: [facility] [place], [place]

## 2020-04-26 ENCOUNTER — Telehealth: Payer: Self-pay | Admitting: Family Medicine

## 2020-04-26 DIAGNOSIS — E2839 Other primary ovarian failure: Secondary | ICD-10-CM

## 2020-04-26 DIAGNOSIS — Z1231 Encounter for screening mammogram for malignant neoplasm of breast: Secondary | ICD-10-CM

## 2020-04-26 NOTE — Telephone Encounter (Signed)
Patient called. She was a patient of Debbie's. Patient has a transfer of care appointment with Dr.Cody in June for a follow up.  Patient called [facility] to schedule screening mammogram and bone density.  Patient said [facility] won't schedule an appointment until they receive an order for the screening mammogram and bone density.  Please call patient when orders are sent to [facility], so she can schedule the appointment.

## 2020-04-26 NOTE — Telephone Encounter (Signed)
Attempted to call pt. No answer and VM box not set up. Will try again tomorrow.

## 2020-04-26 NOTE — Telephone Encounter (Signed)
Ordered

## 2020-04-28 NOTE — Telephone Encounter (Signed)
Spoke to pt and relayed that the referrals were placed for her mammogram and bone density. Pt appreciative.

## 2020-07-06 ENCOUNTER — Ambulatory Visit
Admission: RE | Admit: 2020-07-06 | Discharge: 2020-07-06 | Disposition: A | Discharge status: Home / Self Care | Payer: Medicare HMO | Source: Ambulatory Visit | Attending: Family Medicine | Admitting: Family Medicine

## 2020-07-06 ENCOUNTER — Other Ambulatory Visit: Payer: Self-pay

## 2020-07-06 DIAGNOSIS — E2839 Other primary ovarian failure: Secondary | ICD-10-CM | POA: Insufficient documentation

## 2020-07-06 DIAGNOSIS — Z1231 Encounter for screening mammogram for malignant neoplasm of breast: Secondary | ICD-10-CM | POA: Insufficient documentation

## 2020-07-06 DIAGNOSIS — M85852 Other specified disorders of bone density and structure, left thigh: Secondary | ICD-10-CM | POA: Diagnosis not present

## 2020-07-07 ENCOUNTER — Encounter: Payer: Self-pay | Admitting: Family Medicine

## 2020-07-12 ENCOUNTER — Ambulatory Visit (LOCAL_COMMUNITY_HEALTH_CENTER): Payer: Medicare HMO

## 2020-07-12 ENCOUNTER — Other Ambulatory Visit: Payer: Self-pay

## 2020-07-12 DIAGNOSIS — Z111 Encounter for screening for respiratory tuberculosis: Secondary | ICD-10-CM

## 2020-07-15 ENCOUNTER — Ambulatory Visit (LOCAL_COMMUNITY_HEALTH_CENTER): Payer: Medicare HMO

## 2020-07-15 ENCOUNTER — Other Ambulatory Visit: Payer: Self-pay

## 2020-07-15 DIAGNOSIS — Z111 Encounter for screening for respiratory tuberculosis: Secondary | ICD-10-CM

## 2020-07-15 LAB — TB SKIN TEST
Induration: 0 mm
TB Skin Test: NEGATIVE

## 2020-08-30 ENCOUNTER — Encounter: Payer: PRIVATE HEALTH INSURANCE | Admitting: Family Medicine

## 2020-09-02 ENCOUNTER — Ambulatory Visit (INDEPENDENT_AMBULATORY_CARE_PROVIDER_SITE_OTHER): Payer: Medicare HMO | Admitting: Family Medicine

## 2020-09-02 ENCOUNTER — Other Ambulatory Visit: Payer: Self-pay

## 2020-09-02 ENCOUNTER — Encounter: Payer: Self-pay | Admitting: Family Medicine

## 2020-09-02 VITALS — BP 130/98 | HR 85 | Temp 97.8°F | Ht 72.0 in | Wt 244.0 lb

## 2020-09-02 DIAGNOSIS — E1140 Type 2 diabetes mellitus with diabetic neuropathy, unspecified: Secondary | ICD-10-CM

## 2020-09-02 DIAGNOSIS — Z853 Personal history of malignant neoplasm of breast: Secondary | ICD-10-CM | POA: Diagnosis not present

## 2020-09-02 DIAGNOSIS — I10 Essential (primary) hypertension: Secondary | ICD-10-CM | POA: Diagnosis not present

## 2020-09-02 DIAGNOSIS — F419 Anxiety disorder, unspecified: Secondary | ICD-10-CM | POA: Diagnosis not present

## 2020-09-02 DIAGNOSIS — E785 Hyperlipidemia, unspecified: Secondary | ICD-10-CM

## 2020-09-02 DIAGNOSIS — E119 Type 2 diabetes mellitus without complications: Secondary | ICD-10-CM

## 2020-09-02 DIAGNOSIS — M1712 Unilateral primary osteoarthritis, left knee: Secondary | ICD-10-CM

## 2020-09-02 DIAGNOSIS — E1169 Type 2 diabetes mellitus with other specified complication: Secondary | ICD-10-CM

## 2020-09-02 MED ORDER — METOPROLOL SUCCINATE ER 100 MG PO TB24: ORAL_TABLET | ORAL | 3 refills | Status: DC

## 2020-09-02 MED ORDER — SERTRALINE HCL 100 MG PO TABS
100.0000 mg | ORAL_TABLET | Freq: Every day | ORAL | 3 refills | Status: DC
Start: 2020-09-02 — End: 2021-12-07

## 2020-09-02 MED ORDER — AMLODIPINE BESYLATE 5 MG PO TABS: 1.0000 | ORAL_TABLET | Freq: Every day | ORAL | 3 refills | Status: DC

## 2020-09-02 MED ORDER — METFORMIN HCL ER 500 MG PO TB24: 500.0000 mg | ORAL_TABLET | Freq: Every day | ORAL | 3 refills | Status: DC

## 2020-09-02 MED ORDER — ATORVASTATIN CALCIUM 80 MG PO TABS
1.0000 | ORAL_TABLET | Freq: Every day | ORAL | 3 refills | Status: DC
Start: 2020-09-02 — End: 2021-12-07

## 2020-09-02 MED ORDER — HYDROCHLOROTHIAZIDE 25 MG PO TABS: 1.0000 | ORAL_TABLET | Freq: Every day | ORAL | 3 refills | Status: DC

## 2020-09-02 NOTE — Assessment & Plan Note (Signed)
Lab Results  Component Value Date   HGBA1C 6.4 02/18/2020   Well controlled on metformin 500 mg daily. Will get urine at next visit. 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 C/b hld, htn. Work on diet/weight loss and recheck in 3 months

## 2020-09-02 NOTE — Assessment & Plan Note (Signed)
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 Voltaren Gel and home exercises. Call if wanting to try PT or return for potential injection

## 2020-09-02 NOTE — Patient Instructions (Addendum)
#  Diabetes - continue metformin - when you get your eye exam make sure to tell them you need a diabetic eye exam and send the records to [place]  Work on low carbohydrate diet  #Knee pain - try voltaren Gel  - exercise handout - if not better can consider physical therapy referral (call for this) or steroid injection (schedule with me or Dr. Lorelei Pont)

## 2020-09-02 NOTE — Assessment & Plan Note (Signed)
Controlled on sertraline 100 mg

## 2020-09-02 NOTE — Assessment & Plan Note (Signed)
Lab Results  Component Value Date   CHOL 155 02/18/2020   HDL 49.40 02/18/2020   LDLCALC 87 02/18/2020   LDLDIRECT 173.0 08/29/2017   TRIG 95.0 02/18/2020   CHOLHDL 3 02/18/2020  LDL goal <70. Cont atorvastatin 80 mg. Work on diet/exercise. Repeat labs at next visit

## 2020-09-02 NOTE — Progress Notes (Signed)
Subjective:     Olivia Kirk is a 65 y.o. female presenting for Transitions Of Care     HPI  #Knee pain - left knee - will also get pain on the right side - has a pillow for nighttime which helps her back - placing between her knees - symptoms for at least 1 year - treatment: saw a vein specialist b/c she thought was related - tried compression socks w/o improvement in pain  - triggers - no clear triggers - will occur when sitting or wake her up from sleep - treatment: pillow, elevate the feet - not necessarily worse with standing or improved with rest - no buckling - has locked 1-2 times    #HTN - does not check bp at home - forgets medication 1-2 times a week - taking amlodipine 5 mg, hctz 25 mg - not taking metoprolol 100 mg daily - limit salty meats  #Diabetes - taking metformin   Review of Systems   Social History   Tobacco Use  Smoking Status Former   Packs/day: 0.25   Years: 6.00   Pack years: 1.50   Types: Cigarettes   Quit date: 05/07/1987   Years since quitting: 33.3  Smokeless Tobacco Never        Objective:    BP Readings from Last 3 Encounters:  09/02/20 (!) 130/98  02/25/20 136/84  08/25/19 138/88   Wt Readings from Last 3 Encounters:  09/02/20 244 lb (110.7 kg)  02/25/20 234 lb 12.8 oz (106.5 kg)  08/25/19 236 lb (107 kg)    BP (!) 130/98   Pulse 85   Temp 97.8 F (36.6 C) (Temporal)   Ht 6' (1.829 m)   Wt 244 lb (110.7 kg)   SpO2 98%   BMI 33.09 kg/m    Physical Exam Constitutional:      General: She is not in acute distress.    Appearance: She is well-developed. She is not diaphoretic.  HENT:     Right Ear: External ear normal.     Left Ear: External ear normal.     Nose: Nose normal.  Eyes:     Conjunctiva/sclera: Conjunctivae normal.  Cardiovascular:     Rate and Rhythm: Normal rate.  Pulmonary:     Effort: Pulmonary effort is normal.  Musculoskeletal:     Cervical back: Neck supple.     Comments:  Left knee Inspection: no erythema, mild edema Palpation: ttp along medial jointline and with patella movement ROM: normal  Strength: normal Ligaments: intact  Skin:    General: Skin is warm and dry.     Capillary Refill: Capillary refill takes less than 2 seconds.  Neurological:     Mental Status: She is alert. Mental status is at baseline.  Psychiatric:        Mood and Affect: Mood normal.        Behavior: Behavior normal.          Assessment & Plan:   Problem List Items Addressed This Visit       Cardiovascular and Mediastinum   Essential hypertension - Primary    BP elevated in setting of stopping metoprolol. Restart metoprolol 100 mg. Con amlodipine 5 mg and HCTZ 25 mg. She would benefit from ACE-I may discuss at next visit.        Relevant Medications   amLODipine (NORVASC) 5 MG tablet   atorvastatin (LIPITOR) 80 MG tablet   hydrochlorothiazide (HYDRODIURIL) 25 MG tablet   metoprolol succinate (TOPROL-XL)  100 MG 24 hr tablet     Endocrine   Type 2 diabetes mellitus with other specified complication ([place])    Lab Results  Component Value Date   HGBA1C 6.4 02/18/2020  Well controlled on metformin 500 mg daily. Will get urine at next visit. 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 C/b hld, htn. Work on diet/weight loss and recheck in 3 months       Relevant Medications   atorvastatin (LIPITOR) 80 MG tablet   metFORMIN (GLUCOPHAGE XR) 500 MG 24 hr tablet   Type 2 diabetes mellitus with diabetic neuropathy, unspecified (HCC)    Mild decreased testing. Will work on diabetes control and monitor - encouraged regular skin checks.        Relevant Medications   atorvastatin (LIPITOR) 80 MG tablet   metFORMIN (GLUCOPHAGE XR) 500 MG 24 hr tablet     Musculoskeletal and Integument   Arthritis of left knee    Suspect medial joint arthritis. Voltaren Gel and home exercises. Call if wanting to try PT or return for potential injection         Other   HLD  (hyperlipidemia)    Lab Results  Component Value Date   CHOL 155 02/18/2020   HDL 49.40 02/18/2020   LDLCALC 87 02/18/2020   LDLDIRECT 173.0 08/29/2017   TRIG 95.0 02/18/2020   CHOLHDL 3 02/18/2020  LDL goal <70. Cont atorvastatin 80 mg. Work on diet/exercise. Repeat labs at next visit        Relevant Medications   amLODipine (NORVASC) 5 MG tablet   atorvastatin (LIPITOR) 80 MG tablet   hydrochlorothiazide (HYDRODIURIL) 25 MG tablet   metoprolol succinate (TOPROL-XL) 100 MG 24 hr tablet   History of breast cancer    Remission. Cont annual mammogram       Anxiety    Controlled on sertraline 100 mg       Relevant Medications   sertraline (ZOLOFT) 100 MG tablet   Other Visit Diagnoses     Controlled type 2 diabetes mellitus without complication, without long-term current use of insulin (HCC)       Relevant Medications   atorvastatin (LIPITOR) 80 MG tablet   metFORMIN (GLUCOPHAGE XR) 500 MG 24 hr tablet        Return in about 3 months (around 12/03/2020) for blood pressure.  Lesleigh Noe, MD  This visit occurred during the SARS-CoV-2 public health emergency.  Safety protocols were in place, including screening questions prior to the visit, additional usage of staff PPE, and extensive cleaning of exam room while observing appropriate contact time as indicated for disinfecting solutions.

## 2020-09-02 NOTE — Assessment & Plan Note (Signed)
Remission. Cont annual mammogram

## 2020-09-02 NOTE — Assessment & Plan Note (Signed)
BP elevated in setting of stopping metoprolol. Restart metoprolol 100 mg. Con amlodipine 5 mg and HCTZ 25 mg. She would benefit from ACE-I may discuss at next visit.

## 2020-09-02 NOTE — Assessment & Plan Note (Signed)
Mild decreased testing. Will work on diabetes control and monitor - encouraged regular skin checks.

## 2020-12-06 ENCOUNTER — Ambulatory Visit (INDEPENDENT_AMBULATORY_CARE_PROVIDER_SITE_OTHER): Payer: Medicare HMO | Admitting: Family Medicine

## 2020-12-06 ENCOUNTER — Other Ambulatory Visit: Payer: Self-pay

## 2020-12-06 VITALS — BP 112/70 | HR 67 | Temp 97.0°F | Ht 72.0 in | Wt 239.5 lb

## 2020-12-06 DIAGNOSIS — E1140 Type 2 diabetes mellitus with diabetic neuropathy, unspecified: Secondary | ICD-10-CM

## 2020-12-06 DIAGNOSIS — D649 Anemia, unspecified: Secondary | ICD-10-CM | POA: Diagnosis not present

## 2020-12-06 DIAGNOSIS — I10 Essential (primary) hypertension: Secondary | ICD-10-CM | POA: Diagnosis not present

## 2020-12-06 DIAGNOSIS — M545 Low back pain, unspecified: Secondary | ICD-10-CM

## 2020-12-06 DIAGNOSIS — M25562 Pain in left knee: Secondary | ICD-10-CM | POA: Insufficient documentation

## 2020-12-06 DIAGNOSIS — E782 Mixed hyperlipidemia: Secondary | ICD-10-CM

## 2020-12-06 DIAGNOSIS — G8929 Other chronic pain: Secondary | ICD-10-CM

## 2020-12-06 DIAGNOSIS — M25561 Pain in right knee: Secondary | ICD-10-CM | POA: Diagnosis not present

## 2020-12-06 DIAGNOSIS — E119 Type 2 diabetes mellitus without complications: Secondary | ICD-10-CM

## 2020-12-06 DIAGNOSIS — Z23 Encounter for immunization: Secondary | ICD-10-CM | POA: Diagnosis not present

## 2020-12-06 DIAGNOSIS — R21 Rash and other nonspecific skin eruption: Secondary | ICD-10-CM

## 2020-12-06 LAB — POCT GLYCOSYLATED HEMOGLOBIN (HGB A1C): Hemoglobin A1C: 6.3 % — AB (ref 4.0–5.6)

## 2020-12-06 MED ORDER — TRIAMCINOLONE ACETONIDE 0.5 % EX OINT: 1.0000 "application " | TOPICAL_OINTMENT | Freq: Two times a day (BID) | CUTANEOUS | 0 refills | Status: DC

## 2020-12-06 NOTE — Patient Instructions (Addendum)
Back and knee pain - try stationary bike - referral to physical therapy  #Referral I have placed a referral to a specialist for you. You should receive a phone call from the specialty office. Make sure your voicemail is not full and that if you are able to answer your phone to unknown or new numbers.   It may take up to 2 weeks to hear about the referral. If you do not hear anything in 2 weeks, please call our office and ask to speak with the referral coordinator.    #skin rash - triamcinolone cream  - update if no improvement and will consider dermatology  Controlling diabetes is important for improving your overall health.   Lab Results  Component Value Date   HGBA1C 6.3 (A) 12/06/2020    The ideal Hemoglobin A1c is <7%   Diabetes can lead to: Kidney disease, vision issues, high blood pressure, infection.   How can you treat your diabetes 1) Intensive Lifestyle Program - [facility]  2) Regular exercise - 30 minutes of moderate activity, 5 times a week 3) Weight loss - only 7% 4) Modifying your diet - limit food high in sugar - go to [email or web address] to learn more 5) Medication 6) Consider Bariatric Surgery if candidate   Every Year you should have the following:  1) Foot exam 2) Special diabetes eye exam - send [place] the result when you get this done 3) Urine test to look for signs of kidney disease   If you have kidney disease related to diabetes this is how we can treat it 1) Control your diabetes - first with metformin if tolerated 2) If you have high blood pressure - Take a medication that is an ACE inhibitor or ARB (like lisinopril or losartan)

## 2020-12-06 NOTE — Assessment & Plan Note (Signed)
Suspect dermatitis. Trial of triamcinolone. If no change will consider derm referral

## 2020-12-06 NOTE — Assessment & Plan Note (Signed)
Lab Results  Component Value Date   HGBA1C 6.3 (A) 12/06/2020   Good control. Cont metformin. Explained importance of annual eye exam. Return 1 year or sooner.

## 2020-12-06 NOTE — Assessment & Plan Note (Signed)
BP at goal. Cont amlodipine 5 mg, hctz 25 mg, and metoprolol 100 mg. Labs today

## 2020-12-06 NOTE — Assessment & Plan Note (Signed)
Lab Results  Component Value Date   LDLCALC 87 02/18/2020   Repeat today. Already on atorvastatin 80 mg. Goal <70 for diabetes

## 2020-12-06 NOTE — Progress Notes (Signed)
Subjective:     Olivia Kirk is a 65 y.o. female presenting for Follow-up (3 mo- BP and DM )     HPI   #HTN - doing well - taking her medication - no cp, sob, ha, vision changes - no issues  #Knee pain - bilateral knee pain - sometimes so severe she cannot sleep - icy/hot - no improvement - voltaren gel w/o improvement - taken tylenol/aleve works for a little while and comes back - hard to get up and move around - has not tried physical therapy - pain off and on for 2 years - no hx of imaging done  #Diabetes Currently taking metformin (Glucophage, [place])  Using medications without difficulties: Yes Hypoglycemic episodes: does not check Hyperglycemic episodes: does not check Feet problems:No  Blood Sugars averaging: does not check Last HgbA1c:  Lab Results  Component Value Date   HGBA1C 6.3 (A) 12/06/2020    Diabetes Health Maintenance Due:    Diabetes Health Maintenance Due  Topic Date Due   OPHTHALMOLOGY EXAM  Never done   URINE MICROALBUMIN  09/02/2020   HEMOGLOBIN A1C  06/05/2021   FOOT EXAM  09/02/2021   Following diet  #Rash - bilateral arm pits - itchy - avoiding deodorant  - occurred after shaving   Review of Systems  09/02/2020: Clinic - HTN - restart metoprolol 100 mg and cont amlodipine/hctz  Social History   Tobacco Use  Smoking Status Former   Packs/day: 0.25   Years: 6.00   Pack years: 1.50   Types: Cigarettes   Quit date: 05/07/1987   Years since quitting: 33.6  Smokeless Tobacco Never        Objective:    BP Readings from Last 3 Encounters:  12/06/20 112/70  09/02/20 (!) 130/98  02/25/20 136/84   Wt Readings from Last 3 Encounters:  12/06/20 239 lb 8 oz (108.6 kg)  09/02/20 244 lb (110.7 kg)  02/25/20 234 lb 12.8 oz (106.5 kg)    BP 112/70   Pulse 67   Temp (!) 97 F (36.1 C) (Temporal)   Ht 6' (1.829 m)   Wt 239 lb 8 oz (108.6 kg)   SpO2 97%   BMI 32.48 kg/m    Physical Exam Constitutional:       General: She is not in acute distress.    Appearance: She is well-developed. She is not diaphoretic.  HENT:     Right Ear: External ear normal.     Left Ear: External ear normal.  Eyes:     Conjunctiva/sclera: Conjunctivae normal.  Cardiovascular:     Rate and Rhythm: Normal rate and regular rhythm.     Heart sounds: No murmur heard. Pulmonary:     Effort: Pulmonary effort is normal. No respiratory distress.     Breath sounds: Normal breath sounds. No wheezing.  Musculoskeletal:     Cervical back: Neck supple.     Comments: Medial joint line tenderness on bilateral knees.   Skin:    General: Skin is warm and dry.     Capillary Refill: Capillary refill takes less than 2 seconds.     Comments: Macular papular black to brown rash in bilateral axilla  Neurological:     Mental Status: She is alert. Mental status is at baseline.  Psychiatric:        Mood and Affect: Mood normal.        Behavior: Behavior normal.          Assessment & Plan:  Problem List Items Addressed This Visit       Cardiovascular and Mediastinum   Essential hypertension    BP at goal. Cont amlodipine 5 mg, hctz 25 mg, and metoprolol 100 mg. Labs today      Relevant Orders   Comprehensive metabolic panel     Endocrine   Type 2 diabetes mellitus with diabetic neuropathy, unspecified ([place])    Lab Results  Component Value Date   HGBA1C 6.3 (A) 12/06/2020  Good control. Cont metformin. Explained importance of annual eye exam. Return 1 year or sooner.         Musculoskeletal and Integument   Rash and nonspecific skin eruption    Suspect dermatitis. Trial of triamcinolone. If no change will consider derm referral      Relevant Medications   triamcinolone ointment (KENALOG) 0.5 %     Other   HLD (hyperlipidemia)    Lab Results  Component Value Date   LDLCALC 87 02/18/2020  Repeat today. Already on atorvastatin 80 mg. Goal <70 for diabetes       Relevant Orders   Lipid panel    Chronic pain of both knees    Suspect arthritis. Advise PT referral and stationary bike.       Relevant Orders   Ambulatory referral to Physical Therapy   Other Visit Diagnoses     Controlled type 2 diabetes mellitus without complication, without long-term current use of insulin ([place])    -  Primary   Relevant Orders   POCT glycosylated hemoglobin (Hb A1C) (Completed)   Microalbumin / creatinine urine ratio   Need for influenza vaccination       Relevant Orders   Flu Vaccine QUAD High Dose(Fluad)   Anemia, unspecified type       Relevant Orders   CBC   Chronic bilateral low back pain without sciatica       Relevant Orders   Ambulatory referral to Physical Therapy        Return in about 1 year (around 12/06/2021) for annual visit.  Lesleigh Noe, MD  This visit occurred during the SARS-CoV-2 public health emergency.  Safety protocols were in place, including screening questions prior to the visit, additional usage of staff PPE, and extensive cleaning of exam room while observing appropriate contact time as indicated for disinfecting solutions.

## 2020-12-06 NOTE — Assessment & Plan Note (Signed)
Suspect arthritis. Advise PT referral and stationary bike.

## 2020-12-07 LAB — LIPID PANEL
Cholesterol: 222 mg/dL — ABNORMAL HIGH (ref 0–200)
HDL: 42.8 mg/dL (ref >39.00)
NonHDL: 178.96
Total CHOL/HDL Ratio: 5
Triglycerides: 220 mg/dL — ABNORMAL HIGH (ref 0.0–149.0)
VLDL: 44 mg/dL — ABNORMAL HIGH (ref 0.0–40.0)

## 2020-12-07 LAB — COMPREHENSIVE METABOLIC PANEL
ALT: 14 U/L (ref 0–35)
AST: 18 U/L (ref 0–37)
Albumin: 4.5 g/dL (ref 3.5–5.2)
Alkaline Phosphatase: 62 U/L (ref 39–117)
BUN: 18 mg/dL (ref 6–23)
CO2: 29 mEq/L (ref 19–32)
Calcium: 10.3 mg/dL (ref 8.4–10.5)
Chloride: 103 mEq/L (ref 96–112)
Creatinine, Ser: 1.02 mg/dL (ref 0.40–1.20)
GFR: 57.53 mL/min — ABNORMAL LOW (ref >60.00)
Glucose, Bld: 142 mg/dL — ABNORMAL HIGH (ref 70–99)
Potassium: 3.6 mEq/L (ref 3.5–5.1)
Sodium: 140 mEq/L (ref 135–145)
Total Bilirubin: 0.4 mg/dL (ref 0.2–1.2)
Total Protein: 7.4 g/dL (ref 6.0–8.3)

## 2020-12-07 LAB — CBC
HCT: 36.8 % (ref 36.0–46.0)
Hemoglobin: 11.9 g/dL — ABNORMAL LOW (ref 12.0–15.0)
MCHC: 32.4 g/dL (ref 30.0–36.0)
MCV: 81.4 fl (ref 78.0–100.0)
Platelets: 299 10*3/uL (ref 150.0–400.0)
RBC: 4.52 Mil/uL (ref 3.87–5.11)
RDW: 14 % (ref 11.5–15.5)
WBC: 6.7 10*3/uL (ref 4.0–10.5)

## 2020-12-07 LAB — LDL CHOLESTEROL, DIRECT: Direct LDL: 130 mg/dL

## 2020-12-07 LAB — MICROALBUMIN / CREATININE URINE RATIO
Creatinine,U: 160.8 mg/dL
Microalb Creat Ratio: 1 mg/g (ref 0.0–30.0)
Microalb, Ur: 1.7 mg/dL (ref 0.0–1.9)

## 2021-06-22 DIAGNOSIS — M170 Bilateral primary osteoarthritis of knee: Secondary | ICD-10-CM | POA: Diagnosis not present

## 2021-08-03 DIAGNOSIS — Z111 Encounter for screening for respiratory tuberculosis: Secondary | ICD-10-CM | POA: Diagnosis not present

## 2021-08-05 DIAGNOSIS — Z111 Encounter for screening for respiratory tuberculosis: Secondary | ICD-10-CM | POA: Diagnosis not present

## 2021-08-12 ENCOUNTER — Other Ambulatory Visit: Payer: Self-pay | Admitting: Family Medicine

## 2021-08-18 ENCOUNTER — Ambulatory Visit (INDEPENDENT_AMBULATORY_CARE_PROVIDER_SITE_OTHER): Payer: Medicare HMO | Admitting: Family Medicine

## 2021-08-18 VITALS — BP 138/88 | HR 62 | Temp 97.6°F | Wt 246.5 lb

## 2021-08-18 DIAGNOSIS — M7989 Other specified soft tissue disorders: Secondary | ICD-10-CM | POA: Diagnosis not present

## 2021-08-18 DIAGNOSIS — I10 Essential (primary) hypertension: Secondary | ICD-10-CM

## 2021-08-18 DIAGNOSIS — M5412 Radiculopathy, cervical region: Secondary | ICD-10-CM | POA: Diagnosis not present

## 2021-08-18 DIAGNOSIS — Z8261 Family history of arthritis: Secondary | ICD-10-CM

## 2021-08-18 DIAGNOSIS — M25512 Pain in left shoulder: Secondary | ICD-10-CM | POA: Diagnosis not present

## 2021-08-18 LAB — COMPREHENSIVE METABOLIC PANEL
ALT: 10 U/L (ref 0–35)
AST: 13 U/L (ref 0–37)
Albumin: 4.3 g/dL (ref 3.5–5.2)
Alkaline Phosphatase: 63 U/L (ref 39–117)
BUN: 15 mg/dL (ref 6–23)
CO2: 30 mEq/L (ref 19–32)
Calcium: 9.9 mg/dL (ref 8.4–10.5)
Chloride: 102 mEq/L (ref 96–112)
Creatinine, Ser: 0.92 mg/dL (ref 0.40–1.20)
GFR: 64.8 mL/min (ref >60.00)
Glucose, Bld: 95 mg/dL (ref 70–99)
Potassium: 3.7 mEq/L (ref 3.5–5.1)
Sodium: 139 mEq/L (ref 135–145)
Total Bilirubin: 0.5 mg/dL (ref 0.2–1.2)
Total Protein: 7.2 g/dL (ref 6.0–8.3)

## 2021-08-18 LAB — BRAIN NATRIURETIC PEPTIDE: Pro B Natriuretic peptide (BNP): 63 pg/mL (ref 0.0–100.0)

## 2021-08-18 MED ORDER — PREDNISONE 20 MG PO TABS: ORAL_TABLET | ORAL | 0 refills | Status: AC

## 2021-08-18 NOTE — Assessment & Plan Note (Signed)
Spurling is positive. Discussed trial of steroids. Offered PT, she will consider if no improvement. Strength normal.

## 2021-08-18 NOTE — Assessment & Plan Note (Signed)
Etiology unclear. She does not have joint pain but with family hx of RA will check ANA and RF. Will also get BNP to evaluate heart given some intermittent dyspnea (though no BNP or orthopnea) and ankle swelling. If work-up negative suspect it may be 2/2 to amlodipine. Will consider trial off this.

## 2021-08-18 NOTE — Progress Notes (Signed)
Subjective:     Olivia Kirk is a 66 y.o. female presenting for Shoulder Pain (L "tingling and numb" comes down into arm and into neck off and on) and Joint Swelling (Both wrists and fingers in both hands )     Shoulder Pain    #wrist swelling - bilateral - x 1 month - hands and fingers too - unable to remove the rings - no pain - some tingling and numbness - occasionally and the entire hand - has not noticed anything that makes it better/worse - swelling comes and goes - notices it more in the evenings - morning typically better - when she puts on watch/bracelet in the morning it is easier than removing - no new meds - no increase in salt - some ankle swelling  #Left shoulder - started 1 month ago - come and goes - pain in the shoulder - sore "like a shot"  - pain in the side of the shoulder and the back - tingling radiates to the front and up the neck and towards the back of the neck - tingling does not radiate down the arm - worse with doing someone else's hair  - has not tried medication   Takes meloxicam daily for knee pain  Works 6 days a week   Review of Systems   Social History   Tobacco Use  Smoking Status Former   Packs/day: 0.25   Years: 6.00   Pack years: 1.50   Types: Cigarettes   Quit date: 05/07/1987   Years since quitting: 34.3  Smokeless Tobacco Never        Objective:    BP Readings from Last 3 Encounters:  08/18/21 138/88  12/06/20 112/70  09/02/20 (!) 130/98   Wt Readings from Last 3 Encounters:  08/18/21 246 lb 8 oz (111.8 kg)  12/06/20 239 lb 8 oz (108.6 kg)  09/02/20 244 lb (110.7 kg)    BP 138/88   Pulse 62   Temp 97.6 F (36.4 C) (Temporal)   Wt 246 lb 8 oz (111.8 kg)   SpO2 95%   BMI 33.43 kg/m    Physical Exam Constitutional:      General: She is not in acute distress.    Appearance: She is well-developed. She is not diaphoretic.  HENT:     Right Ear: External ear normal.     Left Ear: External  ear normal.     Nose: Nose normal.  Eyes:     Conjunctiva/sclera: Conjunctivae normal.  Cardiovascular:     Rate and Rhythm: Normal rate.  Pulmonary:     Effort: Pulmonary effort is normal.  Musculoskeletal:     Cervical back: Neck supple.     Comments: Bilateral Hands Inspection: swelling of the wrist, fingers, hands - no erythema Palpation: no ttp along the wrist, fingers, hand ROM: normal w/o pain Strength: normal  Neck Inspection: kyphosis Palpation: some left lateral ttp and along the trapezius, no spinous ttp ROM: normal Spurling: positive - with tingling on the left side   Shoulder Inspection: no abnormalities ROM: normal Strength: normal Palpation: ttp along the trapezius, the anterior bicep, otherwise no bony ttp   Skin:    General: Skin is warm and dry.     Capillary Refill: Capillary refill takes less than 2 seconds.  Neurological:     Mental Status: She is alert. Mental status is at baseline.  Psychiatric:        Mood and Affect: Mood normal.  Behavior: Behavior normal.          Assessment & Plan:   Problem List Items Addressed This Visit       Cardiovascular and Mediastinum   Essential hypertension    BP at goal, but borderline. Cont amlodipine 5 mg, hctz 25 mg, and metoprolol 100 mg daily. If no other cause for swelling anticipate stopping amlodipine. She will do home monitoring and update. Not currently on ace/arb so would start lisinopril if bp elevated.          Other   Cervical radicular pain    Spurling is positive. Discussed trial of steroids. Offered PT, she will consider if no improvement. Strength normal.        Relevant Medications   predniSONE (DELTASONE) 20 MG tablet   Bilateral hand swelling - Primary    Etiology unclear. She does not have joint pain but with family hx of RA will check ANA and RF. Will also get BNP to evaluate heart given some intermittent dyspnea (though no BNP or orthopnea) and ankle swelling. If  work-up negative suspect it may be 2/2 to amlodipine. Will consider trial off this.        Relevant Orders   ANA w/Reflex   Rheumatoid factor   Comprehensive metabolic panel   Brain natriuretic peptide   Acute pain of left shoulder    May be 2/2 to neck pain vs trapezius and bicep inflammation. Steroids for neck pathology. Hand out for neck stretches to help with trapezius. If no improvement physical therapy and or imaging.        Other Visit Diagnoses     Family history of rheumatoid arthritis       Relevant Orders   ANA w/Reflex   Rheumatoid factor        Return in about 6 weeks (around 09/29/2021) for if not improving.  Lesleigh Noe, MD

## 2021-08-18 NOTE — Assessment & Plan Note (Signed)
May be 2/2 to neck pain vs trapezius and bicep inflammation. Steroids for neck pathology. Hand out for neck stretches to help with trapezius. If no improvement physical therapy and or imaging.

## 2021-08-18 NOTE — Patient Instructions (Signed)
Neck pain/Tingling - Steroids for 9 days - if no improvement - physical therapy - hand out for exercises - No meloxicam or ibuprofen or aleve while on steroid - tylenol ok for additional pain  Hand swelling - might be medication - amlodipine - if blood work normal - would recommend stopping to see if swelling improves - monitor blood pressure over the next week   Update with home blood pressure readings  Check blood pressure before taking steroid  Please check your blood pressure 2-4 times a week.   To check your blood pressure 1) Sit in a quiet and relaxed place for 5 minutes 2) Make sure your feet are flat on the ground 3) Consider checking first thing in the morning   Normal blood pressure is less than 140/90 Ideally you blood pressure should be around 120/80  Other ways you can reduce your blood pressure:  1) Regular exercise -- Try to get 150 minutes (30 minutes, 5 days a week) of moderate to vigorous aerobic excercise -- Examples: brisk walking (2.5 miles per hour), water aerobics, dancing, gardening, tennis, biking slower than 10 miles per hour 2) DASH Diet - low fat meats, more fresh fruits and vegetables, whole grains, low salt 3) Quit smoking if you smoke 4) Loose 5-10% of your body weight

## 2021-08-18 NOTE — Assessment & Plan Note (Signed)
BP at goal, but borderline. Cont amlodipine 5 mg, hctz 25 mg, and metoprolol 100 mg daily. If no other cause for swelling anticipate stopping amlodipine. She will do home monitoring and update. Not currently on ace/arb so would start lisinopril if bp elevated.

## 2021-08-19 LAB — ANA W/REFLEX: Anti Nuclear Antibody (ANA): NEGATIVE

## 2021-08-19 LAB — RHEUMATOID FACTOR: Rheumatoid fact SerPl-aCnc: <14 IU/mL (ref <14)

## 2021-10-04 ENCOUNTER — Telehealth: Payer: Self-pay | Admitting: Family Medicine

## 2021-10-04 NOTE — Telephone Encounter (Signed)
Tried calling patient to schedule Medicare Annual Wellness Visit (AWV) either virtually or phone  No answer    awvi 11/18/20 per [facility]    This should be a 45 minute visit.  I left my direct # [phone]

## 2021-11-18 ENCOUNTER — Ambulatory Visit (INDEPENDENT_AMBULATORY_CARE_PROVIDER_SITE_OTHER)
Admission: RE | Admit: 2021-11-18 | Discharge: 2021-11-18 | Disposition: A | Discharge status: Home / Self Care | Payer: Medicare HMO | Source: Ambulatory Visit | Attending: Family | Admitting: Family

## 2021-11-18 ENCOUNTER — Encounter: Payer: Self-pay | Admitting: Family

## 2021-11-18 ENCOUNTER — Ambulatory Visit (INDEPENDENT_AMBULATORY_CARE_PROVIDER_SITE_OTHER): Payer: Medicare HMO | Admitting: Family

## 2021-11-18 ENCOUNTER — Other Ambulatory Visit: Payer: Self-pay | Admitting: Family

## 2021-11-18 VITALS — BP 132/76 | HR 61 | Temp 98.6°F | Resp 16 | Ht 72.0 in | Wt 250.0 lb

## 2021-11-18 DIAGNOSIS — M25561 Pain in right knee: Secondary | ICD-10-CM

## 2021-11-18 DIAGNOSIS — M25562 Pain in left knee: Secondary | ICD-10-CM

## 2021-11-18 DIAGNOSIS — G8929 Other chronic pain: Secondary | ICD-10-CM

## 2021-11-18 NOTE — Progress Notes (Signed)
X-ray of the knee did show arthritis and buildup of calcium on the bones which can cause irritation on the joints.  I do suggest that you follow-up with orthopedist at this point because you may need steroid injections to help with the pain

## 2021-11-18 NOTE — Patient Instructions (Addendum)
Complete xray(s) prior to leaving today. I will notify you of your results once received.  Recommend voltaren gel.   A referral was placed today for orthopedist Please let us know if you have not heard back within 2 weeks about the referral.   Due to recent changes in healthcare laws, you may see results of your imaging and/or laboratory studies on MyChart before I have had a chance to review them.  I understand that in some cases there may be results that are confusing or concerning to you. Please understand that not all results are received at the same time and often I may need to interpret multiple results in order to provide you with the best plan of care or course of treatment. Therefore, I ask that you please give me 2 business days to thoroughly review all your results before contacting my office for clarification. Should we see a critical lab result, you will be contacted sooner.   It was a pleasure seeing you today! Please do not hesitate to reach out with any questions and or concerns.  Regards,   Eugenia Pancoast FNP-C

## 2021-11-18 NOTE — Progress Notes (Signed)
Established Patient Office Visit  Subjective:  Patient ID: Olivia Kirk, female    DOB: May 05, 1955  Age: 66 y.o. MRN: [id number]  CC:  Chief Complaint  Patient presents with   Knee Pain    Both knee X years been getting worse over the last few years.    HPI Olivia Kirk is here today with concerns.   Over the last few years with bil knee pain, not necessarily worsening but complains about it often and sometimes can not even sleep.   Has worn knee sleeves, braces, voltaren gel, ibuprofen and tylenol prn but only temporary relief.   Walks with limp, lower back will cause her pain as well from weakness in knees.  Has twisted her left knee at one point years ago while dancing.   She is on atorvastatin.   Past Medical History:  Diagnosis Date   Anemia    H/O   Arthritis    FINGER MIDDLE FINGER RIGHT HAND, LEFT KNEE   Breast cancer (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   GERD (gastroesophageal reflux disease)    RARE   Hyperlipidemia    Hypertension    Personal history of radiation therapy 2018   left breast cancer    Past Surgical History:  Procedure Laterality Date   BREAST BIOPSY Left 06/06/2016   left stereo. invasive mammary carcinoma   BREAST LUMPECTOMY Left 07/03/2016   invasive mammary carcinoma   BREAST LUMPECTOMY WITH NEEDLE LOCALIZATION Left 07/03/2016   Procedure: BREAST LUMPECTOMY WITH NEEDLE LOCALIZATION;  Surgeon: Robert Bellow, MD;  Location: [facility] ORS;  Service: General;  Laterality: Left;   BREAST LUMPECTOMY WITH SENTINEL LYMPH NODE BIOPSY Left 07/03/2016   Procedure: BREAST LUMPECTOMY WITH SENTINEL LYMPH NODE BX;  Surgeon: Robert Bellow, MD;  Location: [facility];  Service: General;  Laterality: Left;   hemrrhoid     TUBAL LIGATION      Family History  Problem Relation Age of Onset   Hypertension Mother    Cancer Father        unknown type   Heart disease  Father    Breast cancer Paternal Aunt 88   Hypertension Maternal Grandmother    Stroke Maternal Grandmother     Social History   Socioeconomic History   Marital status: Married    Spouse name: [name]   Number of children: 4   Years of education: 12   Highest education level: Not on file  Occupational History   Occupation: Medical laboratory scientific officer: [employer]    Comment: [employer] in [employer]   Occupation: Cleaning services    Comment: [employer]  Tobacco Use   Smoking status: Former    Packs/day: 0.25    Years: 6.00    Total pack years: 1.50    Types: Cigarettes    Quit date: 05/07/1987    Years since quitting: 34.5   Smokeless tobacco: Never  Vaping Use   Vaping Use: Never used  Substance and Sexual Activity   Alcohol use: Not Currently   Drug use: No   Sexual activity: Not Currently  Other Topics Concern   Not on file  Social History Narrative   09/02/20   From: [place] originally   Living: with Donnie, husband (1982) and Daughter and 2 grandchildren and son on the property   Work: retired, working as caregiver      Family: 4  children - [name], twins - Shante and Franco Nones - 4 grandchildren       Enjoys: watch movies, go to church      Exercise: not currently   Diet: diabetic diet, healthy      Safety   Seat belts: Yes    Guns: no    Safe in relationships: Yes    '   Social Determinants of Health   Financial Resource Strain: Not on file  Food Insecurity: Not on file  Transportation Needs: Not on file  Physical Activity: Not on file  Stress: Not on file  Social Connections: Not on file  Intimate Partner Violence: Not on file    Outpatient Medications Prior to Visit  Medication Sig Dispense Refill   amLODipine (NORVASC) 5 MG tablet Take 1 tablet (5 mg total) by mouth daily. 90 tablet 3   atorvastatin (LIPITOR) 80 MG tablet Take 1 tablet (80 mg total) by mouth daily. 90 tablet 3   Bioflavonoid Products (BIOFLEX PO) Take by mouth  daily.     Biotin 1000 MCG tablet Take by mouth daily.     Calcium Carbonate (CALCIUM 600 PO) Take by mouth daily.     Cholecalciferol (VITAMIN D3) 1000 units CAPS Take by mouth.     docusate sodium (COLACE) 100 MG capsule Take 100 mg by mouth daily as needed for mild constipation.     hydrochlorothiazide (HYDRODIURIL) 25 MG tablet Take 1 tablet (25 mg total) by mouth daily. 90 tablet 3   meloxicam (MOBIC) 15 MG tablet Take 15 mg by mouth daily.     metFORMIN (GLUCOPHAGE XR) 500 MG 24 hr tablet Take 1 tablet (500 mg total) by mouth daily with breakfast. 90 tablet 3   metoprolol succinate (TOPROL-XL) 100 MG 24 hr tablet Take with or immediately following a meal. 90 tablet 3   Multiple Vitamins-Minerals (ALIVE WOMENS 50+ PO) Take by mouth daily.     Omega-3 Fatty Acids (FISH OIL) 1000 MG CAPS Take by mouth daily.     sertraline (ZOLOFT) 100 MG tablet Take 1 tablet (100 mg total) by mouth daily. 90 tablet 3   triamcinolone ointment (KENALOG) 0.5 % Apply 1 application topically 2 (two) times daily. 30 g 0   vitamin B-12 (CYANOCOBALAMIN) 1000 MCG tablet Take 1,000 mcg by mouth daily.     No facility-administered medications prior to visit.    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    Appearance: Normal appearance. She is obese. She is not ill-appearing, toxic-appearing or diaphoretic.  Pulmonary:     Effort: Pulmonary effort is normal.  Musculoskeletal:     Right knee: Swelling (medial aspect with tenderness on palpation with slight effusiond) present. Normal range of motion. Tenderness present over the medial joint line. MCL laxity present.     Instability Tests: Anterior drawer test negative. Posterior drawer test negative.     Left knee: Swelling (medial aspect with  tenderness on palpation with slight effusiond) present. Normal range of motion. Tenderness present over the medial joint line. MCL laxity present.     Instability Tests: Anterior drawer test negative. Posterior drawer test negative.  Neurological:     Mental Status: She is alert.     BP 132/76  Pulse 61   Temp 98.6 F (37 C)   Resp 16   Ht 6' (1.829 m)   Wt 250 lb (113.4 kg)   SpO2 98%   BMI 33.91 kg/m  Wt Readings from Last 3 Encounters:  11/18/21 250 lb (113.4 kg)  08/18/21 246 lb 8 oz (111.8 kg)  12/06/20 239 lb 8 oz (108.6 kg)     Health Maintenance Due  Topic Date Due   OPHTHALMOLOGY EXAM  Never done   Zoster Vaccines- Shingrix (1 of 2) Never done   COVID-19 Vaccine (3 - Moderna risk series) 07/28/2019   Pneumonia Vaccine 65+ Years old (2 - PPSV23 or PCV20) 02/24/2021   HEMOGLOBIN A1C  06/05/2021   FOOT EXAM  09/02/2021   INFLUENZA VACCINE  10/18/2021   URINE MICROALBUMIN  12/06/2021    There are no preventive care reminders to display for this patient.  Lab Results  Component Value Date   TSH 1.08 04/30/2019   Lab Results  Component Value Date   WBC 6.7 12/06/2020   HGB 11.9 (L) 12/06/2020   HCT 36.8 12/06/2020   MCV 81.4 12/06/2020   PLT 299.0 12/06/2020   Lab Results  Component Value Date   NA 139 08/18/2021   K 3.7 08/18/2021   CO2 30 08/18/2021   GLUCOSE 95 08/18/2021   BUN 15 08/18/2021   CREATININE 0.92 08/18/2021   BILITOT 0.5 08/18/2021   ALKPHOS 63 08/18/2021   AST 13 08/18/2021   ALT 10 08/18/2021   PROT 7.2 08/18/2021   ALBUMIN 4.3 08/18/2021   CALCIUM 9.9 08/18/2021   ANIONGAP 7 11/10/2016   GFR 64.80 08/18/2021   Lab Results  Component Value Date   HGBA1C 6.3 (A) 12/06/2020      Assessment & Plan:   Problem List Items Addressed This Visit       Other   Chronic pain of both knees - Primary    Xray bil knees today pending results Elevated, rest, heat to site meloxicam 15 mg daily as prescribed Referral to  orthopaedist, may need to consider need for possible steroid injections Advised to use cane when walking as right >left knee pain.  Advised to wear compression sleeves daily. Work on knee exercises Can also consider physical therapy if necessary in future.      Relevant Orders   AMB referral to orthopedics    No orders of the defined types were placed in this encounter.   Follow-up: Return in about 3 months (around 02/17/2022) for schedule TOC with me.    Eugenia Pancoast, FNP

## 2021-11-18 NOTE — Assessment & Plan Note (Signed)
Xray bil knees today pending results Elevated, rest, heat to site meloxicam 15 mg daily as prescribed Referral to orthopaedist, may need to consider need for possible steroid injections Advised to use cane when walking as right >left knee pain.  Advised to wear compression sleeves daily. Work on knee exercises Can also consider physical therapy if necessary in future.

## 2021-12-07 ENCOUNTER — Ambulatory Visit (INDEPENDENT_AMBULATORY_CARE_PROVIDER_SITE_OTHER): Payer: Medicare HMO | Admitting: Family Medicine

## 2021-12-07 VITALS — BP 138/90 | HR 57 | Temp 97.3°F | Ht 70.0 in | Wt 250.5 lb

## 2021-12-07 DIAGNOSIS — E119 Type 2 diabetes mellitus without complications: Secondary | ICD-10-CM | POA: Diagnosis not present

## 2021-12-07 DIAGNOSIS — M8580 Other specified disorders of bone density and structure, unspecified site: Secondary | ICD-10-CM | POA: Diagnosis not present

## 2021-12-07 DIAGNOSIS — E785 Hyperlipidemia, unspecified: Secondary | ICD-10-CM

## 2021-12-07 DIAGNOSIS — Z0000 Encounter for general adult medical examination without abnormal findings: Secondary | ICD-10-CM

## 2021-12-07 DIAGNOSIS — Z23 Encounter for immunization: Secondary | ICD-10-CM | POA: Diagnosis not present

## 2021-12-07 DIAGNOSIS — I10 Essential (primary) hypertension: Secondary | ICD-10-CM

## 2021-12-07 DIAGNOSIS — F419 Anxiety disorder, unspecified: Secondary | ICD-10-CM | POA: Diagnosis not present

## 2021-12-07 MED ORDER — METOPROLOL SUCCINATE ER 100 MG PO TB24: ORAL_TABLET | ORAL | 1 refills | Status: DC

## 2021-12-07 MED ORDER — HYDROCHLOROTHIAZIDE 25 MG PO TABS: 25.0000 mg | ORAL_TABLET | Freq: Every day | ORAL | 1 refills | Status: DC

## 2021-12-07 MED ORDER — ATORVASTATIN CALCIUM 80 MG PO TABS: 80.0000 mg | ORAL_TABLET | Freq: Every day | ORAL | 1 refills | Status: DC

## 2021-12-07 MED ORDER — METFORMIN HCL ER 500 MG PO TB24: 500.0000 mg | ORAL_TABLET | Freq: Every day | ORAL | 1 refills | Status: DC

## 2021-12-07 MED ORDER — AMLODIPINE BESYLATE 5 MG PO TABS: 5.0000 mg | ORAL_TABLET | Freq: Every day | ORAL | 1 refills | Status: DC

## 2021-12-07 MED ORDER — LOSARTAN POTASSIUM 25 MG PO TABS: 25.0000 mg | ORAL_TABLET | Freq: Every day | ORAL | 1 refills | Status: DC

## 2021-12-07 MED ORDER — SERTRALINE HCL 100 MG PO TABS: 100.0000 mg | ORAL_TABLET | Freq: Every day | ORAL | 1 refills | Status: DC

## 2021-12-07 NOTE — Patient Instructions (Addendum)
Return for labs in 2 weeks - send mychart with home readings    Check with [place] booster  Your blood pressure high.   Start losartan 25 mg  High blood pressure increases your risk for heart attack and stroke.    Please check your blood pressure 2-4 times a week.   To check your blood pressure 1) Sit in a quiet and relaxed place for 5 minutes 2) Make sure your feet are flat on the ground 3) Consider checking first thing in the morning   Normal blood pressure is less than 140/90 Ideally you blood pressure should be around 120/80  Other ways you can reduce your blood pressure:  1) Regular exercise -- Try to get 150 minutes (30 minutes, 5 days a week) of moderate to vigorous aerobic excercise -- Examples: brisk walking (2.5 miles per hour), water aerobics, dancing, gardening, tennis, biking slower than 10 miles per hour 2) DASH Diet - low fat meats, more fresh fruits and vegetables, whole grains, low salt 3) Quit smoking if you smoke 4) Loose 5-10% of your body weight

## 2021-12-07 NOTE — Assessment & Plan Note (Signed)
Controlled on zoloft 100 mg, continue

## 2021-12-07 NOTE — Assessment & Plan Note (Addendum)
Elevated and ankle swelling with amlodipine. Cont metoprolol XR100 mg, hctz 25 mg, amlodipine 5 mg (temporarily). Start losartan 25 mg. Update with home readings and return for labs in 2 weeks. If tolerating and BP controlled will work to stop amlodipine.

## 2021-12-07 NOTE — Progress Notes (Signed)
Subjective:   Olivia Kirk is a 66 y.o. female who presents for Medicare Annual (Subsequent) preventive examination.  Review of Systems    Review of Systems  Constitutional:  Negative for chills and fever.  HENT:  Negative for congestion and sore throat.   Eyes:  Negative for blurred vision and double vision.  Respiratory:  Negative for shortness of breath.   Cardiovascular:  Negative for chest pain.  Gastrointestinal:  Negative for heartburn, nausea and vomiting.  Genitourinary: Negative.   Musculoskeletal: Negative.  Negative for myalgias.  Skin:  Negative for rash.  Neurological:  Negative for dizziness and headaches.  Endo/Heme/Allergies:  Does not bruise/bleed easily.  Psychiatric/Behavioral:  Negative for depression. The patient is not nervous/anxious.     Cardiac Risk Factors include: advanced age (>54men, >17 women);smoking/ tobacco exposure;diabetes mellitus;dyslipidemia;hypertension;obesity (BMI >30kg/m2)     Objective:    Today's Vitals   12/07/21 0848 12/07/21 0921  BP: (!) 142/92 (!) 138/90  Pulse: (!) 57   Temp: (!) 97.3 F (36.3 C)   TempSrc: Temporal   SpO2: 96%   Weight: 250 lb 8 oz (113.6 kg)   Height: $Remove'5\' 10"'[id number]$  (1.778 m)    Body mass index is 35.94 kg/m.     12/07/2021    8:59 AM 07/02/2018   10:35 AM 01/03/2018    2:56 PM 10/01/2017   10:21 AM 11/10/2016   12:51 PM 10/12/2016   10:03 AM 07/19/2016   10:05 AM  Advanced Directives  Does Patient Have a Medical Advance Directive? No No No No No No No  Would patient like information on creating a medical advance directive? Yes (MAU/Ambulatory/Procedural Areas - Information given) No - Patient declined No - Patient declined No - Patient declined  No - Patient declined No - Patient declined   Social History   Tobacco Use  Smoking Status Former   Packs/day: 0.25   Years: 6.00   Total pack years: 1.50   Types: Cigarettes   Quit date: 05/07/1987   Years since quitting: 34.6  Smokeless Tobacco Never      Current Medications (verified) Outpatient Encounter Medications as of 12/07/2021  Medication Sig   Bioflavonoid Products (BIOFLEX PO) Take by mouth daily.   Biotin 1000 MCG tablet Take by mouth daily.   Calcium Carbonate (CALCIUM 600 PO) Take by mouth daily.   Cholecalciferol (VITAMIN D3) 1000 units CAPS Take by mouth.   docusate sodium (COLACE) 100 MG capsule Take 100 mg by mouth daily as needed for mild constipation.   losartan (COZAAR) 25 MG tablet Take 1 tablet (25 mg total) by mouth daily.   meloxicam (MOBIC) 15 MG tablet Take 15 mg by mouth daily.   Multiple Vitamins-Minerals (ALIVE WOMENS 50+ PO) Take by mouth daily.   Omega-3 Fatty Acids (FISH OIL) 1000 MG CAPS Take by mouth daily.   triamcinolone ointment (KENALOG) 0.5 % Apply 1 application topically 2 (two) times daily.   vitamin B-12 (CYANOCOBALAMIN) 1000 MCG tablet Take 1,000 mcg by mouth daily.   [DISCONTINUED] amLODipine (NORVASC) 5 MG tablet Take 1 tablet (5 mg total) by mouth daily.   [DISCONTINUED] atorvastatin (LIPITOR) 80 MG tablet Take 1 tablet (80 mg total) by mouth daily.   [DISCONTINUED] hydrochlorothiazide (HYDRODIURIL) 25 MG tablet Take 1 tablet (25 mg total) by mouth daily.   [DISCONTINUED] metFORMIN (GLUCOPHAGE XR) 500 MG 24 hr tablet Take 1 tablet (500 mg total) by mouth daily with breakfast.   [DISCONTINUED] metoprolol succinate (TOPROL-XL) 100 MG 24 hr tablet Take  with or immediately following a meal.   [DISCONTINUED] sertraline (ZOLOFT) 100 MG tablet Take 1 tablet (100 mg total) by mouth daily.   amLODipine (NORVASC) 5 MG tablet Take 1 tablet (5 mg total) by mouth daily.   atorvastatin (LIPITOR) 80 MG tablet Take 1 tablet (80 mg total) by mouth daily.   hydrochlorothiazide (HYDRODIURIL) 25 MG tablet Take 1 tablet (25 mg total) by mouth daily.   metFORMIN (GLUCOPHAGE XR) 500 MG 24 hr tablet Take 1 tablet (500 mg total) by mouth daily with breakfast.   metoprolol succinate (TOPROL-XL) 100 MG 24 hr tablet  Take with or immediately following a meal.   sertraline (ZOLOFT) 100 MG tablet Take 1 tablet (100 mg total) by mouth daily.   No facility-administered encounter medications on file as of 12/07/2021.    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   GERD (gastroesophageal reflux disease)    RARE   Hyperlipidemia    Hypertension    Personal history of radiation therapy 2018   left breast cancer   Past Surgical History:  Procedure Laterality Date   BREAST BIOPSY Left 06/06/2016   left stereo. invasive mammary carcinoma   BREAST LUMPECTOMY Left 07/03/2016   invasive mammary carcinoma   BREAST LUMPECTOMY WITH NEEDLE LOCALIZATION Left 07/03/2016   Procedure: BREAST LUMPECTOMY WITH NEEDLE LOCALIZATION;  Surgeon: Robert Bellow, MD;  Location: [facility] ORS;  Service: General;  Laterality: Left;   BREAST LUMPECTOMY WITH SENTINEL LYMPH NODE BIOPSY Left 07/03/2016   Procedure: BREAST LUMPECTOMY WITH SENTINEL LYMPH NODE BX;  Surgeon: Robert Bellow, MD;  Location: [facility] ORS;  Service: General;  Laterality: Left;   hemrrhoid     TUBAL LIGATION     Family History  Problem Relation Age of Onset   Hypertension Mother    Cancer Father        unknown type   Heart disease Father    Breast cancer Paternal Aunt 74   Hypertension Maternal Grandmother    Stroke Maternal Grandmother    Social History   Socioeconomic History   Marital status: Married    Spouse name: [name]   Number of children: 4   Years of education: 12   Highest education level: Not on file  Occupational History   Occupation: Medical laboratory scientific officer: [employer]    Comment: [employer] in [employer]   Occupation: Cleaning services     Comment: [employer]  Tobacco Use   Smoking status: Former    Packs/day: 0.25    Years: 6.00    Total pack years: 1.50    Types: Cigarettes    Quit date: 05/07/1987    Years since quitting: 34.6   Smokeless tobacco: Never  Vaping Use   Vaping Use: Never used  Substance and Sexual Activity   Alcohol use: Not Currently   Drug use: No   Sexual activity: Not Currently  Other Topics Concern   Not on file  Social History Narrative   09/02/20   From: [place] originally   Living: with Donnie, husband (1982) and Daughter and 2 grandchildren and son on the property   Work: retired, working as caregiver  Family: 4 children - [name], twins - [place] and Franco Nones - 4 grandchildren       Enjoys: watch movies, go to church      Exercise: not currently   Diet: diabetic diet, healthy      Safety   Seat belts: Yes    Guns: no    Safe in relationships: Yes    '   Social Determinants of Radio broadcast assistant Strain: Not on file  Food Insecurity: Not on file  Transportation Needs: Not on file  Physical Activity: Not on file  Stress: Not on file  Social Connections: Not on file    Tobacco Counseling Counseling given: Not Answered   Clinical Intake:  Pre-visit preparation completed: No  Pain : No/denies pain     BMI - recorded: 35.94 Nutritional Status: BMI > 30  Obese Nutritional Risks: None Diabetes: Yes CBG done?: No Did pt. bring in CBG monitor from home?: No  How often do you need to have someone help you when you read instructions, pamphlets, or other written materials from your doctor or pharmacy?: 1 - Never  Diabetic?yes  Interpreter Needed?: No      Activities of Daily Living    12/07/2021    9:02 AM  In your present state of health, do you have any difficulty performing the following activities:  Hearing? 0  Vision? 0  Difficulty concentrating or making decisions? 0  Walking or climbing stairs? 0  Dressing or bathing? 0  Doing errands,  shopping? 0  Preparing Food and eating ? N  Using the Toilet? N  In the past six months, have you accidently leaked urine? Y  Do you have problems with loss of bowel control? N  Managing your Medications? N  Managing your Finances? N  Housekeeping or managing your Housekeeping? N    Patient Care Team: Lesleigh Noe, MD as PCP - General (Family Medicine) Sanda Klein Satira Anis, MD as Attending Physician (Family Medicine)  Indicate any recent Medical Services you may have received from other than [facility] providers in the past year (date may be approximate).     Assessment:   This is a routine wellness examination for Riti.  Hearing/Vision screen Hearing Screening - Comments:: No concerns  Dietary issues and exercise activities discussed: Current Exercise Habits: The patient does not participate in regular exercise at present, Exercise limited by: orthopedic condition(s)   Goals Addressed             This Visit's Progress    Patient Stated       Hoping to retire next year      Depression Screen    12/07/2021    9:23 AM 02/25/2020    3:27 PM 08/25/2019    3:15 PM 11/28/2017    3:49 PM 11/28/2017    3:35 PM 05/13/2012   11:10 AM  PHQ 2/9 Scores  PHQ - 2 Score 0 0 0 1 1 0  PHQ- 9 Score    3      Fall Risk    12/07/2021    8:45 AM 11/18/2021    8:10 AM 02/25/2020    3:27 PM  Fall Risk   Falls in the past year? 1 1 0  Number falls in past yr: 1 1 0  Injury with Fall? 1 1 0  Risk for fall due to : History of fall(s)    Follow up   Falls evaluation completed    FALL RISK PREVENTION PERTAINING TO  THE HOME:  Any stairs in or around the home? Yes  If so, are there any without handrails? Yes  Home free of loose throw rugs in walkways, pet beds, electrical cords, etc? Yes  Adequate lighting in your home to reduce risk of falls? Yes   ASSISTIVE DEVICES UTILIZED TO PREVENT FALLS:  Life alert? No  Use of a cane, walker or w/c? No  Grab bars in the bathroom? No  Shower chair  or bench in shower? No  Elevated toilet seat or a handicapped toilet? No   Cognitive Function:      Mini-Cog - 12/07/21 0904     Normal clock drawing test? yes    How many words correct? 3                Immunizations Immunization History  Administered Date(s) Administered   Fluad Quad(high Dose 65+) 02/25/2020, 12/06/2020, 12/07/2021   Influenza,inj,Quad PF,6+ Mos 12/04/2013, 02/02/2016, 11/28/2017, 12/16/2018   Moderna Sars-Covid-2 Vaccination 05/28/2019, 06/30/2019   PPD Test 06/09/2019, 07/12/2020   Pneumococcal Conjugate-13 02/25/2020   Pneumococcal Polysaccharide-23 12/07/2021   Tdap 05/13/2012   Zoster, Live 05/13/2012    TDAP status: Up to date  Flu Vaccine status: Up to date  Pneumococcal vaccine status: Due, Education has been provided regarding the importance of this vaccine. Advised may receive this vaccine at local pharmacy or Health Dept. Aware to provide a copy of the vaccination record if obtained from local pharmacy or Health Dept. Verbalized acceptance and understanding.  Covid-19 vaccine status: Information provided on how to obtain vaccines.   Qualifies for Shingles Vaccine? Yes   Zostavax completed No   Shingrix Completed?: No.    Education has been provided regarding the importance of this vaccine. Patient has been advised to call insurance company to determine out of pocket expense if they have not yet received this vaccine. Advised may also receive vaccine at local pharmacy or Health Dept. Verbalized acceptance and understanding.  Screening Tests Health Maintenance  Topic Date Due   OPHTHALMOLOGY EXAM  Never done   Zoster Vaccines- Shingrix (1 of 2) Never done   COVID-19 Vaccine (3 - Moderna risk series) 07/28/2019   HEMOGLOBIN A1C  06/05/2021   Diabetic kidney evaluation - Urine ACR  12/06/2021   TETANUS/TDAP  05/13/2022   COLONOSCOPY (Pts 45-71yrs Insurance coverage will need to be confirmed)  06/04/2022   MAMMOGRAM  07/07/2022    Diabetic kidney evaluation - GFR measurement  08/19/2022   FOOT EXAM  12/08/2022   Pneumonia Vaccine 23+ Years old  Completed   INFLUENZA VACCINE  Completed   DEXA SCAN  Completed   Hepatitis C Screening  Completed   HPV VACCINES  Aged Out    Health Maintenance  Health Maintenance Due  Topic Date Due   OPHTHALMOLOGY EXAM  Never done   Zoster Vaccines- Shingrix (1 of 2) Never done   COVID-19 Vaccine (3 - Moderna risk series) 07/28/2019   HEMOGLOBIN A1C  06/05/2021   Diabetic kidney evaluation - Urine ACR  12/06/2021    Colorectal cancer screening: Type of screening: Colonoscopy. Completed 2014. Repeat every 10 years  Mammogram status: Completed 06/2020. Repeat every year  Bone Density status: Completed 06/2020. Results reflect: Bone density results: OSTEOPENIA. Repeat every 3-5 years.  Lung Cancer Screening: (Low Dose CT Chest recommended if Age 43-80 years, 30 pack-year currently smoking OR have quit w/in 15years.) does not qualify.   Lung Cancer Screening Referral: n/a  Additional Screening:  Hepatitis C Screening: does qualify;  Completed 12/21  Vision Screening: Recommended annual ophthalmology exams for early detection of glaucoma and other disorders of the eye. Is the patient up to date with their annual eye exam?  Yes    Dental Screening: Recommended annual dental exams for proper oral hygiene  Community Resource Referral / Chronic Care Management: CRR required this visit?  No   CCM required this visit?  No      Plan:    Problem List Items Addressed This Visit       Cardiovascular and Mediastinum   Essential hypertension    Elevated and ankle swelling with amlodipine. Cont metoprolol XR100 mg, hctz 25 mg, amlodipine 5 mg (temporarily). Start losartan 25 mg. Update with home readings and return for labs in 2 weeks. If tolerating and BP controlled will work to stop amlodipine.       Relevant Medications   amLODipine (NORVASC) 5 MG tablet   atorvastatin  (LIPITOR) 80 MG tablet   hydrochlorothiazide (HYDRODIURIL) 25 MG tablet   metoprolol succinate (TOPROL-XL) 100 MG 24 hr tablet   losartan (COZAAR) 25 MG tablet   Other Relevant Orders   Basic metabolic panel     Musculoskeletal and Integument   Osteopenia     Other   HLD (hyperlipidemia)   Relevant Medications   amLODipine (NORVASC) 5 MG tablet   atorvastatin (LIPITOR) 80 MG tablet   hydrochlorothiazide (HYDRODIURIL) 25 MG tablet   metoprolol succinate (TOPROL-XL) 100 MG 24 hr tablet   losartan (COZAAR) 25 MG tablet   Other Relevant Orders   Lipid panel   Anxiety    Controlled on zoloft 100 mg, continue      Relevant Medications   sertraline (ZOLOFT) 100 MG tablet   Other Visit Diagnoses     Need for influenza vaccination    -  Primary   Relevant Orders   Flu Vaccine QUAD High Dose(Fluad) (Completed)   Controlled type 2 diabetes mellitus without complication, without long-term current use of insulin (HCC)       Relevant Medications   atorvastatin (LIPITOR) 80 MG tablet   metFORMIN (GLUCOPHAGE XR) 500 MG 24 hr tablet   losartan (COZAAR) 25 MG tablet   Other Relevant Orders   Hemoglobin A1c   Microalbumin / creatinine urine ratio   Encounter for Medicare annual wellness exam       Need for 23-polyvalent pneumococcal polysaccharide vaccine       Relevant Orders   Pneumococcal polysaccharide vaccine 23-valent greater than or equal to 2yo subcutaneous/IM (Completed)        I have personally reviewed and noted the following in the patient's chart:   Medical and social history Use of alcohol, tobacco or illicit drugs  Current medications and supplements including opioid prescriptions. Patient is not currently taking opioid prescriptions. Functional ability and status Nutritional status Physical activity Advanced directives List of other physicians Hospitalizations, surgeries, and ER visits in previous 12 months Vitals Screenings to include cognitive, depression,  and falls Referrals and appointments  In addition, I have reviewed and discussed with patient certain preventive protocols, quality metrics, and best practice recommendations. A written personalized care plan for preventive services as well as general preventive health recommendations were provided to patient.     Lesleigh Noe, MD   12/07/2021

## 2021-12-14 ENCOUNTER — Ambulatory Visit: Payer: Medicare HMO | Admitting: Orthopaedic Surgery

## 2021-12-21 ENCOUNTER — Ambulatory Visit (INDEPENDENT_AMBULATORY_CARE_PROVIDER_SITE_OTHER): Payer: Medicare HMO | Admitting: Orthopaedic Surgery

## 2021-12-21 ENCOUNTER — Encounter: Payer: Self-pay | Admitting: Orthopaedic Surgery

## 2021-12-21 DIAGNOSIS — M1712 Unilateral primary osteoarthritis, left knee: Secondary | ICD-10-CM

## 2021-12-21 DIAGNOSIS — M170 Bilateral primary osteoarthritis of knee: Secondary | ICD-10-CM | POA: Diagnosis not present

## 2021-12-21 MED ORDER — METHYLPREDNISOLONE ACETATE 40 MG/ML IJ SUSP
80.0000 mg | INTRAMUSCULAR | Status: AC | PRN
  Administered 2021-12-21: 80 mg via INTRA_ARTICULAR

## 2021-12-21 MED ORDER — LIDOCAINE HCL 1 % IJ SOLN
2.0000 mL | INTRAMUSCULAR | Status: AC | PRN
  Administered 2021-12-21: 2 mL

## 2021-12-21 MED ORDER — BUPIVACAINE HCL 0.25 % IJ SOLN
2.0000 mL | INTRAMUSCULAR | Status: AC | PRN
  Administered 2021-12-21: 2 mL via INTRA_ARTICULAR

## 2021-12-21 NOTE — Progress Notes (Signed)
Office Visit Note   Patient: Olivia Kirk           Date of Birth: [birth year]           MRN: [id number] Visit Date: 12/21/2021              Requested by: Mort Sawyers, FNP [address] [place],  [place] [address] PCP: Lynnda Child, MD   Assessment & Plan: Visit Diagnoses:  1. Bilateral primary osteoarthritis of knee     Plan: Mrs. Olivia Kirk by her daughter and seen for evaluation of bilateral knee pain she has been having trouble now for years but worse more recently.  She was seen by her primary care physician.  X-rays were performed through his office.  These were reviewed demonstrating significant arthritis in the left knee with near bone-on-bone in the medial compartment and lesser findings on the right knee.  She has tried meloxicam, bracing and even Voltaren gel and continues to have a problem.  She is now using a cane.  She is having more trouble on the left.  On occasion she has a sensation that it wants to give way.  Long discussion regarding her diagnosis and treatment options.  Discussed cortisone injections and she would like to proceed in the left knee. we might even want to consider viscosupplementation in the future.  He may continue with her over-the-counter meds.  She is diabetic and is taking metformin.  She is aware that her blood sugars may be elevated over the next several days  Follow-Up Instructions: Return if symptoms worsen or fail to improve.   Orders:  No orders of the defined types were placed in this encounter.  No orders of the defined types were placed in this encounter.     Procedures: Large Joint Inj: L knee on 12/21/2021 1:43 PM Indications: pain and diagnostic evaluation Details: 25 G 1.5 in needle, anteromedial approach  Arthrogram: No  Medications: 2 mL lidocaine 1 %; 80 mg methylPREDNISolone acetate 40 MG/ML; 2 mL bupivacaine 0.25 % Procedure, treatment alternatives, risks and benefits explained, specific risks discussed.  Consent was given by the patient. Patient was prepped and draped in the usual sterile fashion.       Clinical Data: No additional findings.   Subjective: Chief Complaint  Patient presents with   Right Knee - Pain   Left Knee - Pain  Patient presents today for bilateral knee pain. She said that she has had pain for years. The left is worse than the right. She said that they swell and give way. She has tried medications, bracing, and a cane. Nothing helps. She does not take pain medicines because they do not help. She is diabetic. No other prior treatment.  HPI  Review of Systems   Objective: Vital Signs: There were no vitals taken for this visit.  Physical Exam Constitutional:      Appearance: She is well-developed.  Pulmonary:     Effort: Pulmonary effort is normal.  Skin:    General: Skin is warm and dry.  Neurological:     Mental Status: She is alert and oriented to person, place, and time.  Psychiatric:        Behavior: Behavior normal.     Ortho Exam awake alert and oriented x3.  Comfortable sitting.  Large knees but no evidence of an obvious effusion.  She is having more pain along the medial compartment of the left knee than she does on the  right and little if any pain laterally on either knee.  There is minimal patella crepitation on the left and none on the right.  No instability.  Full extension and at least to 100 degrees of flexion.  No popliteal pain or mass.  No calf pain or distal edema.  Palpable hypertrophic osteophytes along the medial compartment of the left knee  Specialty Comments:  No specialty comments available.  Imaging: No results found.   PMFS History: Patient Active Problem List   Diagnosis Date Noted   Bilateral primary osteoarthritis of knee 12/21/2021   Osteopenia 12/07/2021   Cervical radicular pain 08/18/2021   Chronic pain of both knees 12/06/2020   Type 2 diabetes mellitus with diabetic neuropathy, unspecified ([place]) 09/02/2020    Anxiety 09/02/2020   Arthritis of left knee 09/02/2020   Other specified disorders of eustachian tube, right ear 07/02/2018   History of breast cancer 06/09/2016   Type 2 diabetes mellitus with other specified complication ([place]) 70/26/3785   Obesity (BMI 30-39.9) 12/23/2014   Essential hypertension 05/13/2012   HLD (hyperlipidemia) 05/13/2012   Past Medical History:  Diagnosis Date   Anemia    H/O   Arthritis    FINGER MIDDLE FINGER RIGHT HAND, LEFT KNEE   Breast cancer ([place]) 06/06/2016   LEFT: T1b (6 mm), N0, 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   GERD (gastroesophageal reflux disease)    RARE   Hyperlipidemia    Hypertension    Personal history of radiation therapy 2018   left breast cancer    Family History  Problem Relation Age of Onset   Hypertension Mother    Cancer Father        unknown type   Heart disease Father    Breast cancer Paternal Aunt 34   Hypertension Maternal Grandmother    Stroke Maternal Grandmother     Past Surgical History:  Procedure Laterality Date   BREAST BIOPSY Left 06/06/2016   left stereo. invasive mammary carcinoma   BREAST LUMPECTOMY Left 07/03/2016   invasive mammary carcinoma   BREAST LUMPECTOMY WITH NEEDLE LOCALIZATION Left 07/03/2016   Procedure: BREAST LUMPECTOMY WITH NEEDLE LOCALIZATION;  Surgeon: Robert Bellow, MD;  Location: [facility] ORS;  Service: General;  Laterality: Left;   BREAST LUMPECTOMY WITH SENTINEL LYMPH NODE BIOPSY Left 07/03/2016   Procedure: BREAST LUMPECTOMY WITH SENTINEL LYMPH NODE BX;  Surgeon: Robert Bellow, MD;  Location: [facility] ORS;  Service: General;  Laterality: Left;   hemrrhoid     TUBAL LIGATION     Social History   Occupational History   Occupation: Medical laboratory scientific officer: [employer]    Comment: [employer] in [employer]   Occupation: Cleaning services    Comment: [employer]  Tobacco Use   Smoking status: Former    Packs/day: 0.25    Years: 6.00    Total  pack years: 1.50    Types: Cigarettes    Quit date: 05/07/1987    Years since quitting: 34.6   Smokeless tobacco: Never  Vaping Use   Vaping Use: Never used  Substance and Sexual Activity   Alcohol use: Not Currently   Drug use: No   Sexual activity: Not Currently

## 2022-02-06 ENCOUNTER — Encounter: Payer: Self-pay | Admitting: Family

## 2022-02-06 ENCOUNTER — Ambulatory Visit (INDEPENDENT_AMBULATORY_CARE_PROVIDER_SITE_OTHER): Payer: Medicare HMO | Admitting: Family

## 2022-02-06 VITALS — BP 122/86 | HR 58 | Temp 98.0°F | Resp 16 | Ht 70.0 in | Wt 248.0 lb

## 2022-02-06 DIAGNOSIS — E119 Type 2 diabetes mellitus without complications: Secondary | ICD-10-CM | POA: Diagnosis not present

## 2022-02-06 DIAGNOSIS — I10 Essential (primary) hypertension: Secondary | ICD-10-CM

## 2022-02-06 DIAGNOSIS — E785 Hyperlipidemia, unspecified: Secondary | ICD-10-CM | POA: Diagnosis not present

## 2022-02-06 DIAGNOSIS — E1140 Type 2 diabetes mellitus with diabetic neuropathy, unspecified: Secondary | ICD-10-CM

## 2022-02-06 LAB — LIPID PANEL
Cholesterol: 155 mg/dL (ref 0–200)
HDL: 42.3 mg/dL (ref >39.00)
LDL Cholesterol: 83 mg/dL (ref 0–99)
NonHDL: 113.15
Total CHOL/HDL Ratio: 4
Triglycerides: 151 mg/dL — ABNORMAL HIGH (ref 0.0–149.0)
VLDL: 30.2 mg/dL (ref 0.0–40.0)

## 2022-02-06 LAB — BASIC METABOLIC PANEL
BUN: 10 mg/dL (ref 6–23)
CO2: 30 mEq/L (ref 19–32)
Calcium: 10.2 mg/dL (ref 8.4–10.5)
Chloride: 103 mEq/L (ref 96–112)
Creatinine, Ser: 0.91 mg/dL (ref 0.40–1.20)
GFR: 65.43 mL/min (ref >60.00)
Glucose, Bld: 90 mg/dL (ref 70–99)
Potassium: 3.4 mEq/L — ABNORMAL LOW (ref 3.5–5.1)
Sodium: 141 mEq/L (ref 135–145)

## 2022-02-06 LAB — MICROALBUMIN / CREATININE URINE RATIO
Creatinine,U: 145.6 mg/dL
Microalb Creat Ratio: 0.6 mg/g (ref 0.0–30.0)
Microalb, Ur: 0.8 mg/dL (ref 0.0–1.9)

## 2022-02-06 LAB — HEMOGLOBIN A1C: Hgb A1c MFr Bld: 7 % — ABNORMAL HIGH (ref 4.6–6.5)

## 2022-02-06 MED ORDER — LOSARTAN POTASSIUM 50 MG PO TABS: 50.0000 mg | ORAL_TABLET | Freq: Every day | ORAL | 1 refills | Status: DC

## 2022-02-06 NOTE — Assessment & Plan Note (Signed)
Stop amlodipine to see if pedal edema improves Increase losartan to 50 mg once daily, may need to increase to 100 mg depending on blood pressure readings.

## 2022-02-06 NOTE — Progress Notes (Signed)
Diabetes hga1c has increased significantly, has diet changed? Was 6.3 now at 7.0.  Does pt prefer to work on diet x three months without addition of meds? Or start medications?  If three months, have pt work on diabetic diet start exercising as able and follow up in three months to repeat labs and discuss.   Potassium low by one point, will continue to monitor Negative urine microalbumin.   Cholesterol has improved keep up good work.

## 2022-02-06 NOTE — Assessment & Plan Note (Signed)
Order hga1c today, pending results. Work on diabetic diet and exercise as tolerated. Yearly foot exam, and annual eye exam (referral placed today for annual eye exam)

## 2022-02-06 NOTE — Patient Instructions (Addendum)
  Increase losartan to 50 mg once daily and stop amlodipine. Let's see if your ankle swelling goes down.   Stop by the lab prior to leaving today. I will notify you of your results once received.    Regards,   Eugenia Pancoast FNP-C

## 2022-02-06 NOTE — Progress Notes (Signed)
Established Patient Office Visit  Subjective:  Patient ID: Olivia Kirk, female    DOB: 10-25-1955  Age: 66 y.o. MRN: [id number]  CC:  Chief Complaint  Patient presents with   Transitions Of Care    HPI Olivia Kirk is here today for transfer of care Prior provider was Dr. Waunita Schooner.   HTN: on metoprolol XR 100 mg, hctz 25 mg, and started losartan 25 mg last visit. Still taking amlodipine 5 mg once daily. Still with ankle swelling today.   Bil knee pain": recent injection right knee with orthopedists. Stopped her mobic bc she states wasn't really helping her with the pain at all.   DM2: diet controlled, doing well. Overdue for eye exam.  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   GERD (gastroesophageal reflux disease)    RARE   Hyperlipidemia    Hypertension    Personal history of radiation therapy 2018   left breast cancer    Past Surgical History:  Procedure Laterality Date   BREAST BIOPSY Left 06/06/2016   left stereo. invasive mammary carcinoma   BREAST LUMPECTOMY Left 07/03/2016   invasive mammary carcinoma   BREAST LUMPECTOMY WITH NEEDLE LOCALIZATION Left 07/03/2016   Procedure: BREAST LUMPECTOMY WITH NEEDLE LOCALIZATION;  Surgeon: Robert Bellow, MD;  Location: [facility] ORS;  Service: General;  Laterality: Left;   BREAST LUMPECTOMY WITH SENTINEL LYMPH NODE BIOPSY Left 07/03/2016   Procedure: BREAST LUMPECTOMY WITH SENTINEL LYMPH NODE BX;  Surgeon: Robert Bellow, MD;  Location: [facility] ORS;  Service: General;  Laterality: Left;   hemrrhoid     TUBAL LIGATION      Family History  Problem Relation Age of Onset   Hypertension Mother    Cancer Father        unknown type   Heart  disease Father    Breast cancer Paternal Aunt 35   Hypertension Maternal Grandmother    Stroke Maternal Grandmother     Social History   Socioeconomic History   Marital status: Married    Spouse name: [name]   Number of children: 4   Years of education: 12   Highest education level: Not on file  Occupational History    Employer: [employer]    Comment: caregiver  Tobacco Use   Smoking status: Former    Packs/day: 0.25    Years: 6.00    Total pack years: 1.50    Types: Cigarettes    Quit date: 05/07/1987    Years since quitting: 34.7   Smokeless tobacco: Never  Vaping Use   Vaping Use: Never used  Substance and Sexual Activity   Alcohol use: Not Currently   Drug use: No   Sexual activity: Yes    Partners: Male    Birth control/protection: Post-menopausal  Other Topics Concern   Not on file  Social History Narrative   09/02/20   From: [place] originally   Living: with Donnie, husband (1982) and Daughter and 2 grandchildren and son on the property   Work: retired, working as caregiver      Family: 4 children - [name], twins - Sports coach and [name] -  4 grandchildren       Enjoys: watch movies, go to church      Exercise: not currently   Diet: diabetic diet, healthy      Safety   Seat belts: Yes    Guns: no    Safe in relationships: Yes    '   Social Determinants of Health   Financial Resource Strain: Not on file  Food Insecurity: Not on file  Transportation Needs: Not on file  Physical Activity: Not on file  Stress: Not on file  Social Connections: Not on file  Intimate Partner Violence: Not on file    Outpatient Medications Prior to Visit  Medication Sig Dispense Refill   atorvastatin (LIPITOR) 80 MG tablet Take 1 tablet (80 mg total) by mouth daily. 90 tablet 1   Bioflavonoid Products (BIOFLEX PO) Take by mouth daily.     Biotin 1000 MCG tablet Take by mouth daily.     Calcium Carbonate (CALCIUM 600 PO) Take by mouth daily.      Cholecalciferol (VITAMIN D3) 1000 units CAPS Take by mouth.     hydrochlorothiazide (HYDRODIURIL) 25 MG tablet Take 1 tablet (25 mg total) by mouth daily. 90 tablet 1   metFORMIN (GLUCOPHAGE XR) 500 MG 24 hr tablet Take 1 tablet (500 mg total) by mouth daily with breakfast. 90 tablet 1   metoprolol succinate (TOPROL-XL) 100 MG 24 hr tablet Take with or immediately following a meal. 90 tablet 1   Multiple Vitamins-Minerals (ALIVE WOMENS 50+ PO) Take by mouth daily.     Omega-3 Fatty Acids (FISH OIL) 1000 MG CAPS Take by mouth daily.     sertraline (ZOLOFT) 100 MG tablet Take 1 tablet (100 mg total) by mouth daily. 90 tablet 1   vitamin B-12 (CYANOCOBALAMIN) 1000 MCG tablet Take 1,000 mcg by mouth daily.     amLODipine (NORVASC) 5 MG tablet Take 1 tablet (5 mg total) by mouth daily. 90 tablet 1   losartan (COZAAR) 25 MG tablet Take 1 tablet (25 mg total) by mouth daily. 30 tablet 1   docusate sodium (COLACE) 100 MG capsule Take 100 mg by mouth daily as needed for mild constipation.     meloxicam (MOBIC) 15 MG tablet Take 15 mg by mouth daily.     triamcinolone ointment (KENALOG) 0.5 % Apply 1 application topically 2 (two) times daily. 30 g 0   No facility-administered medications prior to visit.    Allergies  Allergen Reactions   Guaifenesin & Derivatives Itching, Dermatitis and Rash   Blueberry Flavor Itching and Rash    Food allergy to blueberry          Objective:    Physical Exam Vitals reviewed.  Constitutional:      Appearance: Normal appearance. She is obese.  Cardiovascular:     Rate and Rhythm: Normal rate and regular rhythm.     Comments: Bil ankle edema non pitting Pulmonary:     Effort: Pulmonary effort is normal.  Neurological:     General: No focal deficit present.     Mental Status: She is alert and oriented to person, place, and time. Mental status is at baseline.  Psychiatric:        Mood and Affect: Mood normal.        Behavior: Behavior normal.         Thought Content: Thought content normal.        Judgment: Judgment normal.       BP 122/86   Pulse Marland Kitchen)  58   Temp 98 F (36.7 C)   Resp 16   Ht _0  (1.778 m)   Wt 248 lb (112.5 kg)   SpO2 97%   BMI 35.58 kg/m  Wt Readings from Last 3 Encounters:  02/06/22 248 lb (112.5 kg)  12/07/21 250 lb 8 oz (113.6 kg)  11/18/21 250 lb (113.4 kg)     Health Maintenance Due  Topic Date Due   OPHTHALMOLOGY EXAM  Never done   Zoster Vaccines- Shingrix (1 of 2) Never done   COVID-19 Vaccine (3 - Moderna series) 08/25/2019   HEMOGLOBIN A1C  06/05/2021   Diabetic kidney evaluation - Urine ACR  12/06/2021    There are no preventive care reminders to display for this patient.  Lab Results  Component Value Date   TSH 1.08 04/30/2019   Lab Results  Component Value Date   WBC 6.7 12/06/2020   HGB 11.9 (L) 12/06/2020   HCT 36.8 12/06/2020   MCV 81.4 12/06/2020   PLT 299.0 12/06/2020   Lab Results  Component Value Date   NA 139 08/18/2021   K 3.7 08/18/2021   CO2 30 08/18/2021   GLUCOSE 95 08/18/2021   BUN 15 08/18/2021   CREATININE 0.92 08/18/2021   BILITOT 0.5 08/18/2021   ALKPHOS 63 08/18/2021   AST 13 08/18/2021   ALT 10 08/18/2021   PROT 7.2 08/18/2021   ALBUMIN 4.3 08/18/2021   CALCIUM 9.9 08/18/2021   ANIONGAP 7 11/10/2016   GFR 64.80 08/18/2021   Lab Results  Component Value Date   CHOL 222 (H) 12/06/2020   Lab Results  Component Value Date   HDL 42.80 12/06/2020   Lab Results  Component Value Date   LDLCALC 87 02/18/2020   Lab Results  Component Value Date   TRIG 220.0 (H) 12/06/2020   Lab Results  Component Value Date   CHOLHDL 5 12/06/2020   Lab Results  Component Value Date   HGBA1C 6.3 (A) 12/06/2020      Assessment & Plan:   Problem List Items Addressed This Visit       Cardiovascular and Mediastinum   Essential hypertension - Primary    Stop amlodipine to see if pedal edema improves Increase losartan to 50 mg once daily, may  need to increase to 100 mg depending on blood pressure readings.        Relevant Medications   losartan (COZAAR) 50 MG tablet   Other Relevant Orders   Basic metabolic panel   Microalbumin / creatinine urine ratio     Endocrine   Type 2 diabetes mellitus with diabetic neuropathy, unspecified ([place])    Order hga1c today, pending results. Work on diabetic diet and exercise as tolerated. Yearly foot exam, and annual eye exam (referral placed today for annual eye exam)       Relevant Medications   losartan (COZAAR) 50 MG tablet     Other   HLD (hyperlipidemia)    Continue atorvastatin 80 mg once daily  Ordered lipid panel, pending results. Work on low cholesterol diet and exercise as tolerated       Relevant Medications   losartan (COZAAR) 50 MG tablet   Other Relevant Orders   Lipid panel   Other Visit Diagnoses     Controlled type 2 diabetes mellitus without complication, without long-term current use of insulin (HCC)       Relevant Medications   losartan (COZAAR) 50 MG tablet   Other Relevant Orders   Hemoglobin A1c  Basic metabolic panel   Ambulatory referral to Ophthalmology       Meds ordered this encounter  Medications   losartan (COZAAR) 50 MG tablet    Sig: Take 1 tablet (50 mg total) by mouth daily.    Dispense:  90 tablet    Refill:  1    Order Specific Question:   Supervising Provider    Answer:   Diona Browner, AMY E [[id number]]    Follow-up: Return in about 3 months (around 05/09/2022) for f/u blood pressure.    Eugenia Pancoast, FNP

## 2022-02-06 NOTE — Assessment & Plan Note (Signed)
Continue atorvastatin 80 mg once daily  Ordered lipid panel, pending results. Work on low cholesterol diet and exercise as tolerated

## 2022-03-07 ENCOUNTER — Telehealth: Payer: Self-pay | Admitting: Orthopaedic Surgery

## 2022-03-07 NOTE — Telephone Encounter (Signed)
Called pt 1X and pt had no voicemail set up. Pt sent a mychart message for right knee cortisone injection. Pt can not have another injection form 3 months from last visit date

## 2022-03-27 ENCOUNTER — Telehealth: Payer: Self-pay | Admitting: Physician Assistant

## 2022-03-27 NOTE — Telephone Encounter (Signed)
Called patient per her mychart message needing to schedule an appointment for her left knee for cortisone injection. Got recording mailbox has not been set up yet. Called patient back and someone picked up and hung up the phone. Called back and it went into the same voicemail message

## 2022-04-25 ENCOUNTER — Encounter: Payer: Self-pay | Admitting: Family

## 2022-04-25 ENCOUNTER — Ambulatory Visit (INDEPENDENT_AMBULATORY_CARE_PROVIDER_SITE_OTHER): Payer: Medicare HMO | Admitting: Family

## 2022-04-25 VITALS — BP 138/78 | HR 64 | Temp 98.4°F | Ht 70.0 in | Wt 246.4 lb

## 2022-04-25 DIAGNOSIS — M19071 Primary osteoarthritis, right ankle and foot: Secondary | ICD-10-CM | POA: Diagnosis not present

## 2022-04-25 DIAGNOSIS — M19032 Primary osteoarthritis, left wrist: Secondary | ICD-10-CM

## 2022-04-25 DIAGNOSIS — M19031 Primary osteoarthritis, right wrist: Secondary | ICD-10-CM | POA: Diagnosis not present

## 2022-04-25 DIAGNOSIS — M19072 Primary osteoarthritis, left ankle and foot: Secondary | ICD-10-CM

## 2022-04-25 DIAGNOSIS — I10 Essential (primary) hypertension: Secondary | ICD-10-CM

## 2022-04-25 DIAGNOSIS — R519 Headache, unspecified: Secondary | ICD-10-CM | POA: Insufficient documentation

## 2022-04-25 DIAGNOSIS — E876 Hypokalemia: Secondary | ICD-10-CM

## 2022-04-25 LAB — SEDIMENTATION RATE: Sed Rate: 22 mm/hr (ref 0–30)

## 2022-04-25 LAB — BASIC METABOLIC PANEL
BUN: 11 mg/dL (ref 6–23)
CO2: 26 mEq/L (ref 19–32)
Calcium: 9.7 mg/dL (ref 8.4–10.5)
Chloride: 101 mEq/L (ref 96–112)
Creatinine, Ser: 0.85 mg/dL (ref 0.40–1.20)
GFR: 70.91 mL/min (ref >60.00)
Glucose, Bld: 130 mg/dL — ABNORMAL HIGH (ref 70–99)
Potassium: 3.3 mEq/L — ABNORMAL LOW (ref 3.5–5.1)
Sodium: 139 mEq/L (ref 135–145)

## 2022-04-25 LAB — TSH: TSH: 1.51 u[IU]/mL (ref 0.35–5.50)

## 2022-04-25 MED ORDER — LOSARTAN POTASSIUM 100 MG PO TABS: 100.0000 mg | ORAL_TABLET | Freq: Every day | ORAL | 1 refills | Status: AC

## 2022-04-25 NOTE — Assessment & Plan Note (Signed)
Recheck of BP during visit 140/90. Will increase Losartan to '100mg'$  daily. Continue HCTZ '25mg'$ . Record BP at home. Goal of less than 130/90. Order placed for BMP. Continue low sodium diet.

## 2022-04-25 NOTE — Assessment & Plan Note (Addendum)
Recommended patient take OTC Ibuprofen or Tylenol as needed. Patient to notify provider if headaches increase in frequency and/or intensity. Increase water intake.

## 2022-04-25 NOTE — Progress Notes (Signed)
Established Patient Office Visit  Subjective:      CC:  Chief Complaint  Patient presents with   Joint Swelling    HPI: Olivia Kirk is a 67 y.o. female presenting on 04/25/2022 for Joint Swelling . HTN:   Does report some headaches over the last one week. She thinks that maybe could be stress related as her daughter was in a car accident recently. Not light sensitive , not nauseous. Headache was on the top of her head, lasted for a few hours, and the only way for it to subside was to go to sleep.   New complaints:  Still with ongoing pedal edema, stopped amlodipine 5 mg once daily, and has noticed a slight improvement but now it is back. She has swelling left ankle and at times the left wrist. Not today with swelling on left wrist. Bil wrists were tender and swollen three days ago. Doesn't take anything otc for this. She sits down often at work and notices the swelling in the ankle as well.     Social history:  Relevant past medical, surgical, family and social history reviewed and updated as indicated. Interim medical history since our last visit reviewed.  Allergies and medications reviewed and updated.  DATA REVIEWED: CHART IN EPIC     ROS: Negative unless specifically indicated above in HPI.    Current Outpatient Medications:    atorvastatin (LIPITOR) 80 MG tablet, Take 1 tablet (80 mg total) by mouth daily., Disp: 90 tablet, Rfl: 1   Bioflavonoid Products (BIOFLEX PO), Take by mouth daily., Disp: , Rfl:    Biotin 1000 MCG tablet, Take by mouth daily., Disp: , Rfl:    Calcium Carbonate (CALCIUM 600 PO), Take by mouth daily., Disp: , Rfl:    Cholecalciferol (VITAMIN D3) 1000 units CAPS, Take by mouth., Disp: , Rfl:    hydrochlorothiazide (HYDRODIURIL) 25 MG tablet, Take 1 tablet (25 mg total) by mouth daily., Disp: 90 tablet, Rfl: 1   losartan (COZAAR) 100 MG tablet, Take 1 tablet (100 mg total) by mouth daily., Disp: 90 tablet, Rfl: 1   metFORMIN (GLUCOPHAGE  XR) 500 MG 24 hr tablet, Take 1 tablet (500 mg total) by mouth daily with breakfast., Disp: 90 tablet, Rfl: 1   metoprolol succinate (TOPROL-XL) 100 MG 24 hr tablet, Take with or immediately following a meal., Disp: 90 tablet, Rfl: 1   Multiple Vitamins-Minerals (ALIVE WOMENS 50+ PO), Take by mouth daily., Disp: , Rfl:    Omega-3 Fatty Acids (FISH OIL) 1000 MG CAPS, Take by mouth daily., Disp: , Rfl:    sertraline (ZOLOFT) 100 MG tablet, Take 1 tablet (100 mg total) by mouth daily., Disp: 90 tablet, Rfl: 1   vitamin B-12 (CYANOCOBALAMIN) 1000 MCG tablet, Take 1,000 mcg by mouth daily., Disp: , Rfl:       Objective:    BP 138/78   Pulse 64   Temp 98.4 F (36.9 C) (Temporal)   Ht '5\' 10"'$  (1.778 m)   Wt 246 lb 6.4 oz (111.8 kg)   SpO2 98%   BMI 35.35 kg/m   Wt Readings from Last 3 Encounters:  04/25/22 246 lb 6.4 oz (111.8 kg)  02/06/22 248 lb (112.5 kg)  12/07/21 250 lb 8 oz (113.6 kg)    Physical Exam Constitutional:      General: She is not in acute distress.    Appearance: Normal appearance. She is obese. She is not ill-appearing, toxic-appearing or diaphoretic.  HENT:  Head: Normocephalic.  Cardiovascular:     Rate and Rhythm: Normal rate and regular rhythm.     Comments: Swelling bil lateral ankles , spongy no pitting Pulmonary:     Effort: Pulmonary effort is normal.     Breath sounds: Normal breath sounds.  Musculoskeletal:        General: Normal range of motion.     Right wrist: Swelling (slight nontender effusion anterior wrist base of hand) present.     Left wrist: Swelling (slight nontender effusion anterior wrist base of hand) present.  Neurological:     General: No focal deficit present.     Mental Status: She is alert and oriented to person, place, and time. Mental status is at baseline.  Psychiatric:        Mood and Affect: Mood normal.        Behavior: Behavior normal.        Thought Content: Thought content normal.        Judgment: Judgment normal.             Assessment & Plan:  Hypokalemia -     Basic metabolic panel  Essential hypertension Assessment & Plan: Recheck of BP during visit 140/90. Will increase Losartan to '100mg'$  daily. Continue HCTZ '25mg'$ . Record BP at home. Goal of less than 130/90. Order placed for BMP. Continue low sodium diet.   Orders: -     Losartan Potassium; Take 1 tablet (100 mg total) by mouth daily.  Dispense: 90 tablet; Refill: 1 -     TSH  Intractable headache, unspecified chronicity pattern, unspecified headache type Assessment & Plan: Recommended patient take OTC Ibuprofen or Tylenol as needed. Patient to notify provider if headaches increase in frequency and/or intensity. Increase water intake.   Orders: -     Sedimentation rate -     TSH  Primary osteoarthritis of both ankles Assessment & Plan: Advised patient to take Ibuprofen/tylenol  as needed. Recommended patient to start wearing compression socks and to elevate lower extremities when able to help with swelling. Can consider melodic in the future if needed.    Primary osteoarthritis of both wrists Assessment & Plan: Advised to take Ibuprofen as needed. Recommended patient wear wrist braces at night.   I evaluated the patient,  was consulted regarding plans for treatment of care, and agree with the assessment and plan per Joya Gaskins, RN, DNP student.  Eugenia Pancoast, FNP-C       Return in about 3 months (around 07/24/2022) for f/u blood pressure.  Eugenia Pancoast, MSN, APRN, FNP-C [place]

## 2022-04-25 NOTE — Patient Instructions (Signed)
  Stop by the lab prior to leaving today. I will notify you of your results once received.   Ibuprofen/tylenol as needed for joint pains and or joint swelling.   Wear compression stockings daily.  Wear wrist braces at night on both wrists.  Elevate legs at night.    Regards,   Eugenia Pancoast FNP-C

## 2022-04-25 NOTE — Assessment & Plan Note (Addendum)
Advised to take Ibuprofen as needed. Recommended patient wear wrist braces at night.   I evaluated the patient,  was consulted regarding plans for treatment of care, and agree with the assessment and plan per Joya Gaskins, RN, DNP student.  -Eugenia Pancoast, FNP-C

## 2022-04-25 NOTE — Progress Notes (Signed)
Established Patient Office Visit  Subjective:   Patient ID: Olivia Kirk, female    DOB: 14-May-1955  Age: 67 y.o. MRN: [id number]  CC:  Chief Complaint  Patient presents with   Joint Swelling    HPI: Olivia Kirk is a 67 y.o. female presenting today for joint swelling.  Patient reports to have continued pedal edema as well as edema in both of her  wrist. She does not have any pain associated with the swelling. Denies numbness/tingling. She notices ankles swell more when she is sitting for long periods of time.  Patient also reports new headaches that started last week. She states she had two episodes of severe head pain at the top of her head. She states headaches lasted a couple of hours and was relieved by her going to sleep. Denies having changes in vision, sensitivity to light, nausea/vomiting. She did not take any medication for the headache. She does report that during the time she had the headaches her level of stress was increased due to he daughter being in a car accident.  Patient has stopped taking Amlodipine '5mg'$  and is currently taking Losartan '50mg'$  and HCTZ '25mg'$ . Patient has not being taking record of blood pressures at home. She reports that she has been sticking to a low sodium diet and having adequate water intake. Denies chest pain, palpitations, shortness of breath.      ROS: Negative unless specifically indicated above in HPI.   Relevant past medical history reviewed and updated as indicated.   Allergies and medications reviewed and updated.   Current Outpatient Medications:    atorvastatin (LIPITOR) 80 MG tablet, Take 1 tablet (80 mg total) by mouth daily., Disp: 90 tablet, Rfl: 1   Bioflavonoid Products (BIOFLEX PO), Take by mouth daily., Disp: , Rfl:    Biotin 1000 MCG tablet, Take by mouth daily., Disp: , Rfl:    Calcium Carbonate (CALCIUM 600 PO), Take by mouth daily., Disp: , Rfl:    Cholecalciferol (VITAMIN D3) 1000 units CAPS, Take by mouth.,  Disp: , Rfl:    hydrochlorothiazide (HYDRODIURIL) 25 MG tablet, Take 1 tablet (25 mg total) by mouth daily., Disp: 90 tablet, Rfl: 1   losartan (COZAAR) 100 MG tablet, Take 1 tablet (100 mg total) by mouth daily., Disp: 90 tablet, Rfl: 1   metFORMIN (GLUCOPHAGE XR) 500 MG 24 hr tablet, Take 1 tablet (500 mg total) by mouth daily with breakfast., Disp: 90 tablet, Rfl: 1   metoprolol succinate (TOPROL-XL) 100 MG 24 hr tablet, Take with or immediately following a meal., Disp: 90 tablet, Rfl: 1   Multiple Vitamins-Minerals (ALIVE WOMENS 50+ PO), Take by mouth daily., Disp: , Rfl:    Omega-3 Fatty Acids (FISH OIL) 1000 MG CAPS, Take by mouth daily., Disp: , Rfl:    sertraline (ZOLOFT) 100 MG tablet, Take 1 tablet (100 mg total) by mouth daily., Disp: 90 tablet, Rfl: 1   vitamin B-12 (CYANOCOBALAMIN) 1000 MCG tablet, Take 1,000 mcg by mouth daily., Disp: , Rfl:   Allergies  Allergen Reactions   Guaifenesin & Derivatives Itching, Dermatitis and Rash   Blueberry Flavor Itching and Rash    Food allergy to blueberry    Objective:   BP 138/78   Pulse 64   Temp 98.4 F (36.9 C) (Temporal)   Ht '5\' 10"'$  (1.778 m)   Wt 246 lb 6.4 oz (111.8 kg)   SpO2 98%   BMI 35.35 kg/m    Physical Exam Vitals reviewed.  Constitutional:  Appearance: She is obese.  HENT:     Head: Normocephalic.  Cardiovascular:     Rate and Rhythm: Normal rate and regular rhythm.     Pulses: Normal pulses.          Radial pulses are 2+ on the right side and 2+ on the left side.       Dorsalis pedis pulses are 2+ on the right side and 2+ on the left side.       Posterior tibial pulses are 2+ on the right side and 2+ on the left side.     Heart sounds: Normal heart sounds. No murmur heard. Pulmonary:     Effort: Pulmonary effort is normal.     Breath sounds: Normal breath sounds.  Musculoskeletal:     Right lower leg: 1+ Edema present.     Left lower leg: 1+ Edema present.  Skin:    General: Skin is warm and dry.      Capillary Refill: Capillary refill takes less than 2 seconds.  Neurological:     Mental Status: She is alert.  Psychiatric:        Mood and Affect: Mood normal.        Thought Content: Thought content normal.        Judgment: Judgment normal.     Assessment & Plan:  Hypokalemia -     Basic metabolic panel  Essential hypertension Assessment & Plan: Recheck of BP during visit 140/90. Will increase Losartan to '100mg'$  daily. Continue HCTZ '25mg'$ . Record BP at home. Goal of less than 130/90. Order placed for BMP. Continue low sodium diet.   Orders: -     Losartan Potassium; Take 1 tablet (100 mg total) by mouth daily.  Dispense: 90 tablet; Refill: 1 -     TSH  Intractable headache, unspecified chronicity pattern, unspecified headache type Assessment & Plan: Recommended patient take OTC Ibuprofen or Tylenol as needed. Patient to notify provider if headaches increase in frequency and/or intensity.   Orders: -     Sedimentation rate -     TSH  Primary osteoarthritis of both ankles Assessment & Plan: Advised patient to take Ibuprofen as needed. Recommended patient to start wearing compression socks and to elevate lower extremities when able to help with swelling.    Primary osteoarthritis of both wrists Assessment & Plan: Advised to take Ibuprofen as needed. Recommended patient wear wrist braces at night.       Follow up plan: Return in about 3 months (around 07/24/2022) for f/u blood pressure.  Johny Shock, RN AGNP-student

## 2022-04-25 NOTE — Assessment & Plan Note (Addendum)
Advised patient to take Ibuprofen/tylenol  as needed. Recommended patient to start wearing compression socks and to elevate lower extremities when able to help with swelling. Can consider melodic in the future if needed.

## 2022-07-11 ENCOUNTER — Other Ambulatory Visit: Payer: Self-pay

## 2022-07-11 DIAGNOSIS — E119 Type 2 diabetes mellitus without complications: Secondary | ICD-10-CM

## 2022-07-11 DIAGNOSIS — E785 Hyperlipidemia, unspecified: Secondary | ICD-10-CM

## 2022-07-11 DIAGNOSIS — I10 Essential (primary) hypertension: Secondary | ICD-10-CM

## 2022-07-11 DIAGNOSIS — F419 Anxiety disorder, unspecified: Secondary | ICD-10-CM

## 2022-07-11 NOTE — Telephone Encounter (Signed)
Refill requests: amLODipine (NORVASC) 5 MG tablet,  atorvastatin (LIPITOR) 80 MG tablet ,   hydrochlorothiazide (HYDRODIURIL) 25 MG tablet,   metFORMIN (GLUCOPHAGE XR) 500 MG 24 hr tablet,   metoprolol succinate (TOPROL-XL) 100 MG 24 hr tablet,  sertraline (ZOLOFT) 100 MG tablet          LV- 04/25/22 NV- not scheduled Last filled by Dr Selena Batten.

## 2022-07-12 MED ORDER — METFORMIN HCL ER 500 MG PO TB24: 500.0000 mg | ORAL_TABLET | Freq: Every day | ORAL | 1 refills | Status: DC

## 2022-07-12 MED ORDER — METOPROLOL SUCCINATE ER 100 MG PO TB24: ORAL_TABLET | ORAL | 1 refills | Status: DC

## 2022-07-12 MED ORDER — SERTRALINE HCL 100 MG PO TABS: 100.0000 mg | ORAL_TABLET | Freq: Every day | ORAL | 1 refills | Status: DC

## 2022-07-12 MED ORDER — AMLODIPINE BESYLATE 5 MG PO TABS
5.0000 mg | ORAL_TABLET | Freq: Every day | ORAL | 3 refills | Status: DC
Start: 2022-07-12 — End: 2023-04-03

## 2022-07-12 MED ORDER — HYDROCHLOROTHIAZIDE 25 MG PO TABS
25.0000 mg | ORAL_TABLET | Freq: Every day | ORAL | 1 refills | Status: DC
Start: 2022-07-12 — End: 2023-01-19

## 2022-07-12 MED ORDER — ATORVASTATIN CALCIUM 80 MG PO TABS: 80.0000 mg | ORAL_TABLET | Freq: Every day | ORAL | 1 refills | Status: DC

## 2022-07-13 ENCOUNTER — Telehealth: Payer: Self-pay

## 2022-08-22 ENCOUNTER — Other Ambulatory Visit: Payer: Self-pay

## 2022-08-22 ENCOUNTER — Ambulatory Visit (LOCAL_COMMUNITY_HEALTH_CENTER): Payer: Self-pay

## 2022-08-22 DIAGNOSIS — Z111 Encounter for screening for respiratory tuberculosis: Secondary | ICD-10-CM

## 2022-08-24 NOTE — Telephone Encounter (Signed)
dup

## 2022-08-25 ENCOUNTER — Ambulatory Visit (LOCAL_COMMUNITY_HEALTH_CENTER): Payer: Self-pay

## 2022-08-25 DIAGNOSIS — Z111 Encounter for screening for respiratory tuberculosis: Secondary | ICD-10-CM

## 2022-08-25 LAB — TB SKIN TEST
Induration: 0 mm
TB Skin Test: NEGATIVE

## 2022-09-06 DIAGNOSIS — M25562 Pain in left knee: Secondary | ICD-10-CM | POA: Diagnosis not present

## 2022-09-06 DIAGNOSIS — M25561 Pain in right knee: Secondary | ICD-10-CM | POA: Diagnosis not present

## 2022-09-06 DIAGNOSIS — M170 Bilateral primary osteoarthritis of knee: Secondary | ICD-10-CM | POA: Insufficient documentation

## 2022-11-01 ENCOUNTER — Encounter: Payer: Self-pay | Admitting: Pharmacist

## 2022-11-01 NOTE — Progress Notes (Signed)
Pharmacy Quality Measure Review  This patient is appearing on a report for being at risk of failing the adherence measure for cholesterol (statin) medications this calendar year.   Medication: atorvastatin 80 Last fill date: 7/28 for 90 day supply  Insurance report was not up to date. No action needed at this time.   Catie Eppie Gibson, PharmD, BCACP, CPP Clinical Pharmacist [facility] [phone]

## 2022-12-12 ENCOUNTER — Telehealth: Payer: Self-pay

## 2022-12-12 NOTE — Telephone Encounter (Signed)
Patient is on list for Quality care gaps. She also was due for follow up in May with PCP for follow up on blood pressure. I have called left message to call office. Will need to set up follow up and review gaps.

## 2022-12-14 NOTE — Telephone Encounter (Signed)
Patient has been scheduled

## 2022-12-19 ENCOUNTER — Encounter: Payer: Self-pay | Admitting: Family

## 2022-12-19 ENCOUNTER — Ambulatory Visit (INDEPENDENT_AMBULATORY_CARE_PROVIDER_SITE_OTHER): Payer: Medicare HMO | Admitting: Family

## 2022-12-19 VITALS — BP 126/89 | HR 65 | Temp 97.3°F | Ht 70.0 in | Wt 250.8 lb

## 2022-12-19 DIAGNOSIS — E1140 Type 2 diabetes mellitus with diabetic neuropathy, unspecified: Secondary | ICD-10-CM | POA: Diagnosis not present

## 2022-12-19 DIAGNOSIS — Z23 Encounter for immunization: Secondary | ICD-10-CM | POA: Diagnosis not present

## 2022-12-19 DIAGNOSIS — E782 Mixed hyperlipidemia: Secondary | ICD-10-CM

## 2022-12-19 DIAGNOSIS — I10 Essential (primary) hypertension: Secondary | ICD-10-CM | POA: Diagnosis not present

## 2022-12-19 DIAGNOSIS — Z7984 Long term (current) use of oral hypoglycemic drugs: Secondary | ICD-10-CM | POA: Diagnosis not present

## 2022-12-19 LAB — LIPID PANEL
Cholesterol: 165 mg/dL (ref 0–200)
HDL: 44 mg/dL (ref >39.00)
LDL Cholesterol: 88 mg/dL (ref 0–99)
NonHDL: 121.06
Total CHOL/HDL Ratio: 4
Triglycerides: 166 mg/dL — ABNORMAL HIGH (ref 0.0–149.0)
VLDL: 33.2 mg/dL (ref 0.0–40.0)

## 2022-12-19 LAB — POCT GLYCOSYLATED HEMOGLOBIN (HGB A1C): Hemoglobin A1C: 6.5 % — AB (ref 4.0–5.6)

## 2022-12-19 LAB — MICROALBUMIN / CREATININE URINE RATIO
Creatinine,U: 113 mg/dL
Microalb Creat Ratio: 0.6 mg/g (ref 0.0–30.0)
Microalb, Ur: <0.7 mg/dL (ref 0.0–1.9)

## 2022-12-19 MED ORDER — OZEMPIC (0.25 OR 0.5 MG/DOSE) 2 MG/3ML ~~LOC~~ SOPN
PEN_INJECTOR | SUBCUTANEOUS | 1 refills | Status: DC
Start: 2022-12-19 — End: 2023-05-28

## 2022-12-19 MED ORDER — SEMAGLUTIDE (1 MG/DOSE) 4 MG/3ML ~~LOC~~ SOPN
PEN_INJECTOR | SUBCUTANEOUS | 0 refills | Status: DC
Start: 2023-02-06 — End: 2023-05-28

## 2022-12-19 NOTE — Patient Instructions (Addendum)
  Start 0.25 mg once weekly ozempic for four weeks If tolerating increase to 0.5 mg weekly for six weeks.  If tolerating can then increase to 1 mg weekly   A referral was placed today for nutritionist.  Please let us know if you have not heard back within 2 weeks about the referral.   Regards,   Felicha Frayne FNP-C

## 2022-12-19 NOTE — Progress Notes (Signed)
Established Patient Office Visit  Subjective:      CC:  Chief Complaint  Patient presents with   Medical Management of Chronic Issues    HPI: Olivia Kirk is a 67 y.o. female presenting on 12/19/2022 for Medical Management of Chronic Issues .   New complaints: Concerned over weight and is find it frustrating that she keeps seeming to gain her weight.   Exercise: not motivated to work out at this time, has equipment to exercise which is her biggest issue she states.  Diet: admits to poor diet. Doesn't have time in the morning so she will often skip breakfast. She eats cookies and candies especially at night time, and sometimes chips.   Wt Readings from Last 3 Encounters:  12/19/22 250 lb 12.8 oz (113.8 kg)  04/25/22 246 lb 6.4 oz (111.8 kg)  02/06/22 248 lb (112.5 kg)   DM2: taking metformin 500 mg XR once daily. Lab Results  Component Value Date   HGBA1C 6.5 (A) 12/19/2022          Social history:  Relevant past medical, surgical, family and social history reviewed and updated as indicated. Interim medical history since our last visit reviewed.  Allergies and medications reviewed and updated.  DATA REVIEWED: CHART IN EPIC     ROS: Negative unless specifically indicated above in HPI.    Current Outpatient Medications:    amLODipine (NORVASC) 5 MG tablet, Take 1 tablet (5 mg total) by mouth daily., Disp: 90 tablet, Rfl: 3   atorvastatin (LIPITOR) 80 MG tablet, Take 1 tablet (80 mg total) by mouth daily., Disp: 90 tablet, Rfl: 1   Bioflavonoid Products (BIOFLEX PO), Take by mouth daily., Disp: , Rfl:    Biotin 1000 MCG tablet, Take by mouth daily., Disp: , Rfl:    Calcium Carbonate (CALCIUM 600 PO), Take by mouth daily., Disp: , Rfl:    Cholecalciferol (VITAMIN D3) 1000 units CAPS, Take by mouth., Disp: , Rfl:    hydrochlorothiazide (HYDRODIURIL) 25 MG tablet, Take 1 tablet (25 mg total) by mouth daily., Disp: 90 tablet, Rfl: 1   losartan (COZAAR)  100 MG tablet, Take 1 tablet (100 mg total) by mouth daily., Disp: 90 tablet, Rfl: 1   metFORMIN (GLUCOPHAGE XR) 500 MG 24 hr tablet, Take 1 tablet (500 mg total) by mouth daily with breakfast., Disp: 90 tablet, Rfl: 1   metoprolol succinate (TOPROL-XL) 100 MG 24 hr tablet, Take with or immediately following a meal., Disp: 90 tablet, Rfl: 1   Multiple Vitamins-Minerals (ALIVE WOMENS 50+ PO), Take by mouth daily., Disp: , Rfl:    Omega-3 Fatty Acids (FISH OIL) 1000 MG CAPS, Take by mouth daily., Disp: , Rfl:    [START ON 02/06/2023] Semaglutide, 1 MG/DOSE, 4 MG/3ML SOPN, Inject 1 mg Qweek [place], Disp: 3 mL, Rfl: 0   Semaglutide,0.25 or 0.5MG /DOS, (OZEMPIC, 0.25 OR 0.5 MG/DOSE,) 2 MG/3ML SOPN, Start 0.25 mg weekly [place] for four weeks then increase to 0.5 mg Qweek, Disp: 3 mL, Rfl: 1   sertraline (ZOLOFT) 100 MG tablet, Take 1 tablet (100 mg total) by mouth daily., Disp: 90 tablet, Rfl: 1   vitamin B-12 (CYANOCOBALAMIN) 1000 MCG tablet, Take 1,000 mcg by mouth daily., Disp: , Rfl:       Objective:    BP 126/89   Pulse 65   Temp (!) 97.3 F (36.3 C) (Temporal)   Ht 5\' 10"  (1.778 m)   Wt 250 lb 12.8 oz (113.8 kg)   SpO2 98%  BMI 35.99 kg/m   Wt Readings from Last 3 Encounters:  12/19/22 250 lb 12.8 oz (113.8 kg)  04/25/22 246 lb 6.4 oz (111.8 kg)  02/06/22 248 lb (112.5 kg)    Physical Exam Constitutional:      General: She is not in acute distress.    Appearance: Normal appearance. She is normal weight. She is not ill-appearing, toxic-appearing or diaphoretic.  HENT:     Head: Normocephalic.  Cardiovascular:     Rate and Rhythm: Normal rate and regular rhythm.  Pulmonary:     Effort: Pulmonary effort is normal.     Breath sounds: Normal breath sounds.  Musculoskeletal:        General: Normal range of motion.  Neurological:     General: No focal deficit present.     Mental Status: She is alert and oriented to person, place, and time. Mental status is at baseline.  Psychiatric:         Mood and Affect: Mood normal.        Behavior: Behavior normal.        Thought Content: Thought content normal.        Judgment: Judgment normal.           Assessment & Plan:  Encounter for immunization -     Flu Vaccine Trivalent High Dose (Fluad) -     Amb Referral to Nutrition and Diabetic Education  Type 2 diabetes mellitus with diabetic neuropathy, without long-term current use of insulin (HCC) Assessment & Plan: A1c in office today reviewed and there is some improvement from last reading.  Long discussion with patient on diabetic diet and increasing exercise as tolerated.  Referral placed for diabetic nutritionist as patient thinks that this would be helpful as well as ongoing.  Going to start Ozempic 0.25 mg once weekly for 4 weeks and then we will titrate upward if tolerated.  Continue metformin 500 mg XR once daily.  Urine microalbumin has not been completed this year ordered and pending results.  Orders: -     Microalbumin / creatinine urine ratio -     Amb Referral to Nutrition and Diabetic Education -     POCT glycosylated hemoglobin (Hb A1C) -     Ozempic (0.25 or 0.5 MG/DOSE); Start 0.25 mg weekly [place] for four weeks then increase to 0.5 mg Qweek  Dispense: 3 mL; Refill: 1 -     Semaglutide (1 MG/DOSE); Inject 1 mg Qweek [place]  Dispense: 3 mL; Refill: 0  Essential hypertension Assessment & Plan: Stable patient to continue metoprolol 100 mg XL once daily, losartan 100 mg tablet once daily, amlodipine 5 mg once daily.  Maintain low-sodium diet.  Orders: -     Microalbumin / creatinine urine ratio -     Amb Referral to Nutrition and Diabetic Education  Mixed hyperlipidemia Assessment & Plan: Patient trying to actively work on her diet at current.  Continue low-cholesterol diet and exercise as tolerated.  Continue atorvastatin 80 mg once daily.  Lipid panel ordered and pending results  Orders: -     Lipoprotein A (LPA) -     Lipid panel -     Microalbumin /  creatinine urine ratio -     Amb Referral to Nutrition and Diabetic Education     Return in about 10 weeks (around 02/27/2023) for f/u diabetes.  Mort Sawyers, MSN, APRN, FNP-C [facility]

## 2022-12-19 NOTE — Assessment & Plan Note (Signed)
A1c in office today reviewed and there is some improvement from last reading.  Long discussion with patient on diabetic diet and increasing exercise as tolerated.  Referral placed for diabetic nutritionist as patient thinks that this would be helpful as well as ongoing.  Going to start Ozempic 0.25 mg once weekly for 4 weeks and then we will titrate upward if tolerated.  Continue metformin 500 mg XR once daily.  Urine microalbumin has not been completed this year ordered and pending results.

## 2022-12-19 NOTE — Assessment & Plan Note (Signed)
Stable patient to continue metoprolol 100 mg XL once daily, losartan 100 mg tablet once daily, amlodipine 5 mg once daily.  Maintain low-sodium diet.

## 2022-12-19 NOTE — Assessment & Plan Note (Signed)
Patient trying to actively work on her diet at current.  Continue low-cholesterol diet and exercise as tolerated.  Continue atorvastatin 80 mg once daily.  Lipid panel ordered and pending results

## 2022-12-22 LAB — LIPOPROTEIN A (LPA): Lipoprotein (a): 121 nmol/L — ABNORMAL HIGH (ref <75)

## 2023-01-18 ENCOUNTER — Other Ambulatory Visit: Payer: Self-pay | Admitting: Family

## 2023-01-18 DIAGNOSIS — F419 Anxiety disorder, unspecified: Secondary | ICD-10-CM

## 2023-01-18 DIAGNOSIS — E119 Type 2 diabetes mellitus without complications: Secondary | ICD-10-CM

## 2023-01-18 DIAGNOSIS — I10 Essential (primary) hypertension: Secondary | ICD-10-CM

## 2023-01-18 DIAGNOSIS — E785 Hyperlipidemia, unspecified: Secondary | ICD-10-CM

## 2023-01-26 ENCOUNTER — Telehealth: Payer: Self-pay

## 2023-01-26 DIAGNOSIS — Z1231 Encounter for screening mammogram for malignant neoplasm of breast: Secondary | ICD-10-CM

## 2023-01-26 DIAGNOSIS — Z1211 Encounter for screening for malignant neoplasm of colon: Secondary | ICD-10-CM

## 2023-01-26 NOTE — Telephone Encounter (Signed)
Health Maintenance Due  Topic Date Due   OPHTHALMOLOGY EXAM  Never done   Zoster Vaccines- Shingrix (1 of 2) 12/04/2004   DTaP/Tdap/Td (2 - Td or Tdap) 05/13/2022   Colonoscopy  06/04/2022   MAMMOGRAM  07/07/2022   COVID-19 Vaccine (3 - 2023-24 season) 11/19/2022   FOOT EXAM  12/08/2022   Medicare Annual Wellness (AWV)  12/08/2022    Patient has following gaps open. I have reviewed [facility] and immunizations are updated. Ok to call patient and verify where mammogram and colonoscopy orders are needed to be sent to? If patient agrees ok to send orders?

## 2023-01-29 NOTE — Telephone Encounter (Signed)
Spoke to patient requested mammogram when at or office and referral to GI in [place].  Scheduled Mammogram with patient when on the phone. Advised if no call received by Gastroenterology in 2 weeks let us know.

## 2023-01-29 NOTE — Telephone Encounter (Signed)
Yes this is fine.

## 2023-02-05 ENCOUNTER — Telehealth: Payer: Self-pay

## 2023-02-05 ENCOUNTER — Other Ambulatory Visit: Payer: Self-pay | Admitting: *Deleted

## 2023-02-05 ENCOUNTER — Telehealth: Payer: Self-pay | Admitting: *Deleted

## 2023-02-05 DIAGNOSIS — Z1211 Encounter for screening for malignant neoplasm of colon: Secondary | ICD-10-CM

## 2023-02-05 MED ORDER — PEG 3350-KCL-NABCB-NACL-NASULF 236 G PO SOLR: 4000.0000 mL | Freq: Once | ORAL | 0 refills | Status: AC

## 2023-02-05 MED ORDER — PEG 3350-KCL-NABCB-NACL-NASULF 236 G PO SOLR: 4000.0000 mL | Freq: Once | ORAL | 0 refills | Status: DC

## 2023-02-05 MED ORDER — CLENPIQ 10-3.5-12 MG-GM -GM/175ML PO SOLN: 1.0000 | Freq: Once | ORAL | 0 refills | Status: DC

## 2023-02-05 NOTE — Telephone Encounter (Signed)
Colonoscopy schedule with Dr Allegra Lai on 02/20/2023

## 2023-02-05 NOTE — Telephone Encounter (Signed)
Patient called back to schedule colonoscopy.  ?

## 2023-02-05 NOTE — Telephone Encounter (Signed)
Gastroenterology Pre-Procedure Review  Request Date: 02/20/2023  Requesting Physician: Dr. Allegra Lai  PATIENT REVIEW QUESTIONS: The patient responded to the following health history questions as indicated:    1. Are you having any GI issues? no 2. Do you have a personal history of Polyps? no 3. Do you have a family history of Colon Cancer or Polyps? no 4. Diabetes Mellitus? yes (taking metformin and Ozempic) 5. Joint replacements in the past 12 months?no 6. Major health problems in the past 3 months?no 7. Any artificial heart valves, MVP, or defibrillator?no    MEDICATIONS & ALLERGIES:    Patient reports the following regarding taking any anticoagulation/antiplatelet therapy:   Plavix, Coumadin, Eliquis, Xarelto, Lovenox, Pradaxa, Brilinta, or Effient? no Aspirin? no  Patient confirms/reports the following medications:  Current Outpatient Medications  Medication Sig Dispense Refill   amLODipine (NORVASC) 5 MG tablet Take 1 tablet (5 mg total) by mouth daily. 90 tablet 3   atorvastatin (LIPITOR) 80 MG tablet Take 1 tablet by mouth once daily 90 tablet 1   Bioflavonoid Products (BIOFLEX PO) Take by mouth daily.     Biotin 1000 MCG tablet Take by mouth daily.     Calcium Carbonate (CALCIUM 600 PO) Take by mouth daily.     Cholecalciferol (VITAMIN D3) 1000 units CAPS Take by mouth.     hydrochlorothiazide (HYDRODIURIL) 25 MG tablet Take 1 tablet by mouth once daily 90 tablet 1   losartan (COZAAR) 100 MG tablet Take 1 tablet (100 mg total) by mouth daily. 90 tablet 1   metFORMIN (GLUCOPHAGE-XR) 500 MG 24 hr tablet Take 1 tablet by mouth once daily with breakfast 90 tablet 1   metoprolol succinate (TOPROL-XL) 100 MG 24 hr tablet TAKE WITH OR IMMEDIATELY FOLLOWING A MEAL. 90 tablet 1   Multiple Vitamins-Minerals (ALIVE WOMENS 50+ PO) Take by mouth daily.     Omega-3 Fatty Acids (FISH OIL) 1000 MG CAPS Take by mouth daily.     [START ON 02/06/2023] Semaglutide, 1 MG/DOSE, 4 MG/3ML SOPN Inject 1  mg Qweek [place] 3 mL 0   Semaglutide,0.25 or 0.5MG /DOS, (OZEMPIC, 0.25 OR 0.5 MG/DOSE,) 2 MG/3ML SOPN Start 0.25 mg weekly [place] for four weeks then increase to 0.5 mg Qweek 3 mL 1   sertraline (ZOLOFT) 100 MG tablet Take 1 tablet by mouth once daily 90 tablet 1   vitamin B-12 (CYANOCOBALAMIN) 1000 MCG tablet Take 1,000 mcg by mouth daily.     No current facility-administered medications for this visit.    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   AUTHORIZATION INFORMATION Primary Insurance: 1D#: Group #:  Secondary Insurance: 1D#: Group #:  SCHEDULE INFORMATION: Date: 02/20/2023 Time: Location:  [facility]

## 2023-02-08 ENCOUNTER — Telehealth: Payer: Self-pay

## 2023-02-08 NOTE — Telephone Encounter (Signed)
Dr. Allegra Lai is not going to be in the office or be able to do scope on 02/20/2023 and we need to reschedule procedure. Called patient and patient she does not care who does her procedure. Reschedule to 03/01/2023 with Dr. Tobi Bastos. Mailed out new instructions with this date on it. Called endo and talk to [place] and she has moved patient.

## 2023-02-20 ENCOUNTER — Ambulatory Visit (INDEPENDENT_AMBULATORY_CARE_PROVIDER_SITE_OTHER): Payer: Medicare HMO

## 2023-02-20 VITALS — Ht 70.0 in | Wt 250.0 lb

## 2023-02-20 DIAGNOSIS — Z0000 Encounter for general adult medical examination without abnormal findings: Secondary | ICD-10-CM

## 2023-02-20 NOTE — Patient Instructions (Signed)
Olivia Kirk , Thank you for taking time to come for your Medicare Wellness Visit. I appreciate your ongoing commitment to your health goals. Please review the following plan we discussed and let me know if I can assist you in the future.   Referrals/Orders/Follow-Ups/Clinician Recommendations: none  This is a list of the screening recommended for you and due dates:  Health Maintenance  Topic Date Due   Eye exam for diabetics  Never done   Zoster (Shingles) Vaccine (1 of 2) 12/04/2004   DTaP/Tdap/Td vaccine (2 - Td or Tdap) 05/13/2022   Colon Cancer Screening  06/04/2022   Mammogram  07/07/2022   COVID-19 Vaccine (3 - 2023-24 season) 11/19/2022   Complete foot exam   12/08/2022   Yearly kidney function blood test for diabetes  04/26/2023   Hemoglobin A1C  06/19/2023   Yearly kidney health urinalysis for diabetes  12/19/2023   Medicare Annual Wellness Visit  02/20/2024   Pneumonia Vaccine  Completed   Flu Shot  Completed   DEXA scan (bone density measurement)  Completed   Hepatitis C Screening  Completed   HPV Vaccine  Aged Out    Advanced directives: (Copy Requested) Please bring a copy of your health care power of attorney and living will to the office to be added to your chart at your convenience.  Next Medicare Annual Wellness Visit scheduled for next year: Yes 02/21/24 @ 1:40pm telephone

## 2023-02-20 NOTE — Progress Notes (Signed)
Subjective:   Olivia Kirk is a 67 y.o. female who presents for Medicare Annual (Subsequent) preventive examination.  Visit Complete: Virtual I connected with  Olivia Kirk on 02/20/23 by a audio enabled telemedicine application and verified that I am speaking with the correct person using two identifiers.  Patient Location: Home  Provider Location: Office/Clinic  I discussed the limitations of evaluation and management by telemedicine. The patient expressed understanding and agreed to proceed.  Vital Signs: Because this visit was a virtual/telehealth visit, some criteria may be missing or patient reported. Any vitals not documented were not able to be obtained and vitals that have been documented are patient reported.  Patient Medicare AWV questionnaire was completed by the patient on (not done); I have confirmed that all information answered by patient is correct and no changes since this date. Cardiac Risk Factors include: advanced age (>59men, >33 women);diabetes mellitus;dyslipidemia;hypertension;obesity (BMI >30kg/m2)    Objective:    Today's Vitals   02/20/23 1336  Weight: 250 lb (113.4 kg)  Height: 5\' 10"  (1.778 m)  PainSc: 8    Body mass index is 35.87 kg/m.     02/20/2023    1:49 PM 12/07/2021    8:59 AM 07/02/2018   10:35 AM 01/03/2018    2:56 PM 10/01/2017   10:21 AM 11/10/2016   12:51 PM 10/12/2016   10:03 AM  Advanced Directives  Does Patient Have a Medical Advance Directive? Yes No No No No No No  Type of Estate agent of [place];Living will        Copy of Healthcare Power of Attorney in Chart? No - copy requested        Would patient like information on creating a medical advance directive?  Yes (MAU/Ambulatory/Procedural Areas - Information given) No - Patient declined No - Patient declined No - Patient declined  No - Patient declined    Current Medications (verified) Outpatient Encounter Medications as of 02/20/2023   Medication Sig   amLODipine (NORVASC) 5 MG tablet Take 1 tablet (5 mg total) by mouth daily.   atorvastatin (LIPITOR) 80 MG tablet Take 1 tablet by mouth once daily   Bioflavonoid Products (BIOFLEX PO) Take by mouth daily.   Biotin 1000 MCG tablet Take by mouth daily.   Calcium Carbonate (CALCIUM 600 PO) Take by mouth daily.   Cholecalciferol (VITAMIN D3) 1000 units CAPS Take by mouth.   hydrochlorothiazide (HYDRODIURIL) 25 MG tablet Take 1 tablet by mouth once daily   losartan (COZAAR) 100 MG tablet Take 1 tablet (100 mg total) by mouth daily.   metFORMIN (GLUCOPHAGE-XR) 500 MG 24 hr tablet Take 1 tablet by mouth once daily with breakfast   metoprolol succinate (TOPROL-XL) 100 MG 24 hr tablet TAKE WITH OR IMMEDIATELY FOLLOWING A MEAL.   Multiple Vitamins-Minerals (ALIVE WOMENS 50+ PO) Take by mouth daily.   Omega-3 Fatty Acids (FISH OIL) 1000 MG CAPS Take by mouth daily.   Semaglutide, 1 MG/DOSE, 4 MG/3ML SOPN Inject 1 mg Qweek [place]   Semaglutide,0.25 or 0.5MG /DOS, (OZEMPIC, 0.25 OR 0.5 MG/DOSE,) 2 MG/3ML SOPN Start 0.25 mg weekly [place] for four weeks then increase to 0.5 mg Qweek   sertraline (ZOLOFT) 100 MG tablet Take 1 tablet by mouth once daily   vitamin B-12 (CYANOCOBALAMIN) 1000 MCG tablet Take 1,000 mcg by mouth daily.   No facility-administered encounter medications on file as of 02/20/2023.    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   GERD (gastroesophageal reflux disease)    RARE   Hyperlipidemia    Hypertension    Personal history of radiation therapy 2018   left breast cancer   Past Surgical History:  Procedure Laterality Date   BREAST BIOPSY Left 06/06/2016   left stereo. invasive mammary  carcinoma   BREAST LUMPECTOMY Left 07/03/2016   invasive mammary carcinoma   BREAST LUMPECTOMY WITH NEEDLE LOCALIZATION Left 07/03/2016   Procedure: BREAST LUMPECTOMY WITH NEEDLE LOCALIZATION;  Surgeon: Earline Mayotte, MD;  Location: [facility] ORS;  Service: General;  Laterality: Left;   BREAST LUMPECTOMY WITH SENTINEL LYMPH NODE BIOPSY Left 07/03/2016   Procedure: BREAST LUMPECTOMY WITH SENTINEL LYMPH NODE BX;  Surgeon: Earline Mayotte, MD;  Location: [facility] ORS;  Service: General;  Laterality: Left;   hemrrhoid     TUBAL LIGATION     Family History  Problem Relation Age of Onset   Hypertension Mother    Cancer Father        unknown type   Heart disease Father    Breast cancer Paternal Aunt 54   Hypertension Maternal Grandmother    Stroke Maternal Grandmother    Social History   Socioeconomic History   Marital status: Married    Spouse name: Olivia Kirk   Number of children: 4   Years of education: 12   Highest education level: Not on file  Occupational History    Employer: [employer]    Comment: caregiver  Tobacco Use   Smoking status: Former    Current packs/day: 0.00    Average packs/day: 0.3 packs/day for 6.0 years (1.5 ttl pk-yrs)    Types: Cigarettes    Start date: 05/06/1981    Quit date: 05/07/1987    Years since quitting: 35.8   Smokeless tobacco: Never  Vaping Use   Vaping status: Never Used  Substance and Sexual Activity   Alcohol use: Not Currently   Drug use: No   Sexual activity: Yes    Partners: Male    Birth control/protection: Post-menopausal  Other Topics Concern   Not on file  Social History Narrative   09/02/20   From: [place] originally   Living: with Olivia, husband (1982) and Daughter and 2 grandchildren and son on the property   Work: retired, working as caregiver      Family: 4 children - Olivia Kirk, twins - Olivia Kirk and Olivia Kirk, Olivia Kirk - 4 grandchildren       Enjoys: watch movies, go to church      Exercise: not currently   Diet: diabetic  diet, healthy      Safety   Seat belts: Yes    Guns: no    Safe in relationships: Yes    '   Social Determinants of Health   Financial Resource Strain: Low Risk  (02/20/2023)   Overall Financial Resource Strain (CARDIA)    Difficulty of Paying Living Expenses: Not hard at all  Food Insecurity: No Food Insecurity (02/20/2023)   Hunger Vital Sign    Worried About Running Out of Food in the Last Year: Never true    Ran Out of Food in the Last Year: Never true  Transportation Needs: No Transportation Needs (02/20/2023)   PRAPARE -  Administrator, Civil Service (Medical): No    Lack of Transportation (Non-Medical): No  Physical Activity: Sufficiently Active (02/20/2023)   Exercise Vital Sign    Days of Exercise per Week: 5 days    Minutes of Exercise per Session: 60 min  Stress: No Stress Concern Present (02/20/2023)   [employer] of Occupational Health - Occupational Stress Questionnaire    Feeling of Stress : Only a little  Social Connections: Socially Integrated (02/20/2023)   Social Connection and Isolation Panel [NHANES]    Frequency of Communication with Friends and Family: More than three times a week    Frequency of Social Gatherings with Friends and Family: Three times a week    Attends Religious Services: More than 4 times per year    Active Member of Clubs or Organizations: Yes    Attends Engineer, structural: More than 4 times per year    Marital Status: Married    Tobacco Counseling Counseling given: Not Answered   Clinical Intake:  Pre-visit preparation completed: Yes  Pain : 0-10 Pain Score: 8  Pain Type: Chronic pain Pain Location: Knee Pain Orientation: Right Pain Descriptors / Indicators: Aching Pain Onset: More than a month ago Pain Frequency: Intermittent Pain Relieving Factors: heat,Aleive or IBU  Pain Relieving Factors: heat,Aleive or IBU  BMI - recorded: 35.87 Nutritional Status: BMI > 30  Obese Nutritional Risks:  None Diabetes: Yes CBG done?: No Did pt. bring in CBG monitor from home?: No  How often do you need to have someone help you when you read instructions, pamphlets, or other written materials from your doctor or pharmacy?: 1 - Never  Interpreter Needed?: No  Comments: lives with husband Information entered by :: B.Edyn Popoca,LPN   Activities of Daily Living    02/20/2023    1:49 PM  In your present state of health, do you have any difficulty performing the following activities:  Hearing? 0  Vision? 0  Difficulty concentrating or making decisions? 0  Walking or climbing stairs? 0  Dressing or bathing? 0  Doing errands, shopping? 0  Preparing Food and eating ? N  Using the Toilet? N  In the past six months, have you accidently leaked urine? N  Do you have problems with loss of bowel control? N  Managing your Medications? N  Managing your Finances? N  Housekeeping or managing your Housekeeping? N    Patient Care Team: Mort Sawyers, FNP as PCP - General (Family Medicine) Sherie Don Janit Bern, MD as Attending Physician (Family Medicine) Wyline Mood, MD as Consulting Physician (Gastroenterology) Deeann Saint, MD (Orthopedic Surgery)  Indicate any recent Medical Services you may have received from other than [facility] providers in the past year (date may be approximate).     Assessment:   This is a routine wellness examination for Hit[place].  Hearing/Vision screen Hearing Screening - Comments:: Pt says her hearing is good Vision Screening - Comments:: Pt says her hearing is good with glasses Due for eye exam :    Goals Addressed   None    Depression Screen    02/20/2023    1:45 PM 12/19/2022   10:14 AM 04/25/2022    8:47 AM 12/07/2021    9:23 AM 02/25/2020    3:27 PM 08/25/2019    3:15 PM 11/28/2017    3:49 PM  PHQ 2/9 Scores  PHQ - 2 Score 0 1 0 0 0 0 1  PHQ- 9 Score  2 5    3  Fall Risk    02/20/2023    1:40 PM 12/19/2022   10:14 AM 04/25/2022    8:47 AM 12/07/2021     8:45 AM 11/18/2021    8:10 AM  Fall Risk   Falls in the past year? 0 0 1 1 1   Number falls in past yr: 0 0 1 1 1   Injury with Fall? 0 0 0 1 1  Risk for fall due to : No Fall Risks No Fall Risks  History of fall(s)   Follow up Education provided;Falls prevention discussed Falls evaluation completed Falls evaluation completed;Education provided      MEDICARE RISK AT HOME: Medicare Risk at Home Any stairs in or around the home?: Yes If so, are there any without handrails?: Yes Home free of loose throw rugs in walkways, pet beds, electrical cords, etc?: Yes Adequate lighting in your home to reduce risk of falls?: Yes Life alert?: No (has smart watch) Use of a cane, walker or w/c?: No Grab bars in the bathroom?: No Shower chair or bench in shower?: No Elevated toilet seat or a handicapped toilet?: No  TIMED UP AND GO:  Was the test performed?  No    Cognitive Function:        02/20/2023    1:50 PM  6CIT Screen  What Year? 0 points  What month? 0 points  What time? 0 points  Count back from 20 0 points  Months in reverse 0 points  Repeat phrase 0 points  Total Score 0 points    Immunizations Immunization History  Administered Date(s) Administered   Fluad Quad(high Dose 65+) 02/25/2020, 12/06/2020, 12/07/2021   Fluad Trivalent(High Dose 65+) 12/19/2022   Influenza,inj,Quad PF,6+ Mos 12/04/2013, 02/02/2016, 11/28/2017, 12/16/2018   Moderna Sars-Covid-2 Vaccination 05/28/2019, 06/30/2019   PPD Test 06/09/2019, 07/12/2020, 08/22/2022   Pneumococcal Conjugate-13 02/25/2020   Pneumococcal Polysaccharide-23 12/07/2021   Tdap 05/13/2012   Zoster, Live 05/13/2012    TDAP status: Up to date  Flu Vaccine status: Up to date  Pneumococcal vaccine status: Up to date  Covid-19 vaccine status: Completed vaccines  Qualifies for Shingles Vaccine? Yes   Zostavax completed No   Shingrix Completed?: No.    Education has been provided regarding the importance of this vaccine.  Patient has been advised to call insurance company to determine out of pocket expense if they have not yet received this vaccine. Advised may also receive vaccine at local pharmacy or Health Dept. Verbalized acceptance and understanding.  Screening Tests Health Maintenance  Topic Date Due   MAMMOGRAM  07/07/2022   OPHTHALMOLOGY EXAM  02/20/2023 (Originally 12/04/1965)   COVID-19 Vaccine (3 - 2023-24 season) 03/08/2023 (Originally 11/19/2022)   Zoster Vaccines- Shingrix (1 of 2) 05/21/2023 (Originally 12/04/2004)   FOOT EXAM  06/18/2023 (Originally 12/08/2022)   DTaP/Tdap/Td (2 - Td or Tdap) 02/20/2024 (Originally 05/13/2022)   Colonoscopy  02/20/2024 (Originally 06/04/2022)   Diabetic kidney evaluation - eGFR measurement  04/26/2023   HEMOGLOBIN A1C  06/19/2023   Diabetic kidney evaluation - Urine ACR  12/19/2023   Medicare Annual Wellness (AWV)  02/20/2024   Pneumonia Vaccine 77+ Years old  Completed   INFLUENZA VACCINE  Completed   DEXA SCAN  Completed   Hepatitis C Screening  Completed   HPV VACCINES  Aged Out    Health Maintenance  Health Maintenance Due  Topic Date Due   MAMMOGRAM  07/07/2022    Colorectal cancer screening: Type of screening: Colonoscopy. Completed 06/03/2012. Repeat every 10 years scheduled 03/01/23  Mammogram status: Completed 07/06/2020. Repeat every year order placed already-pt to schedule  Bone Density status: Completed 07/06/2020. Results reflect: Bone density results: OSTEOPENIA. Repeat every 3 years.  Lung Cancer Screening: (Low Dose CT Chest recommended if Age 14-80 years, 20 pack-year currently smoking OR have quit w/in 15years.) does not qualify.   Lung Cancer Screening Referral: no  Additional Screening:  Hepatitis C Screening: does not qualify; Completed 02/18/20  Vision Screening: Recommended annual ophthalmology exams for early detection of glaucoma and other disorders of the eye. Is the patient up to date with their annual eye exam?  No  Who is  the provider or what is the name of the office in which the patient attends annual eye exams? Does not remember If pt is not established with a provider, would they like to be referred to a provider to establish care? No .   Dental Screening: Recommended annual dental exams for proper oral hygiene  Diabetic Foot Exam: Diabetic Foot Exam: Overdue, Pt has been advised about the importance in completing this exam. Pt is scheduled for diabetic foot exam on next PCP visit.  Community Resource Referral / Chronic Care Management: CRR required this visit?  No   CCM required this visit?  No     Plan:     I have personally reviewed and noted the following in the patient's chart:   Medical and social history Use of alcohol, tobacco or illicit drugs  Current medications and supplements including opioid prescriptions. Patient is not currently taking opioid prescriptions. Functional ability and status Nutritional status Physical activity Advanced directives List of other physicians Hospitalizations, surgeries, and ER visits in previous 12 months Vitals Screenings to include cognitive, depression, and falls Referrals and appointments  In addition, I have reviewed and discussed with patient certain preventive protocols, quality metrics, and best practice recommendations. A written personalized care plan for preventive services as well as general preventive health recommendations were provided to patient.     Sue Lush, LPN   01/26/1477   After Visit Summary: (MyChart) Due to this being a telephonic visit, the after visit summary with patients personalized plan was offered to patient via MyChart   Nurse Notes: The patient states she is doing well and has no concerns or questions at this time.

## 2023-02-23 ENCOUNTER — Ambulatory Visit
Admission: RE | Admit: 2023-02-23 | Discharge: 2023-02-23 | Discharge status: Against Medical Advice | Payer: Medicare HMO | Source: Ambulatory Visit | Attending: Family | Admitting: Family

## 2023-03-01 ENCOUNTER — Ambulatory Visit: Payer: Medicare HMO | Admitting: Anesthesiology

## 2023-03-01 ENCOUNTER — Encounter: Payer: Self-pay | Admitting: Gastroenterology

## 2023-03-01 ENCOUNTER — Other Ambulatory Visit: Payer: Self-pay

## 2023-03-01 ENCOUNTER — Encounter
Admission: RE | Disposition: A | Discharge status: Home / Self Care | Payer: Self-pay | Source: Home / Self Care | Attending: Gastroenterology

## 2023-03-01 ENCOUNTER — Ambulatory Visit
Admission: RE | Admit: 2023-03-01 | Discharge: 2023-03-01 | Disposition: A | Discharge status: Home / Self Care | Payer: Medicare HMO | Attending: Gastroenterology | Admitting: Gastroenterology

## 2023-03-01 DIAGNOSIS — D175 Benign lipomatous neoplasm of intra-abdominal organs: Secondary | ICD-10-CM

## 2023-03-01 DIAGNOSIS — E119 Type 2 diabetes mellitus without complications: Secondary | ICD-10-CM | POA: Insufficient documentation

## 2023-03-01 DIAGNOSIS — I10 Essential (primary) hypertension: Secondary | ICD-10-CM | POA: Insufficient documentation

## 2023-03-01 DIAGNOSIS — Z79899 Other long term (current) drug therapy: Secondary | ICD-10-CM | POA: Insufficient documentation

## 2023-03-01 DIAGNOSIS — Z7984 Long term (current) use of oral hypoglycemic drugs: Secondary | ICD-10-CM | POA: Insufficient documentation

## 2023-03-01 DIAGNOSIS — K219 Gastro-esophageal reflux disease without esophagitis: Secondary | ICD-10-CM | POA: Diagnosis not present

## 2023-03-01 DIAGNOSIS — Z1211 Encounter for screening for malignant neoplasm of colon: Secondary | ICD-10-CM | POA: Diagnosis not present

## 2023-03-01 DIAGNOSIS — Z7985 Long-term (current) use of injectable non-insulin antidiabetic drugs: Secondary | ICD-10-CM | POA: Insufficient documentation

## 2023-03-01 DIAGNOSIS — K635 Polyp of colon: Secondary | ICD-10-CM | POA: Diagnosis not present

## 2023-03-01 DIAGNOSIS — Z6833 Body mass index (BMI) 33.0-33.9, adult: Secondary | ICD-10-CM | POA: Diagnosis not present

## 2023-03-01 DIAGNOSIS — D126 Benign neoplasm of colon, unspecified: Secondary | ICD-10-CM

## 2023-03-01 DIAGNOSIS — Z87891 Personal history of nicotine dependence: Secondary | ICD-10-CM | POA: Insufficient documentation

## 2023-03-01 HISTORY — PX: POLYPECTOMY: SHX5525

## 2023-03-01 HISTORY — PX: COLONOSCOPY WITH PROPOFOL: SHX5780

## 2023-03-01 SURGERY — COLONOSCOPY WITH PROPOFOL
Anesthesia: General

## 2023-03-01 MED ORDER — PROPOFOL 500 MG/50ML IV EMUL
INTRAVENOUS | Status: DC | PRN
  Administered 2023-03-01: 150 ug/kg/min via INTRAVENOUS
  Administered 2023-03-01: 70 mg via INTRAVENOUS

## 2023-03-01 MED ORDER — SODIUM CHLORIDE 0.9 % IV SOLN: INTRAVENOUS | Status: DC

## 2023-03-01 NOTE — Op Note (Signed)
[facility] Gastroenterology Patient Name: Olivia Kirk [place] Procedure Date: 03/01/2023 10:53 AM MRN: [id number] Account #: [id number] Date of Birth: 13-Nov-1955 Admit Type: Outpatient Age: 67 Room: [facility] ENDO ROOM 3 Gender: Female Note Status: Finalized Instrument Name: Nelda Marseille [id number] Procedure:             Colonoscopy Indications:           Screening for colorectal malignant neoplasm Providers:             Wyline Mood MD, MD Referring MD:          Mort Sawyers (Referring MD) Medicines:             Monitored Anesthesia Care Complications:         No immediate complications. Procedure:             Pre-Anesthesia Assessment:                        - Prior to the procedure, a History and Physical was                         performed, and patient medications, allergies and                         sensitivities were reviewed. The patient's tolerance                         of previous anesthesia was reviewed.                        - The risks and benefits of the procedure and the                         sedation options and risks were discussed with the                         patient. All questions were answered and informed                         consent was obtained.                        - ASA Grade Assessment: II - A patient with mild                         systemic disease.                        - Prior to the procedure, a History and Physical was                         performed, and patient medications, allergies and                         sensitivities were reviewed. The patient's tolerance                         of previous anesthesia was reviewed.                        - The risks and  benefits of the procedure and the                         sedation options and risks were discussed with the                         patient. All questions were answered and informed                         consent was obtained.                        - ASA Grade  Assessment: II - A patient with mild                         systemic disease.                        After obtaining informed consent, the colonoscope was                         passed under direct vision. Throughout the procedure,                         the patient's blood pressure, pulse, and oxygen                         saturations were monitored continuously. The                         Colonoscope was introduced through the anus and                         advanced to the the cecum, identified by the                         appendiceal orifice. The colonoscopy was performed                         with ease. The patient tolerated the procedure well.                         The quality of the bowel preparation was excellent.                         The ileocecal valve, appendiceal orifice, and rectum                         were photographed. Findings:      The perianal and digital rectal examinations were normal.      A 3 mm polyp was found in the transverse colon. The polyp was sessile.       The polyp was removed with a jumbo cold forceps. Resection and retrieval       were complete.      There was a medium-sized lipoma, in the proximal ascending colon.      The exam was otherwise without abnormality on direct and retroflexion       views. Impression:            -  One 3 mm polyp in the transverse colon, removed with                         a jumbo cold forceps. Resected and retrieved.                        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                        - The examination was otherwise normal on direct and                         retroflexion views. Recommendation:        - Discharge patient to home (with escort).                        - Resume previous diet.                        - Continue present medications.                        - Await pathology results.                        - Repeat colonoscopy date to be determined after                          pending pathology results are reviewed for                         surveillance based on pathology results. Procedure Code(s):     --- Professional ---                        [phone], Colonoscopy, flexible; with biopsy, single or                         multiple Diagnosis Code(s):     --- Professional ---                        Z12.11, Encounter for screening for malignant neoplasm                         of colon                        D12.3, Benign neoplasm of transverse colon (hepatic                         flexure or splenic flexure)                        D17.5, Benign lipomatous neoplasm of intra-abdominal                         organs CPT copyright 2022 American Medical Association. All rights reserved. The codes documented in this report are preliminary and upon coder review may  be revised to meet current compliance requirements. Wyline Mood, MD Wyline Mood MD, MD 03/01/2023 11:26:37 AM This report  has been signed electronically. Number of Addenda: 0 Note Initiated On: 03/01/2023 10:53 AM Scope Withdrawal Time: 0 hours 12 minutes 57 seconds  Total Procedure Duration: 0 hours 15 minutes 36 seconds  Estimated Blood Loss:  Estimated blood loss: none.      [facility]

## 2023-03-01 NOTE — Transfer of Care (Signed)
Immediate Anesthesia Transfer of Care Note  Patient: [place] A [place]  Procedure(s) Performed: COLONOSCOPY WITH PROPOFOL POLYPECTOMY  Patient Location: PACU  Anesthesia Type:General  Level of Consciousness: awake  Airway & Oxygen Therapy: Patient Spontanous Breathing  Post-op Assessment: Report given to RN and Post -op Vital signs reviewed and stable  Post vital signs: Reviewed and stable  Last Vitals:  Vitals Value Taken Time  BP 104/62 03/01/23 1127  Temp    Pulse 82 03/01/23 1127  Resp    SpO2 97 % 03/01/23 1127  Vitals shown include unfiled device data.  Last Pain:  Vitals:   03/01/23 1024  TempSrc: Temporal  PainSc: 0-No pain         Complications: There were no known notable events for this encounter.

## 2023-03-01 NOTE — H&P (Signed)
Wyline Mood, MD [address], [address], [place], [place], [address] [address], [address], [place], [place], [address] Phone: [phone]  Fax: [phone]  Primary Care Physician:  Mort Sawyers, FNP   Pre-Procedure History & Physical: HPI:  Olivia Kirk is a 67 y.o. female is here for an colonoscopy.   Past Medical History:  Diagnosis Date   Anemia    H/O   Arthritis    FINGER MIDDLE FINGER RIGHT HAND, LEFT KNEE   Breast cancer (HCC) 06/06/2016   LEFT: T1b (6 mm), N0, 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   GERD (gastroesophageal reflux disease)    RARE   Hyperlipidemia    Hypertension    Personal history of radiation therapy 2018   left breast cancer    Past Surgical History:  Procedure Laterality Date   BREAST BIOPSY Left 06/06/2016   left stereo. invasive mammary carcinoma   BREAST LUMPECTOMY Left 07/03/2016   invasive mammary carcinoma   BREAST LUMPECTOMY WITH NEEDLE LOCALIZATION Left 07/03/2016   Procedure: BREAST LUMPECTOMY WITH NEEDLE LOCALIZATION;  Surgeon: Earline Mayotte, MD;  Location: [facility] OR[facility];  Service: General;  Laterality: Left;   BREAST LUMPECTOMY WITH SENTINEL LYMPH NODE BIOPSY Left 07/03/2016   Procedure: BREAST LUMPECTOMY WITH SENTINEL LYMPH NODE BX;  Surgeon: Earline Mayotte, MD;  Location: [facility];  Service: General;  Laterality: Left;   hemrrhoid     TUBAL LIGATION      Prior to Admission medications   Medication Sig Start Date End Date Taking? Authorizing Provider  amLODipine (NORVASC) 5 MG tablet Take 1 tablet (5 mg total) by mouth daily. 07/12/22   Mort Sawyers, FNP  atorvastatin (LIPITOR) 80 MG tablet Take 1 tablet by mouth once daily 01/19/23   Mort Sawyers, FNP  Bioflavonoid Products (BIOFLEX PO) Take by mouth daily.    [provider]  Biotin 1000 MCG tablet Take by mouth daily.    [provider]  Calcium Carbonate (CALCIUM 600 PO) Take by mouth daily.    [provider]  Cholecalciferol (VITAMIN D3) 1000 units CAPS Take by mouth.    [provider]  hydrochlorothiazide (HYDRODIURIL) 25 MG tablet Take 1 tablet by mouth once daily 01/19/23   Mort Sawyers, FNP  losartan (COZAAR) 100 MG tablet Take 1 tablet (100 mg total) by mouth daily. 04/25/22   Mort Sawyers, FNP  metFORMIN (GLUCOPHAGE-XR) 500 MG 24 hr tablet Take 1 tablet by mouth once daily with breakfast 01/19/23   Mort Sawyers, FNP  metoprolol succinate (TOPROL-XL) 100 MG 24 hr tablet TAKE WITH OR IMMEDIATELY FOLLOWING A MEAL. 01/19/23   Mort Sawyers, FNP  Multiple Vitamins-Minerals (ALIVE WOMENS 50+ PO) Take by mouth daily.    [provider]  Omega-3 Fatty Acids (FISH OIL) 1000 MG CAPS Take by mouth daily.    [provider]  Semaglutide, 1 MG/DOSE, 4 MG/3ML SOPN Inject 1 mg Qweek [place] 02/06/23   Mort Sawyers, FNP  Semaglutide,0.25 or 0.5MG /DOS, (OZEMPIC, 0.25 OR 0.5 MG/DOSE,) 2 MG/3ML SOPN Start 0.25 mg weekly [place] for four weeks then increase to 0.5 mg [place] 12/19/22   Mort Sawyers, FNP  sertraline (ZOLOFT) 100 MG tablet Take 1 tablet by mouth once daily 01/19/23   Mort Sawyers, FNP  vitamin B-12 (CYANOCOBALAMIN) 1000 MCG tablet Take 1,000 mcg by mouth daily.    [provider]    Allergies as of 02/05/2023 - Review Complete 12/19/2022  Allergen Reaction Noted  Guaifenesin & derivatives Itching, Dermatitis, and Rash 05/13/2012   Blueberry flavoring agent (non-screening) Itching and Rash 06/09/2019    Family History  Problem Relation Age of Onset   Hypertension Mother    Cancer Father        unknown type   Heart disease Father    Breast cancer Paternal Aunt 74   Hypertension Maternal Grandmother    Stroke Maternal Grandmother     Social History   Socioeconomic History   Marital status: Married    Spouse name: Donnie [place]   Number of children: 4   Years of education: 12   Highest education level: Not on file  Occupational History     Employer: [employer]    Comment: caregiver  Tobacco Use   Smoking status: Former    Current packs/day: 0.00    Average packs/day: 0.3 packs/day for 6.0 years (1.5 ttl pk-yrs)    Types: Cigarettes    Start date: 05/06/1981    Quit date: 05/07/1987    Years since quitting: 35.8   Smokeless tobacco: Never  Vaping Use   Vaping status: Never Used  Substance and Sexual Activity   Alcohol use: Not Currently   Drug use: No   Sexual activity: Yes    Partners: Male    Birth control/protection: Post-menopausal  Other Topics Concern   Not on file  Social History Narrative   09/02/20   From: [place] originally   Living: with Donnie, husband (1982) and Daughter and 2 grandchildren and son on the property   Work: retired, working as Engineer, structural      Family: 4 children - Dispensing optician, twins - [place] and Jeralyn [place], [place] - 4 grandchildren       Enjoys: watch movies, go to church      Exercise: not currently   Diet: diabetic diet, healthy      Safety   Seat belts: Yes    Guns: no    Safe in relationships: Yes    '   Social Drivers of Corporate investment banker Strain: Low Risk  (02/20/2023)   Overall Financial Resource Strain (CARDIA)    Difficulty of Paying Living Expenses: Not hard at all  Food Insecurity: No Food Insecurity (02/20/2023)   Hunger Vital Sign    Worried About Running Out of Food in the Last Year: Never true    Ran Out of Food in the Last Year: Never true  Transportation Needs: No Transportation Needs (02/20/2023)   PRAPARE - Administrator, Civil Service (Medical): No    Lack of Transportation (Non-Medical): No  Physical Activity: Sufficiently Active (02/20/2023)   Exercise Vital Sign    Days of Exercise per Week: 5 days    Minutes of Exercise per Session: 60 min  Stress: No Stress Concern Present (02/20/2023)   [employer] of Occupational Health - Occupational Stress Questionnaire    Feeling of Stress : Only a little  Social Connections: Socially  Integrated (02/20/2023)   Social Connection and Isolation Panel [NHANES]    Frequency of Communication with Friends and Family: More than three times a week    Frequency of Social Gatherings with Friends and Family: Three times a week    Attends Religious Services: More than 4 times per year    Active Member of Clubs or Organizations: Yes    Attends Banker Meetings: More than 4 times per year    Marital Status: Married  Intimate Partner Violence: Not At Risk (02/20/2023)  Humiliation, Afraid, Rape, and Kick questionnaire    Fear of Current or Ex-Partner: No    Emotionally Abused: No    Physically Abused: No    Sexually Abused: No    Review of Systems: See HPI, otherwise negative ROS  Physical Exam: There were no vitals taken for this visit. General:   Alert,  pleasant and cooperative in NAD Head:  Normocephalic and atraumatic. Neck:  Supple; no masses or thyromegaly. Lungs:  Clear throughout to auscultation, normal respiratory effort.    Heart:  +S1, +S2, Regular rate and rhythm, No edema. Abdomen:  Soft, nontender and nondistended. Normal bowel sounds, without guarding, and without rebound.   Neurologic:  Alert and  oriented x4;  grossly normal neurologically.  Impression/Plan: Olivia Kirk is here for an colonoscopy to be performed for Screening colonoscopy average risk   Risks, benefits, limitations, and alternatives regarding  colonoscopy have been reviewed with the patient.  Questions have been answered.  All parties agreeable.   Wyline Mood, MD  03/01/2023, 10:12 AM

## 2023-03-01 NOTE — Anesthesia Preprocedure Evaluation (Signed)
Anesthesia Evaluation  Patient identified by MRN, date of birth, ID band Patient awake    Reviewed: Allergy & Precautions, H&P , NPO status , Patient's Chart, lab work & pertinent test results, reviewed documented beta blocker date and time   History of Anesthesia Complications Negative for: history of anesthetic complications  Airway Mallampati: I  TM Distance: >3 FB Neck ROM: full    Dental  (+) Dental Advidsory Given, Upper Dentures, Lower Dentures   Pulmonary neg pulmonary ROS, former smoker   Pulmonary exam normal        Cardiovascular Exercise Tolerance: Good hypertension, (-) angina (-) Past MI and (-) Cardiac Stents Normal cardiovascular exam(-) dysrhythmias (-) Valvular Problems/Murmurs Rate:Normal     Neuro/Psych negative neurological ROS  negative psych ROS   GI/Hepatic negative GI ROS, Neg liver ROS,GERD  Medicated,,  Endo/Other  diabetes  Class 3 obesity  Renal/GU negative Renal ROS  negative genitourinary   Musculoskeletal   Abdominal   Peds  Hematology negative hematology ROS (+) Blood dyscrasia, anemia   Anesthesia Other Findings   Reproductive/Obstetrics negative OB ROS                             Anesthesia Physical Anesthesia Plan  ASA: 2  Anesthesia Plan: General   Post-op Pain Management:    Induction: Intravenous  PONV Risk Score and Plan: 2 and Propofol infusion and TIVA  Airway Management Planned: Natural Airway and Nasal Cannula  Additional Equipment:   Intra-op Plan:   Post-operative Plan:   Informed Consent: I have reviewed the patients History and Physical, chart, labs and discussed the procedure including the risks, benefits and alternatives for the proposed anesthesia with the patient or authorized representative who has indicated his/her understanding and acceptance.       Plan Discussed with: CRNA  Anesthesia Plan Comments:          Anesthesia Quick Evaluation

## 2023-03-02 ENCOUNTER — Encounter: Payer: Self-pay | Admitting: Gastroenterology

## 2023-03-02 LAB — SURGICAL PATHOLOGY

## 2023-03-02 NOTE — Progress Notes (Signed)
noted 

## 2023-03-05 DIAGNOSIS — H5203 Hypermetropia, bilateral: Secondary | ICD-10-CM | POA: Diagnosis not present

## 2023-03-05 DIAGNOSIS — H52209 Unspecified astigmatism, unspecified eye: Secondary | ICD-10-CM | POA: Diagnosis not present

## 2023-03-05 DIAGNOSIS — H524 Presbyopia: Secondary | ICD-10-CM | POA: Diagnosis not present

## 2023-03-06 ENCOUNTER — Encounter: Payer: Self-pay | Admitting: Gastroenterology

## 2023-03-08 NOTE — Anesthesia Postprocedure Evaluation (Signed)
Anesthesia Post Note  Patient: Olivia Kirk [place]  Procedure(s) Performed: COLONOSCOPY WITH PROPOFOL POLYPECTOMY  Patient location during evaluation: Endoscopy Anesthesia Type: General Level of consciousness: awake and alert Pain management: pain level controlled Vital Signs Assessment: post-procedure vital signs reviewed and stable Respiratory status: spontaneous breathing, nonlabored ventilation, respiratory function stable and patient connected to nasal cannula oxygen Cardiovascular status: blood pressure returned to baseline and stable Postop Assessment: no apparent nausea or vomiting Anesthetic complications: no   There were no known notable events for this encounter.   Last Vitals:  Vitals:   03/01/23 1146 03/01/23 1147  BP: 128/72 129/74  Pulse: 63 64  Resp:    Temp:    SpO2: 100% 100%    Last Pain:  Vitals:   03/01/23 1024  TempSrc: Temporal  PainSc: 0-No pain                 Lenard Simmer

## 2023-04-03 ENCOUNTER — Other Ambulatory Visit: Payer: Self-pay | Admitting: Family

## 2023-04-03 ENCOUNTER — Ambulatory Visit: Payer: Medicare Other

## 2023-04-03 DIAGNOSIS — E1140 Type 2 diabetes mellitus with diabetic neuropathy, unspecified: Secondary | ICD-10-CM

## 2023-04-03 DIAGNOSIS — F419 Anxiety disorder, unspecified: Secondary | ICD-10-CM

## 2023-04-03 DIAGNOSIS — I10 Essential (primary) hypertension: Secondary | ICD-10-CM

## 2023-04-03 DIAGNOSIS — E119 Type 2 diabetes mellitus without complications: Secondary | ICD-10-CM

## 2023-04-03 DIAGNOSIS — E785 Hyperlipidemia, unspecified: Secondary | ICD-10-CM

## 2023-04-03 NOTE — Telephone Encounter (Signed)
 Copied from CRM [id number]. Topic: Clinical - Medication Refill >> Apr 03, 2023 10:55 AM Leotis ORN wrote: Most Recent Primary Care Visit:  Provider: ESTELLA ERMINIO CROME  Department: [facility]  Visit Type: MEDICARE AWV, SEQUENTIAL  Date: 02/20/2023  Medication:   amLODipine  (NORVASC ) 5 MG tablet [[id number]]   sertraline  (ZOLOFT ) 100 MG tablet   metFORMIN  (GLUCOPHAGE -XR) 500 MG 24 hr tablet  metoprolol  succinate (TOPROL -XL) 100 MG 24 hr tablet   atorvastatin  (LIPITOR) 80 MG tablet  hydrochlorothiazide  (HYDRODIURIL ) 25 MG tablet   Semaglutide ,0.25 or 0.5MG /DOS, (OZEMPIC , 0.25 OR 0.5 MG/DOSE,) 2 MG/3ML SOPN    Has the patient contacted their pharmacy? Yes (Agent: If no, request that the patient contact the pharmacy for the refill. If patient does not wish to contact the pharmacy document the reason why and proceed with request.) (Agent: If yes, when and what did the pharmacy advise?)  Is this the correct pharmacy for this prescription? No If no, delete pharmacy and type the correct one.  This is the patient's preferred pharmacy:  [facility]    Has the prescription been filled recently? Yes  Is the patient out of the medication? Yes  Has the patient been seen for an appointment in the last year OR does the patient have an upcoming appointment? Yes  Can we respond through MyChart? Yes  Agent: Please be advised that Rx refills may take up to 3 business days. We ask that you follow-up with your pharmacy.

## 2023-04-04 MED ORDER — HYDROCHLOROTHIAZIDE 25 MG PO TABS: 25.0000 mg | ORAL_TABLET | Freq: Every day | ORAL | 0 refills | Status: DC

## 2023-04-04 MED ORDER — METOPROLOL SUCCINATE ER 100 MG PO TB24: ORAL_TABLET | ORAL | 0 refills | Status: DC

## 2023-04-04 MED ORDER — SERTRALINE HCL 100 MG PO TABS: 100.0000 mg | ORAL_TABLET | Freq: Every day | ORAL | 0 refills | Status: DC

## 2023-04-04 MED ORDER — ATORVASTATIN CALCIUM 80 MG PO TABS: 80.0000 mg | ORAL_TABLET | Freq: Every day | ORAL | 0 refills | Status: DC

## 2023-04-04 MED ORDER — METFORMIN HCL ER 500 MG PO TB24: 500.0000 mg | ORAL_TABLET | Freq: Every day | ORAL | 0 refills | Status: DC

## 2023-04-04 MED ORDER — AMLODIPINE BESYLATE 5 MG PO TABS: 5.0000 mg | ORAL_TABLET | Freq: Every day | ORAL | 0 refills | Status: DC

## 2023-04-04 NOTE — Telephone Encounter (Signed)
 What is current dose of ozempic  pt is currently on?   Also pt overdue for f/u sending refill however pt needs to make f/u appt within the next one month.

## 2023-04-09 ENCOUNTER — Other Ambulatory Visit: Payer: Self-pay | Admitting: *Deleted

## 2023-04-09 DIAGNOSIS — I10 Essential (primary) hypertension: Secondary | ICD-10-CM

## 2023-04-09 MED ORDER — METOPROLOL SUCCINATE ER 100 MG PO TB24: 100.0000 mg | ORAL_TABLET | Freq: Every day | ORAL | 0 refills | Status: DC

## 2023-04-11 ENCOUNTER — Telehealth: Payer: Self-pay

## 2023-04-11 DIAGNOSIS — I10 Essential (primary) hypertension: Secondary | ICD-10-CM

## 2023-04-11 MED ORDER — METOPROLOL SUCCINATE ER 100 MG PO TB24: 100.0000 mg | ORAL_TABLET | Freq: Every day | ORAL | 0 refills | Status: DC

## 2023-04-11 NOTE — Telephone Encounter (Signed)
Per Tabitha's OV note, pt is to take this once daily. Rx has been resent to reflect this.

## 2023-04-11 NOTE — Addendum Note (Signed)
Addended by: Jaynee Eagles C on: 04/11/2023 02:56 PM   Modules accepted: Orders

## 2023-04-11 NOTE — Telephone Encounter (Signed)
Copied from CRM [id number]. Topic: Clinical - Medication Question >> Apr 11, 2023  2:25 PM Almira Coaster wrote: Reason for CRM: Jonny Ruiz from [facility] is calling for clarification on metoprolol succinate (TOPROL-XL) 100 MG 24 hr tablet. Per Jonny Ruiz it should state take once a day or twice day. Their call back number is  [phone].

## 2023-04-18 ENCOUNTER — Ambulatory Visit: Payer: Medicare Other

## 2023-04-19 ENCOUNTER — Telehealth: Payer: Self-pay | Admitting: Family

## 2023-04-19 NOTE — Telephone Encounter (Signed)
Copied from CRM [id number]. Topic: Clinical - Prescription Issue >> Apr 19, 2023  4:08 PM Irine Seal wrote: Reason for CRM: Patient swapped to mail order [facility] pharmacy, however her ozempic did not arrive, because she needs the doctor to provide proof that this is for diabetes and not for any other reason. she is needing a curtesy refill sent into the [facility] pharmacy to hold her over until her mail order can arrive. She is wanting to know her next steps and if there is any out of pocket costs Patient is requesting a call back to further discuss [phone]

## 2023-04-20 NOTE — Telephone Encounter (Signed)
LM for pt to return call.  Sounds like she might need a PA for Ozempic.

## 2023-04-23 ENCOUNTER — Ambulatory Visit
Admission: RE | Admit: 2023-04-23 | Discharge: 2023-04-23 | Disposition: A | Discharge status: Home / Self Care | Payer: Medicare Other | Source: Ambulatory Visit | Attending: Family | Admitting: Family

## 2023-04-23 DIAGNOSIS — Z1231 Encounter for screening mammogram for malignant neoplasm of breast: Secondary | ICD-10-CM

## 2023-04-23 NOTE — Telephone Encounter (Signed)
Spoke with pt and she states that she does in fact need a prior authorization. Advised her that I would send this over to our prior auth team.

## 2023-04-24 ENCOUNTER — Other Ambulatory Visit (HOSPITAL_COMMUNITY): Payer: Self-pay

## 2023-04-24 ENCOUNTER — Telehealth: Payer: Self-pay

## 2023-04-24 NOTE — Telephone Encounter (Signed)
PA request has been Submitted. New Encounter created for follow up. For additional info see Pharmacy Prior Auth telephone encounter from 04/24/23.

## 2023-04-24 NOTE — Telephone Encounter (Signed)
Pharmacy Patient Advocate Encounter   Received notification from Pt Calls Messages that prior authorization for Ozempic (1 MG/DOSE) 4MG /3ML pen-injectors is required/requested.   Insurance verification completed.   The patient is insured through [facility] .   Per test claim: PA required; PA submitted to above mentioned insurance via CoverMyMeds Key/confirmation #/EOC [facility] Status is pending

## 2023-04-25 ENCOUNTER — Other Ambulatory Visit (HOSPITAL_COMMUNITY): Payer: Self-pay

## 2023-04-25 NOTE — Telephone Encounter (Signed)
 Pharmacy Patient Advocate Encounter  Received notification from [facility] that Prior Authorization for Ozempic  (1 MG/DOSE) 4MG /3ML pen-injectors  has been CANCELLED due to: This medication or product is on your plan's list of covered drugs. Prior authorization is not required at this time.   PA #/Case ID/Reference #: [id number]        Per test claim: The current 28 day co-pay is, $516.36.  No PA needed at this time. This test claim was processed through [facility]- copay amounts may vary at other pharmacies due to pharmacy/plan contracts, or as the patient moves through the different stages of their insurance plan.

## 2023-04-26 ENCOUNTER — Encounter: Payer: Self-pay | Admitting: Family

## 2023-05-28 ENCOUNTER — Encounter: Payer: Self-pay | Admitting: Family

## 2023-05-28 ENCOUNTER — Ambulatory Visit (INDEPENDENT_AMBULATORY_CARE_PROVIDER_SITE_OTHER)
Admission: RE | Admit: 2023-05-28 | Discharge: 2023-05-28 | Disposition: A | Discharge status: Home / Self Care | Source: Ambulatory Visit | Attending: Family

## 2023-05-28 ENCOUNTER — Ambulatory Visit (INDEPENDENT_AMBULATORY_CARE_PROVIDER_SITE_OTHER): Admitting: Family

## 2023-05-28 VITALS — BP 142/90 | HR 55 | Temp 98.2°F | Ht 72.0 in | Wt 243.0 lb

## 2023-05-28 DIAGNOSIS — S4992XA Unspecified injury of left shoulder and upper arm, initial encounter: Secondary | ICD-10-CM

## 2023-05-28 DIAGNOSIS — S299XXA Unspecified injury of thorax, initial encounter: Secondary | ICD-10-CM | POA: Insufficient documentation

## 2023-05-28 DIAGNOSIS — R2232 Localized swelling, mass and lump, left upper limb: Secondary | ICD-10-CM | POA: Diagnosis not present

## 2023-05-28 DIAGNOSIS — M791 Myalgia, unspecified site: Secondary | ICD-10-CM

## 2023-05-28 DIAGNOSIS — E1140 Type 2 diabetes mellitus with diabetic neuropathy, unspecified: Secondary | ICD-10-CM

## 2023-05-28 DIAGNOSIS — N6325 Unspecified lump in the left breast, overlapping quadrants: Secondary | ICD-10-CM | POA: Diagnosis not present

## 2023-05-28 DIAGNOSIS — M25512 Pain in left shoulder: Secondary | ICD-10-CM | POA: Diagnosis not present

## 2023-05-28 DIAGNOSIS — Z853 Personal history of malignant neoplasm of breast: Secondary | ICD-10-CM

## 2023-05-28 DIAGNOSIS — M19012 Primary osteoarthritis, left shoulder: Secondary | ICD-10-CM | POA: Diagnosis not present

## 2023-05-28 MED ORDER — TIZANIDINE HCL 4 MG PO TABS
ORAL_TABLET | ORAL | 0 refills | Status: DC
Start: 2023-05-28 — End: 2023-06-13

## 2023-05-28 MED ORDER — OZEMPIC (0.25 OR 0.5 MG/DOSE) 2 MG/3ML ~~LOC~~ SOPN: PEN_INJECTOR | SUBCUTANEOUS | 0 refills | Status: DC

## 2023-05-28 MED ORDER — SEMAGLUTIDE (1 MG/DOSE) 4 MG/3ML ~~LOC~~ SOPN: 1.0000 mg | PEN_INJECTOR | SUBCUTANEOUS | 0 refills | Status: DC

## 2023-05-28 MED ORDER — OZEMPIC (0.25 OR 0.5 MG/DOSE) 2 MG/3ML ~~LOC~~ SOPN
0.5000 mg | PEN_INJECTOR | SUBCUTANEOUS | 0 refills | Status: DC
Start: 2023-06-18 — End: 2023-07-30

## 2023-05-28 NOTE — Assessment & Plan Note (Signed)
 Xray to r/o acute injury  Heat, ice, range of motion exercises, hand out given Rx tizanidine sedative precautions given to pt Switch ibuprofen/tylenol

## 2023-05-28 NOTE — Assessment & Plan Note (Signed)
 Diag mammo and u/s left breast Pending results

## 2023-05-28 NOTE — Patient Instructions (Addendum)
 I have sent an electronic order over to your preferred location for the following:   [x]   Diagnostic left mammogram and left breast u/s  Please give this center a call to get scheduled at your convenience.  [x]   [facility]  [address] [place] [address]  [phone]  Make sure to wear two piece  clothing  No lotions powders or deodorants the day of the appointment Make sure to bring picture ID and insurance card.  Bring list of medications you are currently taking including any supplements.   ------------------------------------  Recommend tylenol 500 mg and ibuprofen 600 mg together for pain every 6-8 hours.  Heat to site, lidocaine patches as needed.  Exercises to area as tolerated.

## 2023-05-28 NOTE — Progress Notes (Signed)
 Established Patient Office Visit  Subjective:   Patient ID: Olivia Kirk, female    DOB: [birth year]  Age: 68 y.o. MRN: [id number]  CC:  Chief Complaint  Patient presents with   Chest Pain    States that she is having "chest pain" for over 2 months.    HPI: Olivia Kirk is a 68 y.o. female presenting on 05/28/2023 for Chest Pain (States that she is having "chest pain" for over 2 months.)  She notes some 'chest pains' in her mid to left upper chest and into her left arm. She states it feels 'weird' if she breaths hard she gets this sensation, feels 'it feels heavy on her chest'. She will massage it and at one time it will go away. She thought maybe it was indigestion but not 100% sure this is it anymore because it comes and goes. It is not everyday but typically comes at night time. She would note when it does occur it is up to three times in a day. This has been ongoing for the last two months.   She did fall down the stairs last month and had initially thought that was the reason her arm was hurting, she had hurt her left shoulder and her left ankle. She also notes one month earlier she had fallen and hit her head while outside but did not hurt her left side then. Her husband had put up a fence and she stumbled over it because she wasn't expecting it.  The pain is not worse with movement, she does have the left arm pain right now thinks maybe slept on it wrong.        ROS: Negative unless specifically indicated above in HPI.   Relevant past medical history reviewed and updated as indicated.   Allergies and medications reviewed and updated.   Current Outpatient Medications:    amLODipine (NORVASC) 5 MG tablet, Take 1 tablet (5 mg total) by mouth daily., Disp: 90 tablet, Rfl: 0   atorvastatin (LIPITOR) 80 MG tablet, Take 1 tablet (80 mg total) by mouth daily., Disp: 90 tablet, Rfl: 0   Bioflavonoid Products (BIOFLEX PO), Take by mouth daily., Disp: , Rfl:    Biotin 1000  MCG tablet, Take by mouth daily., Disp: , Rfl:    Calcium Carbonate (CALCIUM 600 PO), Take by mouth daily., Disp: , Rfl:    Cholecalciferol (VITAMIN D3) 1000 units CAPS, Take by mouth., Disp: , Rfl:    hydrochlorothiazide (HYDRODIURIL) 25 MG tablet, Take 1 tablet (25 mg total) by mouth daily., Disp: 90 tablet, Rfl: 0   losartan (COZAAR) 100 MG tablet, Take 1 tablet (100 mg total) by mouth daily., Disp: 90 tablet, Rfl: 1   metFORMIN (GLUCOPHAGE-XR) 500 MG 24 hr tablet, Take 1 tablet (500 mg total) by mouth daily with breakfast., Disp: 90 tablet, Rfl: 0   metoprolol succinate (TOPROL-XL) 100 MG 24 hr tablet, Take 1 tablet (100 mg total) by mouth daily., Disp: 90 tablet, Rfl: 0   Multiple Vitamins-Minerals (ALIVE WOMENS 50+ PO), Take by mouth daily., Disp: , Rfl:    Omega-3 Fatty Acids (FISH OIL) 1000 MG CAPS, Take by mouth daily., Disp: , Rfl:    [START ON 07/09/2023] Semaglutide, 1 MG/DOSE, 4 MG/3ML SOPN, Inject 1 mg as directed once a week., Disp: 3 mL, Rfl: 0   [START ON 06/18/2023] Semaglutide,0.25 or 0.5MG /DOS, (OZEMPIC, 0.25 OR 0.5 MG/DOSE,) 2 MG/3ML SOPN, Inject 0.5 mg into the skin once a week., Disp: 3 mL, Rfl:  0   sertraline (ZOLOFT) 100 MG tablet, Take 1 tablet (100 mg total) by mouth daily., Disp: 90 tablet, Rfl: 0   tiZANidine (ZANAFLEX) 4 MG tablet, 1/2 to one tablet po at bedtime prn muscle spasm, Disp: 30 tablet, Rfl: 0   vitamin B-12 (CYANOCOBALAMIN) 1000 MCG tablet, Take 1,000 mcg by mouth daily., Disp: , Rfl:    Semaglutide,0.25 or 0.5MG /DOS, (OZEMPIC, 0.25 OR 0.5 MG/DOSE,) 2 MG/3ML SOPN, Start 0.25 mg weekly [place] for four weeks then increase to 0.5 mg Qweek, Disp: 3 mL, Rfl: 0  Allergies  Allergen Reactions   Guaifenesin & Derivatives Itching, Dermatitis and Rash   Blueberry Flavoring Agent (Non-Screening) Itching and Rash    Food allergy to blueberry    Objective:   BP (!) 142/90 (BP Location: Left Arm, Patient Position: Sitting, Cuff Size: Large)   Pulse (!) 55   Temp 98.2  F (36.8 C) (Temporal)   Ht 6' (1.829 m)   Wt 243 lb (110.2 kg)   SpO2 98%   BMI 32.96 kg/m    Physical Exam Constitutional:      General: She is not in acute distress.    Appearance: Normal appearance. She is normal weight. She is not ill-appearing, toxic-appearing or diaphoretic.  HENT:     Head: Normocephalic.  Cardiovascular:     Rate and Rhythm: Normal rate.  Pulmonary:     Effort: Pulmonary effort is normal.  Musculoskeletal:     Left shoulder: Bony tenderness present. Decreased range of motion.     Comments: Positive apley drop can and limited range of motion with extension. Only can extend 90 degrees left shoulder without pain  Neurological:     General: No focal deficit present.     Mental Status: She is alert and oriented to person, place, and time. Mental status is at baseline.  Psychiatric:        Mood and Affect: Mood normal.        Behavior: Behavior normal.        Thought Content: Thought content normal.        Judgment: Judgment normal.     Assessment & Plan:  Injury of left shoulder, initial encounter Assessment & Plan: Xray to r/o acute injury  Heat, ice, range of motion exercises, hand out given Rx tizanidine sedative precautions given to pt Switch ibuprofen/tylenol   Orders: -     DG Shoulder Left; Future  Chest injury, initial encounter -     DG Chest 2 View; Future  Mass overlapping multiple quadrants of left breast Assessment & Plan: Diag mammo and u/s left breast Pending results  Orders: -     MM 3D DIAGNOSTIC MAMMOGRAM UNILATERAL LEFT BREAST; Future -     [place] LIMITED ULTRASOUND INCLUDING AXILLA LEFT BREAST ; Future  Axillary mass, left -     MM 3D DIAGNOSTIC MAMMOGRAM UNILATERAL LEFT BREAST; Future -     [place] LIMITED ULTRASOUND INCLUDING AXILLA LEFT BREAST ; Future  History of breast cancer -     MM 3D DIAGNOSTIC MAMMOGRAM UNILATERAL LEFT BREAST; Future -     [place] LIMITED ULTRASOUND INCLUDING AXILLA LEFT BREAST ; Future  Type 2  diabetes mellitus with diabetic neuropathy, without long-term current use of insulin (HCC) -     Ozempic (0.25 or 0.5 MG/DOSE); Start 0.25 mg weekly [place] for four weeks then increase to 0.5 mg Qweek  Dispense: 3 mL; Refill: 0 -     Ozempic (0.25 or 0.5 MG/DOSE); Inject 0.5 mg  into the skin once a week.  Dispense: 3 mL; Refill: 0 -     Semaglutide (1 MG/DOSE); Inject 1 mg as directed once a week.  Dispense: 3 mL; Refill: 0  Muscle tenderness -     tiZANidine HCl; 1/2 to one tablet po at bedtime prn muscle spasm  Dispense: 30 tablet; Refill: 0     Follow up plan: Return in about 3 months (around 08/28/2023) for f/u diabetes.  Mort Sawyers, FNP

## 2023-06-01 ENCOUNTER — Encounter: Payer: Self-pay | Admitting: Family

## 2023-06-04 NOTE — Telephone Encounter (Signed)
 Can you ask her form question I sent in mychart?  I have for in inbox, just waiting for answer to proceed or not.

## 2023-06-05 NOTE — Telephone Encounter (Signed)
 LM for pt to returncall

## 2023-06-06 NOTE — Telephone Encounter (Signed)
 Spoke with pt and she states that she was aware that her insurance company was going to be sending this form to [place].

## 2023-06-08 ENCOUNTER — Other Ambulatory Visit: Payer: Self-pay | Admitting: Family

## 2023-06-08 DIAGNOSIS — E785 Hyperlipidemia, unspecified: Secondary | ICD-10-CM

## 2023-06-08 DIAGNOSIS — E119 Type 2 diabetes mellitus without complications: Secondary | ICD-10-CM

## 2023-06-08 DIAGNOSIS — F419 Anxiety disorder, unspecified: Secondary | ICD-10-CM

## 2023-06-08 DIAGNOSIS — I10 Essential (primary) hypertension: Secondary | ICD-10-CM

## 2023-06-13 ENCOUNTER — Other Ambulatory Visit: Payer: Self-pay | Admitting: Family

## 2023-06-13 DIAGNOSIS — I10 Essential (primary) hypertension: Secondary | ICD-10-CM

## 2023-06-13 DIAGNOSIS — M791 Myalgia, unspecified site: Secondary | ICD-10-CM

## 2023-06-13 DIAGNOSIS — E785 Hyperlipidemia, unspecified: Secondary | ICD-10-CM

## 2023-06-13 DIAGNOSIS — F419 Anxiety disorder, unspecified: Secondary | ICD-10-CM

## 2023-06-13 DIAGNOSIS — E119 Type 2 diabetes mellitus without complications: Secondary | ICD-10-CM

## 2023-06-13 MED ORDER — HYDROCHLOROTHIAZIDE 25 MG PO TABS
25.0000 mg | ORAL_TABLET | Freq: Every day | ORAL | 1 refills | Status: DC
Start: 2023-06-13 — End: 2023-12-07

## 2023-06-13 MED ORDER — TIZANIDINE HCL 4 MG PO TABS: ORAL_TABLET | ORAL | 0 refills | Status: DC

## 2023-06-13 MED ORDER — SERTRALINE HCL 100 MG PO TABS: 100.0000 mg | ORAL_TABLET | Freq: Every day | ORAL | 1 refills | Status: DC

## 2023-06-13 MED ORDER — ATORVASTATIN CALCIUM 80 MG PO TABS: 80.0000 mg | ORAL_TABLET | Freq: Every day | ORAL | 1 refills | Status: DC

## 2023-06-13 MED ORDER — METFORMIN HCL ER 500 MG PO TB24: 500.0000 mg | ORAL_TABLET | Freq: Every day | ORAL | 1 refills | Status: DC

## 2023-06-13 NOTE — Telephone Encounter (Signed)
 Has been signed and is in outbox

## 2023-06-13 NOTE — Telephone Encounter (Signed)
 Copied from CRM [id number]. Topic: Clinical - Medication Refill >> Jun 13, 2023 11:15 AM Saverio Danker wrote: Most Recent Primary Care Visit:  Provider: Mort Sawyers  Department: [facility]  Visit Type: OFFICE VISIT  Date: 05/28/2023  Medication:  ciciliahydrochlorothiazide (HYDRODIURIL) 25 MG tablet metFORMIN (GLUCOPHAGE-XR) 500 MG 24 hr tablet sertraline (ZOLOFT) 100 MG tablet atorvastatin (LIPITOR) 80 MG tablet tiZANidine (ZANAFLEX) 4 MG tablet amLODipine (NORVASC) 5 MG tablet   Has the patient contacted their pharmacy? Yes (Agent: If no, request that the patient contact the pharmacy for the refill. If patient does not wish to contact the pharmacy document the reason why and proceed with request.) (Agent: If yes, when and what did the pharmacy advise?)  Is this the correct pharmacy for this prescription? Yes If no, delete pharmacy and type the correct one.  This is the patient's preferred pharmacy:  [facility] [address]), [place] - [address]) [place] [address] Phone: [phone] Fax: [phone]  [facility] [place], [place] - [address] [place] [address] Phone: [phone] Fax: [phone]   Has the prescription been filled recently? yes  Is the patient out of the medication? No  Has the patient been seen for an appointment in the last year OR does the patient have an upcoming appointment? Yes  Can we respond through MyChart? Yes  Agent: Please be advised that Rx refills may take up to 3 business days. We ask that you follow-up with your pharmacy.

## 2023-07-11 ENCOUNTER — Other Ambulatory Visit: Payer: Self-pay | Admitting: Family

## 2023-07-11 DIAGNOSIS — I10 Essential (primary) hypertension: Secondary | ICD-10-CM

## 2023-07-19 ENCOUNTER — Telehealth: Payer: Self-pay | Admitting: Family

## 2023-07-19 DIAGNOSIS — I10 Essential (primary) hypertension: Secondary | ICD-10-CM

## 2023-07-19 NOTE — Telephone Encounter (Signed)
 Copied from CRM [id number]. Topic: Clinical - Medication Refill >> Jul 19, 2023  3:33 PM Lovett Ruck C wrote: Most Recent Primary Care Visit:  Provider: Felicita Horns  Department: [facility]  Visit Type: OFFICE VISIT  Date: 05/28/2023  Medication: metoprolol  succinate (TOPROL -XL) 100 MG 24 hr tablet  Has the patient contacted their pharmacy? Yes (Agent: If no, request that the patient contact the pharmacy for the refill. If patient does not wish to contact the pharmacy document the reason why and proceed with request.) (Agent: If yes, when and what did the pharmacy advise?)  Is this the correct pharmacy for this prescription? Yes If no, delete pharmacy and type the correct one.  This is the patient's preferred pharmacy:    [facility] - [place] - [address] [place] [address] Phone: [phone] Fax: [phone]   Has the prescription been filled recently? No  Is the patient out of the medication? Unknown- call was from [facility] agent and she did not have that information  Has the patient been seen for an appointment in the last year OR does the patient have an upcoming appointment? Yes  Can we respond through MyChart? Yes  Agent: Please be advised that Rx refills may take up to 3 business days. We ask that you follow-up with your pharmacy.

## 2023-07-19 NOTE — Telephone Encounter (Signed)
 RN spoke with [facility] agent. Nothing needed at this time.   Copied from CRM [id number]. Topic: Clinical - Medication Refill >> Jul 19, 2023  3:33 PM Lovett Ruck C wrote: Most Recent Primary Care Visit:  Provider: Felicita Horns  Department: [facility]  Visit Type: OFFICE VISIT  Date: 05/28/2023  Medication: metoprolol  succinate (TOPROL -XL) 100 MG 24 hr tablet  Has the patient contacted their pharmacy? Yes (Agent: If no, request that the patient contact the pharmacy for the refill. If patient does not wish to contact the pharmacy document the reason why and proceed with request.) (Agent: If yes, when and what did the pharmacy advise?)  Is this the correct pharmacy for this prescription? Yes If no, delete pharmacy and type the correct one.  This is the patient's preferred pharmacy:    [facility] [place] - [address] [place] [address] Phone: [phone] Fax: [phone]   Has the prescription been filled recently? No  Is the patient out of the medication? Unknown- call was from [facility] agent and she did not have that information  Has the patient been seen for an appointment in the last year OR does the patient have an upcoming appointment? Yes  Can we respond through MyChart? Yes  Agent: Please be advised that Rx refills may take up to 3 business days. We ask that you follow-up with your pharmacy.

## 2023-07-20 MED ORDER — METOPROLOL SUCCINATE ER 100 MG PO TB24: 100.0000 mg | ORAL_TABLET | Freq: Every day | ORAL | 3 refills | Status: DC

## 2023-07-27 ENCOUNTER — Encounter: Payer: Self-pay | Admitting: Family

## 2023-07-27 ENCOUNTER — Other Ambulatory Visit: Payer: Self-pay | Admitting: Family

## 2023-07-27 DIAGNOSIS — E1140 Type 2 diabetes mellitus with diabetic neuropathy, unspecified: Secondary | ICD-10-CM

## 2023-08-08 ENCOUNTER — Other Ambulatory Visit: Payer: Self-pay | Admitting: Family

## 2023-08-08 DIAGNOSIS — I10 Essential (primary) hypertension: Secondary | ICD-10-CM

## 2023-08-08 MED ORDER — METOPROLOL SUCCINATE ER 100 MG PO TB24: 100.0000 mg | ORAL_TABLET | Freq: Every day | ORAL | 3 refills | Status: DC

## 2023-08-08 NOTE — Telephone Encounter (Signed)
 Last Fill: 07/20/23 Pt is requesting sent to mail order   Last OV: 05/28/23 Next OV: 02/21/24 AWV   Routing to provider for review/authorization.

## 2023-08-08 NOTE — Telephone Encounter (Signed)
 Copied from CRM [id number]. Topic: Clinical - Medication Refill >> Aug 08, 2023 10:17 AM Adaysia C wrote: Medication: metoprolol  succinate (TOPROL -XL) 100 MG 24 hr tablet  Has the patient contacted their pharmacy? Yes, patients pharmacy did not receive the RX refill because it was sent to the wrong preferred pharmacy (Agent: If no, request that the patient contact the pharmacy for the refill. If patient does not wish to contact the pharmacy document the reason why and proceed with request.) (Agent: If yes, when and what did the pharmacy advise?)  This is the patient's preferred pharmacy:  [facility] [place] - [address] [place] [address] Phone: [phone] Fax: [phone]  Is this the correct pharmacy for this prescription? Yes If no, delete pharmacy and type the correct one.   Has the prescription been filled recently? Yes  Is the patient out of the medication? Yes  Has the patient been seen for an appointment in the last year OR does the patient have an upcoming appointment? Yes  Can we respond through MyChart? Yes  Agent: Please be advised that Rx refills may take up to 3 business days. We ask that you follow-up with your pharmacy.

## 2023-08-15 ENCOUNTER — Telehealth: Payer: Self-pay | Admitting: Family

## 2023-08-15 DIAGNOSIS — E1140 Type 2 diabetes mellitus with diabetic neuropathy, unspecified: Secondary | ICD-10-CM

## 2023-08-15 DIAGNOSIS — M791 Myalgia, unspecified site: Secondary | ICD-10-CM

## 2023-08-15 NOTE — Telephone Encounter (Unsigned)
 Copied from CRM [id number]. Topic: Clinical - Medication Refill >> Aug 15, 2023  5:42 PM Chuck Crater wrote: Medication: tiZANidine  (ZANAFLEX ) 4 MG tablet and OZEMPIC , 1 MG/DOSE, 4 MG/3ML SOPN   Has the patient contacted their pharmacy? Yes (Agent: If no, request that the patient contact the pharmacy for the refill. If patient does not wish to contact the pharmacy document the reason why and proceed with request.) (Agent: If yes, when and what did the pharmacy advise?) n/a  This is the patient's preferred pharmacy:   [facility] [place] - [address] [place] [address] Phone: [phone] Fax: [phone]  Is this the correct pharmacy for this prescription? Yes If no, delete pharmacy and type the correct one.   Has the prescription been filled recently? no  Is the patient out of the medication? Yes  Has the patient been seen for an appointment in the last year OR does the patient have an upcoming appointment? Yes  Can we respond through MyChart? Yes  Agent: Please be advised that Rx refills may take up to 3 business days. We ask that you follow-up with your pharmacy.

## 2023-08-16 MED ORDER — OZEMPIC (1 MG/DOSE) 4 MG/3ML ~~LOC~~ SOPN: 1.0000 mg | PEN_INJECTOR | SUBCUTANEOUS | 0 refills | Status: DC

## 2023-08-16 MED ORDER — TIZANIDINE HCL 4 MG PO TABS: ORAL_TABLET | ORAL | 0 refills | Status: AC

## 2023-08-16 NOTE — Telephone Encounter (Signed)
 Called pt. No answer and Unable to leave voicemail.

## 2023-08-17 NOTE — Telephone Encounter (Signed)
 Called  pt and schedule a appt for f/u

## 2023-08-22 ENCOUNTER — Other Ambulatory Visit: Payer: Self-pay | Admitting: Family

## 2023-08-22 DIAGNOSIS — E1140 Type 2 diabetes mellitus with diabetic neuropathy, unspecified: Secondary | ICD-10-CM

## 2023-08-24 ENCOUNTER — Other Ambulatory Visit: Payer: Self-pay | Admitting: Family

## 2023-08-24 DIAGNOSIS — I10 Essential (primary) hypertension: Secondary | ICD-10-CM

## 2023-08-24 NOTE — Telephone Encounter (Signed)
 Copied from CRM [id number]. Topic: Clinical - Medication Refill >> Aug 24, 2023  5:22 PM Baldo Levan wrote: Medication:  metoprolol  succinate metoprolol  succinate (TOPROL -XL) 100 MG 24 hr tablet   Has the patient contacted their pharmacy? Yes (Agent: If no, request that the patient contact the pharmacy for the refill. If patient does not wish to contact the pharmacy document the reason why and proceed with request.) (Agent: If yes, when and what did the pharmacy advise?)  This is the patient's preferred pharmacy:   [facility] [place] - [address] [place] [address] Phone: [phone] Fax: [phone]  Is this the correct pharmacy for this prescription? Yes If no, delete pharmacy and type the correct one.   Has the prescription been filled recently? No  Is the patient out of the medication? Yes  Has the patient been seen for an appointment in the last year OR does the patient have an upcoming appointment? Yes  Can we respond through MyChart? Yes  Agent: Please be advised that Rx refills may take up to 3 business days. We ask that you follow-up with your pharmacy.

## 2023-08-27 MED ORDER — METOPROLOL SUCCINATE ER 100 MG PO TB24: 100.0000 mg | ORAL_TABLET | Freq: Every day | ORAL | 3 refills | Status: AC

## 2023-08-30 ENCOUNTER — Telehealth: Payer: Self-pay | Admitting: Family

## 2023-08-30 ENCOUNTER — Encounter: Payer: Self-pay | Admitting: Family

## 2023-08-30 ENCOUNTER — Ambulatory Visit: Admitting: Family

## 2023-08-30 VITALS — BP 132/86 | HR 63 | Temp 98.3°F | Ht 72.0 in | Wt 231.6 lb

## 2023-08-30 DIAGNOSIS — E782 Mixed hyperlipidemia: Secondary | ICD-10-CM

## 2023-08-30 DIAGNOSIS — E1140 Type 2 diabetes mellitus with diabetic neuropathy, unspecified: Secondary | ICD-10-CM | POA: Diagnosis not present

## 2023-08-30 DIAGNOSIS — E559 Vitamin D deficiency, unspecified: Secondary | ICD-10-CM | POA: Diagnosis not present

## 2023-08-30 DIAGNOSIS — Z7985 Long-term (current) use of injectable non-insulin antidiabetic drugs: Secondary | ICD-10-CM

## 2023-08-30 DIAGNOSIS — I10 Essential (primary) hypertension: Secondary | ICD-10-CM

## 2023-08-30 NOTE — Telephone Encounter (Signed)
 Called patient to come back to the lab forgot to collect Urine when patient was here for lab work.

## 2023-08-30 NOTE — Assessment & Plan Note (Signed)
 Ordered lipid panel, pending results. Work on low cholesterol diet and exercise as tolerated Continue atorvastatin  80 mg nightly

## 2023-08-30 NOTE — Assessment & Plan Note (Signed)
 Ordered vitamin d pending results.  Encouraged vitamin D and calcium 

## 2023-08-30 NOTE — Progress Notes (Signed)
 Established Patient Office Visit  Subjective:      CC:  Chief Complaint  Patient presents with   Medical Management of Chronic Issues    HPI: Olivia Kirk is a 68 y.o. female presenting on 08/30/2023 for Medical Management of Chronic Issues . DM2: doing well on ozempic  1 mg weekly. Has lost about 13 pounds. No established physical exercise routine. Works on diet. She has not yet seen a dietitician referred lats time but she might have missed the call. She is not sure.  She is no longer taking metformin .  Wt Readings from Last 3 Encounters:  08/30/23 231 lb 9.6 oz (105.1 kg)  05/28/23 243 lb (110.2 kg)  03/01/23 244 lb 9.6 oz (110.9 kg)   HLD: working on diet and taking atorvastatin  80 mg nightly, no myalgias tolerating well.   HTN: on metoprolol  losartan  and amlodipine . Today within goal range 132/86.        Social history:  Relevant past medical, surgical, family and social history reviewed and updated as indicated. Interim medical history since our last visit reviewed.  Allergies and medications reviewed and updated.  DATA REVIEWED: CHART IN EPIC     ROS: Negative unless specifically indicated above in HPI.    Current Outpatient Medications:    amLODipine  (NORVASC ) 5 MG tablet, TAKE ONE TABLET (5 MG TOTAL) BY MOUTH DAILY AT 9AM, Disp: 90 tablet, Rfl: 1   atorvastatin  (LIPITOR) 80 MG tablet, Take 1 tablet (80 mg total) by mouth daily., Disp: 90 tablet, Rfl: 1   Bioflavonoid Products (BIOFLEX PO), Take by mouth daily., Disp: , Rfl:    Biotin 1000 MCG tablet, Take by mouth daily., Disp: , Rfl:    Calcium  Carbonate (CALCIUM  600 PO), Take by mouth daily., Disp: , Rfl:    Cholecalciferol (VITAMIN D3) 1000 units CAPS, Take by mouth., Disp: , Rfl:    hydrochlorothiazide  (HYDRODIURIL ) 25 MG tablet, Take 1 tablet (25 mg total) by mouth daily., Disp: 90 tablet, Rfl: 1   losartan  (COZAAR ) 100 MG tablet, Take 1 tablet (100 mg total) by mouth daily., Disp: 90 tablet,  Rfl: 1   metFORMIN  (GLUCOPHAGE -XR) 500 MG 24 hr tablet, Take 1 tablet (500 mg total) by mouth daily with breakfast., Disp: 90 tablet, Rfl: 1   metoprolol  succinate (TOPROL -XL) 100 MG 24 hr tablet, Take 1 tablet (100 mg total) by mouth daily. Take with or immediately following a meal., Disp: 90 tablet, Rfl: 3   Multiple Vitamins-Minerals (ALIVE WOMENS 50+ PO), Take by mouth daily., Disp: , Rfl:    Omega-3 Fatty Acids (FISH OIL) 1000 MG CAPS, Take by mouth daily., Disp: , Rfl:    Semaglutide , 1 MG/DOSE, (OZEMPIC , 1 MG/DOSE,) 4 MG/3ML SOPN, Inject 1 mg into the skin once a week., Disp: 3 mL, Rfl: 0   sertraline  (ZOLOFT ) 100 MG tablet, Take 1 tablet (100 mg total) by mouth daily., Disp: 90 tablet, Rfl: 1   tiZANidine  (ZANAFLEX ) 4 MG tablet, 1/2 to one tablet po at bedtime prn muscle spasm, Disp: 30 tablet, Rfl: 0   vitamin B-12 (CYANOCOBALAMIN ) 1000 MCG tablet, Take 1,000 mcg by mouth daily., Disp: , Rfl:       Objective:    BP 132/86 (BP Location: Left Arm, Patient Position: Sitting, Cuff Size: Large)   Pulse 63   Temp 98.3 F (36.8 C) (Temporal)   Ht 6' (1.829 m)   Wt 231 lb 9.6 oz (105.1 kg)   SpO2 97%   BMI 31.41 kg/m   Wt  Readings from Last 3 Encounters:  08/30/23 231 lb 9.6 oz (105.1 kg)  05/28/23 243 lb (110.2 kg)  03/01/23 244 lb 9.6 oz (110.9 kg)    Physical Exam Vitals reviewed.  Constitutional:      General: She is not in acute distress.    Appearance: Normal appearance. She is normal weight. She is not ill-appearing, toxic-appearing or diaphoretic.  HENT:     Head: Normocephalic.   Cardiovascular:     Rate and Rhythm: Normal rate and regular rhythm.  Pulmonary:     Effort: Pulmonary effort is normal.   Musculoskeletal:        General: Normal range of motion.     Right lower leg: No edema.     Left lower leg: No edema.   Neurological:     General: No focal deficit present.     Mental Status: She is alert and oriented to person, place, and time. Mental status is  at baseline.   Psychiatric:        Mood and Affect: Mood normal.        Behavior: Behavior normal.        Thought Content: Thought content normal.        Judgment: Judgment normal.           Assessment & Plan:  Mixed hyperlipidemia Assessment & Plan: Ordered lipid panel, pending results. Work on low cholesterol diet and exercise as tolerated Continue atorvastatin  80 mg nightly   Orders: -     Lipid panel  Type 2 diabetes mellitus with diabetic neuropathy, without long-term current use of insulin (HCC) Assessment & Plan: Continue ozempic  1 mg weekly  A1c today pending results.  Encouraged to start diabetic diet and exercise.    Orders: -     Microalbumin / creatinine urine ratio -     Hemoglobin A1c -     Comprehensive metabolic panel with GFR  Essential hypertension Assessment & Plan: Stable patient to continue metoprolol  100 mg XL once daily, losartan  100 mg tablet once daily, amlodipine  5 mg once daily.  Maintain low-sodium diet.   Vitamin D deficiency Assessment & Plan: Ordered vitamin d pending results.  Encouraged vitamin D and calcium    Orders: -     VITAMIN D 25 Hydroxy (Vit-D Deficiency, Fractures)     Return in about 6 months (around 02/29/2024) for f/u CPE.  Felicita Horns, MSN, APRN, FNP-C [facility]

## 2023-08-30 NOTE — Assessment & Plan Note (Signed)
 Continue ozempic  1 mg weekly  A1c today pending results.  Encouraged to start diabetic diet and exercise.

## 2023-08-30 NOTE — Assessment & Plan Note (Signed)
Stable patient to continue metoprolol 100 mg XL once daily, losartan 100 mg tablet once daily, amlodipine 5 mg once daily.  Maintain low-sodium diet.

## 2023-09-03 ENCOUNTER — Ambulatory Visit: Payer: Self-pay | Admitting: Family

## 2023-09-04 LAB — COMPREHENSIVE METABOLIC PANEL WITH GFR
AG Ratio: 1.5 (calc) (ref 1.0–2.5)
ALT: 9 U/L (ref 6–29)
AST: 13 U/L (ref 10–35)
Albumin: 4.3 g/dL (ref 3.6–5.1)
Alkaline phosphatase (APISO): 68 U/L (ref 37–153)
BUN: 15 mg/dL (ref 7–25)
CO2: 28 mmol/L (ref 20–32)
Calcium: 10.1 mg/dL (ref 8.6–10.4)
Chloride: 103 mmol/L (ref 98–110)
Creat: 0.99 mg/dL (ref 0.50–1.05)
Globulin: 2.8 g/dL (ref 1.9–3.7)
Glucose, Bld: 100 mg/dL — ABNORMAL HIGH (ref 65–99)
Potassium: 3.4 mmol/L — ABNORMAL LOW (ref 3.5–5.3)
Sodium: 143 mmol/L (ref 135–146)
Total Bilirubin: 0.4 mg/dL (ref 0.2–1.2)
Total Protein: 7.1 g/dL (ref 6.1–8.1)
eGFR: 62 mL/min/{1.73_m2} (ref >=60)

## 2023-09-04 LAB — LIPID PANEL
Cholesterol: 135 mg/dL (ref <200)
HDL: 37 mg/dL — ABNORMAL LOW (ref >=50)
LDL Cholesterol (Calc): 78 mg/dL
Non-HDL Cholesterol (Calc): 98 mg/dL (ref <130)
Total CHOL/HDL Ratio: 3.6 (calc) (ref <5.0)
Triglycerides: 113 mg/dL (ref <150)

## 2023-09-04 LAB — HEMOGLOBIN A1C
Hgb A1c MFr Bld: 6.4 % — ABNORMAL HIGH (ref <5.7)
Mean Plasma Glucose: 137 mg/dL
eAG (mmol/L): 7.6 mmol/L

## 2023-09-04 LAB — VITAMIN D 25 HYDROXY (VIT D DEFICIENCY, FRACTURES): Vit D, 25-Hydroxy: 37 ng/mL (ref 30–100)

## 2023-10-08 ENCOUNTER — Encounter: Payer: Self-pay | Admitting: Family

## 2023-10-09 NOTE — Telephone Encounter (Signed)
 Have pt make appt so I can evaluate her appropriately for new concern.

## 2023-10-16 ENCOUNTER — Ambulatory Visit (INDEPENDENT_AMBULATORY_CARE_PROVIDER_SITE_OTHER): Admitting: Family

## 2023-10-16 ENCOUNTER — Encounter: Payer: Self-pay | Admitting: Family

## 2023-10-16 VITALS — BP 126/80 | HR 70 | Temp 98.4°F | Wt 237.0 lb

## 2023-10-16 DIAGNOSIS — E1140 Type 2 diabetes mellitus with diabetic neuropathy, unspecified: Secondary | ICD-10-CM

## 2023-10-16 DIAGNOSIS — Z79899 Other long term (current) drug therapy: Secondary | ICD-10-CM | POA: Diagnosis not present

## 2023-10-16 DIAGNOSIS — Z7985 Long-term (current) use of injectable non-insulin antidiabetic drugs: Secondary | ICD-10-CM

## 2023-10-16 DIAGNOSIS — F5101 Primary insomnia: Secondary | ICD-10-CM | POA: Diagnosis not present

## 2023-10-16 DIAGNOSIS — R251 Tremor, unspecified: Secondary | ICD-10-CM | POA: Insufficient documentation

## 2023-10-16 LAB — T4, FREE: Free T4: 0.76 ng/dL (ref 0.60–1.60)

## 2023-10-16 LAB — TSH: TSH: 2.13 u[IU]/mL (ref 0.35–5.50)

## 2023-10-16 LAB — T3, FREE: T3, Free: 2.9 pg/mL (ref 2.3–4.2)

## 2023-10-16 MED ORDER — ESZOPICLONE 1 MG PO TABS: 1.0000 mg | ORAL_TABLET | Freq: Every evening | ORAL | 2 refills | Status: AC | PRN

## 2023-10-16 MED ORDER — OZEMPIC (0.25 OR 0.5 MG/DOSE) 2 MG/3ML ~~LOC~~ SOPN
0.5000 mg | PEN_INJECTOR | SUBCUTANEOUS | 0 refills | Status: AC
Start: 2023-11-06 — End: ?

## 2023-10-16 MED ORDER — OZEMPIC (0.25 OR 0.5 MG/DOSE) 2 MG/3ML ~~LOC~~ SOPN: 0.2500 mg | PEN_INJECTOR | SUBCUTANEOUS | 0 refills | Status: AC

## 2023-10-16 NOTE — Patient Instructions (Signed)
  Try out lunesta  1 mg at night time for sleep  -Sleep only long enough to feel rested then get out of bed -Go to bed and get up at the same time every day. -Do not try to force yourself to sleep. If you can't sleep, get out of bed adn try again later. -Have coffee, tea, and other foods that have caffeine only in the morning. -Avoid alcohol -Keep your bedroom dark, cool, quiet, and free of reminders of work or other things that cause you stress -Exercise several days a week, but not right before bed -Avoid looking at phones or reading devices (e-books) that give off light before bed. This can make it harder to fall asleep  Restart ozempic  at 0.25 mg weekly

## 2023-10-16 NOTE — Progress Notes (Signed)
 Established Patient Office Visit  Subjective:   Patient ID: Olivia Kirk, female    DOB: December 29, 1955  Age: 68 y.o. MRN: [id number]  CC:  Chief Complaint  Patient presents with   Medical Management of Chronic Issues    HPI: Olivia Kirk is a 68 y.o. female presenting on 10/16/2023 for Medical Management of Chronic Issues  Daughter accompanying her to visit.  They have noticed she has bil tremors in her hands more often than not.  She has trouble carrying plates and writing because she is tremoring so much.  It has worsened in the last few months. She states she has not noticed pill rolling does not shuffle when she walks. Does not know anyone in the family that has had tremors. She states the tremors are all of the time.   CMP reviewed normal overall   Hard for her to sleep through the night.  Will go to sleep at 2 am and wake up around 7 am.  Sometimes can't sleep at all during the night on average 3-5 hours a night.  Does drink soda and coffee regularly, cokes 1-2 a day only one cup of coffee in am.  DM, pt self d/c ozempic  because her RX ran out and she didn't get a refill. She was tolerating well at 1 mg.      ROS: Negative unless specifically indicated above in HPI.   Relevant past medical history reviewed and updated as indicated.   Allergies and medications reviewed and updated.   Current Outpatient Medications:    amLODipine  (NORVASC ) 5 MG tablet, TAKE ONE TABLET (5 MG TOTAL) BY MOUTH DAILY AT 9AM, Disp: 90 tablet, Rfl: 1   atorvastatin  (LIPITOR) 80 MG tablet, Take 1 tablet (80 mg total) by mouth daily., Disp: 90 tablet, Rfl: 1   Bioflavonoid Products (BIOFLEX PO), Take by mouth daily., Disp: , Rfl:    Biotin 1000 MCG tablet, Take by mouth daily., Disp: , Rfl:    Calcium  Carbonate (CALCIUM  600 PO), Take by mouth daily., Disp: , Rfl:    Cholecalciferol (VITAMIN D3) 1000 units CAPS, Take by mouth., Disp: , Rfl:    eszopiclone  (LUNESTA ) 1 MG TABS tablet,  Take 1 tablet (1 mg total) by mouth at bedtime as needed for sleep. Take immediately before bedtime, Disp: 30 tablet, Rfl: 2   hydrochlorothiazide  (HYDRODIURIL ) 25 MG tablet, Take 1 tablet (25 mg total) by mouth daily., Disp: 90 tablet, Rfl: 1   losartan  (COZAAR ) 100 MG tablet, Take 1 tablet (100 mg total) by mouth daily., Disp: 90 tablet, Rfl: 1   metFORMIN  (GLUCOPHAGE -XR) 500 MG 24 hr tablet, Take 1 tablet (500 mg total) by mouth daily with breakfast., Disp: 90 tablet, Rfl: 1   metoprolol  succinate (TOPROL -XL) 100 MG 24 hr tablet, Take 1 tablet (100 mg total) by mouth daily. Take with or immediately following a meal., Disp: 90 tablet, Rfl: 3   Multiple Vitamins-Minerals (ALIVE WOMENS 50+ PO), Take by mouth daily., Disp: , Rfl:    Omega-3 Fatty Acids (FISH OIL) 1000 MG CAPS, Take by mouth daily., Disp: , Rfl:    Semaglutide ,0.25 or 0.5MG /DOS, (OZEMPIC , 0.25 OR 0.5 MG/DOSE,) 2 MG/3ML SOPN, Inject 0.25 mg into the skin once a week., Disp: 3 mL, Rfl: 0   [START ON 11/06/2023] Semaglutide ,0.25 or 0.5MG /DOS, (OZEMPIC , 0.25 OR 0.5 MG/DOSE,) 2 MG/3ML SOPN, Inject 0.5 mg into the skin once a week. Inject 0.25 mg into the skin once weekly for 4 weeks, then increase to 0.5 mg  once weekly thereafter for diabetes., Disp: 6 mL, Rfl: 0   sertraline  (ZOLOFT ) 100 MG tablet, Take 1 tablet (100 mg total) by mouth daily., Disp: 90 tablet, Rfl: 1   tiZANidine  (ZANAFLEX ) 4 MG tablet, 1/2 to one tablet po at bedtime prn muscle spasm, Disp: 30 tablet, Rfl: 0   vitamin B-12 (CYANOCOBALAMIN ) 1000 MCG tablet, Take 1,000 mcg by mouth daily., Disp: , Rfl:   Allergies  Allergen Reactions   Guaifenesin & Derivatives Itching, Dermatitis and Rash   Blueberry Flavoring Agent (Non-Screening) Itching and Rash    Food allergy to blueberry    Objective:   BP 126/80 (BP Location: Left Arm, Patient Position: Sitting, Cuff Size: Large)   Pulse 70   Temp 98.4 F (36.9 C) (Temporal)   Wt 237 lb (107.5 kg)   SpO2 98%   BMI 32.14  kg/m    Physical Exam Eyes:     General: No visual field deficit. Neurological:     General: No focal deficit present.     Mental Status: She is alert and oriented to person, place, and time.     Cranial Nerves: Cranial nerves 2-12 are intact. No cranial nerve deficit or facial asymmetry.     Sensory: Sensation is intact. No sensory deficit.     Motor: Tremor (bil) present. No pronator drift.     Coordination: Romberg sign negative. Coordination normal. Heel to [facility] Test abnormal. Rapid alternating movements normal.     Gait: Gait is intact.     Assessment & Plan:  Tremor of both hands Assessment & Plan: New and worsening gradually interfering with daily life  Discussed limiting caffeine use, and trial medication for sleep as pt does not sleep sufficiently.  Referral to neurology as well.  Overall physical exam reassuring in terms of parkinsons concern.  Thyroid  panel ordered to r/o thyroid  process Cmp reviewed normal findings   Orders: -     TSH -     T3, free -     T4, free -     Ambulatory referral to Neurology  Type 2 diabetes mellitus with diabetic neuropathy, without long-term current use of insulin (HCC) Assessment & Plan: Restart ozempic  at 0.25 mg weekly Rtc three months  Orders: -     Ozempic  (0.25 or 0.5 MG/DOSE); Inject 0.25 mg into the skin once a week.  Dispense: 3 mL; Refill: 0 -     Ozempic  (0.25 or 0.5 MG/DOSE); Inject 0.5 mg into the skin once a week. Inject 0.25 mg into the skin once weekly for 4 weeks, then increase to 0.5 mg once weekly thereafter for diabetes.  Dispense: 6 mL; Refill: 0  Primary insomnia Assessment & Plan: Trial lunesta  1 mg nightly  Discussed potential s/e.  Pdmp reviewed UDS today drug contract signed. Discussed proper sleep hygiene  Orders: -     Eszopiclone ; Take 1 tablet (1 mg total) by mouth at bedtime as needed for sleep. Take immediately before bedtime  Dispense: 30 tablet; Refill: 2  High risk medication use -      DRUG MONITORING, PANEL 8 WITH CONFIRMATION, URINE     Follow up plan: Return in about 3 months (around 01/16/2024), or if symptoms worsen or fail to improve, for f/u diabetes and sleep .  Ginger Patrick, FNP

## 2023-10-16 NOTE — Assessment & Plan Note (Signed)
 Restart ozempic  at 0.25 mg weekly Rtc three months

## 2023-10-16 NOTE — Assessment & Plan Note (Signed)
 Trial lunesta  1 mg nightly  Discussed potential s/e.  Pdmp reviewed UDS today drug contract signed. Discussed proper sleep hygiene

## 2023-10-16 NOTE — Assessment & Plan Note (Addendum)
 New and worsening gradually interfering with daily life  Discussed limiting caffeine use, and trial medication for sleep as pt does not sleep sufficiently.  Referral to neurology as well.  Overall physical exam reassuring in terms of parkinsons concern.  Thyroid  panel ordered to r/o thyroid  process Cmp reviewed normal findings

## 2023-10-17 ENCOUNTER — Ambulatory Visit: Payer: Self-pay | Admitting: Family

## 2023-10-17 LAB — DM TEMPLATE

## 2023-10-17 LAB — DRUG MONITORING, PANEL 8 WITH CONFIRMATION, URINE
6 Acetylmorphine: NEGATIVE ng/mL (ref <10)
Alcohol Metabolites: NEGATIVE ng/mL (ref <500)
Amphetamines: NEGATIVE ng/mL (ref <500)
Benzodiazepines: NEGATIVE ng/mL (ref <100)
Buprenorphine, Urine: NEGATIVE ng/mL (ref <5)
Cocaine Metabolite: NEGATIVE ng/mL (ref <150)
Creatinine: 134.5 mg/dL (ref >=20.0)
MDMA: NEGATIVE ng/mL (ref <500)
Marijuana Metabolite: NEGATIVE ng/mL (ref <20)
Opiates: NEGATIVE ng/mL (ref <100)
Oxidant: NEGATIVE ug/mL (ref <200)
Oxycodone: NEGATIVE ng/mL (ref <100)
pH: 6.9 (ref 4.5–9.0)

## 2023-11-29 ENCOUNTER — Other Ambulatory Visit: Payer: Self-pay | Admitting: Family

## 2023-11-29 DIAGNOSIS — E1140 Type 2 diabetes mellitus with diabetic neuropathy, unspecified: Secondary | ICD-10-CM

## 2023-12-04 ENCOUNTER — Other Ambulatory Visit: Payer: Self-pay | Admitting: Family

## 2023-12-04 DIAGNOSIS — E1140 Type 2 diabetes mellitus with diabetic neuropathy, unspecified: Secondary | ICD-10-CM

## 2023-12-04 NOTE — Telephone Encounter (Unsigned)
 Copied from CRM [id number]. Topic: Clinical - Medication Refill >> Dec 04, 2023  2:39 PM Franky GRADE wrote: Medication: Semaglutide ,0.25 or 0.5MG /DOS, (OZEMPIC , 0.25 OR 0.5 MG/DOSE,) 2 MG/3ML SOPN [[id number]]  Has the patient contacted their pharmacy? Yes, [facility] is calling to place the request.  (Agent: If no, request that the patient contact the pharmacy for the refill. If patient does not wish to contact the pharmacy document the reason why and proceed with request.) (Agent: If yes, when and what did the pharmacy advise?)  This is the patient's preferred pharmacy:  [facility] [place] [address] [place] [address] Phone: [phone] Fax: [phone]  Is this the correct pharmacy for this prescription? Yes If no, delete pharmacy and type the correct one.   Has the prescription been filled recently? No  Is the patient out of the medication? Unsure  Has the patient been seen for an appointment in the last year OR does the patient have an upcoming appointment? Yes  Can we respond through MyChart? Yes  Agent: Please be advised that Rx refills may take up to 3 business days. We ask that you follow-up with your pharmacy.

## 2023-12-07 ENCOUNTER — Other Ambulatory Visit: Payer: Self-pay | Admitting: Family

## 2023-12-07 DIAGNOSIS — E785 Hyperlipidemia, unspecified: Secondary | ICD-10-CM

## 2023-12-07 DIAGNOSIS — I10 Essential (primary) hypertension: Secondary | ICD-10-CM

## 2023-12-07 DIAGNOSIS — E119 Type 2 diabetes mellitus without complications: Secondary | ICD-10-CM

## 2023-12-20 ENCOUNTER — Other Ambulatory Visit: Payer: Self-pay | Admitting: Family

## 2023-12-20 DIAGNOSIS — E1140 Type 2 diabetes mellitus with diabetic neuropathy, unspecified: Secondary | ICD-10-CM

## 2023-12-25 ENCOUNTER — Other Ambulatory Visit: Payer: Self-pay | Admitting: Family

## 2023-12-25 DIAGNOSIS — E119 Type 2 diabetes mellitus without complications: Secondary | ICD-10-CM

## 2023-12-25 DIAGNOSIS — I10 Essential (primary) hypertension: Secondary | ICD-10-CM

## 2023-12-25 DIAGNOSIS — E785 Hyperlipidemia, unspecified: Secondary | ICD-10-CM

## 2023-12-25 NOTE — Telephone Encounter (Signed)
 Copied from CRM [id number]. Topic: Clinical - Medication Refill >> Dec 25, 2023  3:32 PM Thersia C wrote: Medication: amLODipine  (NORVASC ) 5 MG tablet  metFORMIN  (GLUCOPHAGE -XR) 500 MG 24 hr tablet  hydrochlorothiazide  (HYDRODIURIL ) 25 MG tablet   atorvastatin  (LIPITOR) 80 MG tablet   Has the patient contacted their pharmacy? Yes (Agent: If no, request that the patient contact the pharmacy for the refill. If patient does not wish to contact the pharmacy document the reason why and proceed with request.) (Agent: If yes, when and what did the pharmacy advise?)  This is the patient's preferred pharmacy:  [facility] - [place] - [address] [place] [address] Phone: [phone] Fax: [phone]  Is this the correct pharmacy for this prescription? Yes If no, delete pharmacy and type the correct one.   Has the prescription been filled recently? No  Is the patient out of the medication? Yes  Has the patient been seen for an appointment in the last year OR does the patient have an upcoming appointment? Yes  Can we respond through MyChart? Yes  Agent: Please be advised that Rx refills may take up to 3 business days. We ask that you follow-up with your pharmacy.

## 2023-12-27 ENCOUNTER — Other Ambulatory Visit: Payer: Self-pay | Admitting: Family

## 2023-12-27 DIAGNOSIS — E1140 Type 2 diabetes mellitus with diabetic neuropathy, unspecified: Secondary | ICD-10-CM

## 2023-12-27 NOTE — Telephone Encounter (Signed)
 Copied from CRM [id number]. Topic: Clinical - Medication Refill >> Dec 27, 2023 12:18 PM Franky GRADE wrote: Medication: Semaglutide ,0.25 or 0.5MG /DOS, (OZEMPIC , 0.25 OR 0.5 MG/DOSE,) 2 MG/3ML SOPN [[id number]]  Has the patient contacted their pharmacy? Yes, pharmacy is calling to place the refill.  (Agent: If no, request that the patient contact the pharmacy for the refill. If patient does not wish to contact the pharmacy document the reason why and proceed with request.) (Agent: If yes, when and what did the pharmacy advise?)  This is the patient's preferred pharmacy:  [facility] - [place] - [address] [place] [address] Phone: [phone] Fax: [phone]  Is this the correct pharmacy for this prescription? Yes If no, delete pharmacy and type the correct one.   Has the prescription been filled recently? No  Is the patient out of the medication? Unsure as the pharmacy is calling to place the request.   Has the patient been seen for an appointment in the last year OR does the patient have an upcoming appointment? Yes  Can we respond through MyChart? Yes  Agent: Please be advised that Rx refills may take up to 3 business days. We ask that you follow-up with your pharmacy.

## 2023-12-31 ENCOUNTER — Other Ambulatory Visit: Payer: Self-pay | Admitting: Family

## 2023-12-31 DIAGNOSIS — I10 Essential (primary) hypertension: Secondary | ICD-10-CM

## 2023-12-31 DIAGNOSIS — E119 Type 2 diabetes mellitus without complications: Secondary | ICD-10-CM

## 2023-12-31 DIAGNOSIS — E785 Hyperlipidemia, unspecified: Secondary | ICD-10-CM

## 2023-12-31 NOTE — Telephone Encounter (Signed)
 Copied from CRM [id number]. Topic: Clinical - Medication Refill >> Dec 31, 2023 11:52 AM Martin[place] E wrote: Medication: atorvastatin  (LIPITOR) 80 MG tablet  metFORMIN  (GLUCOPHAGE -XR) 500 MG 24 hr tablet amLODipine  (NORVASC ) 5 MG tablet hydrochlorothiazide  (HYDRODIURIL ) 25 MG tablet  Has the patient contacted their pharmacy? Yes (Agent: If no, request that the patient contact the pharmacy for the refill. If patient does not wish to contact the pharmacy document the reason why and proceed with request.) (Agent: If yes, when and what did the pharmacy advise?)  This is the patient's preferred pharmacy:  [facility] - [place] [address] [place] [address] Phone: [phone] Fax: [phone]  Is this the correct pharmacy for this prescription? Yes If no, delete pharmacy and type the correct one.   Has the prescription been filled recently? No  Is the patient out of the medication? Yes  Has the patient been seen for an appointment in the last year OR does the patient have an upcoming appointment? Yes  Can we respond through MyChart? Yes  Agent: Please be advised that Rx refills may take up to 3 business days. We ask that you follow-up with your pharmacy.

## 2024-01-05 ENCOUNTER — Other Ambulatory Visit: Payer: Self-pay | Admitting: Family

## 2024-01-05 DIAGNOSIS — F419 Anxiety disorder, unspecified: Secondary | ICD-10-CM

## 2024-01-07 NOTE — Progress Notes (Signed)
 Otto A [place]                                          MRN: [id number]   01/07/2024   The [facility] Quality Team Specialist reviewed this patient medical record for the purposes of chart review for care gap closure. The following were reviewed: chart review for care gap closure-kidney health evaluation for diabetes:eGFR  and uACR.    [facility]I Quality Team

## 2024-02-21 ENCOUNTER — Ambulatory Visit: Payer: Medicare HMO

## 2024-02-21 NOTE — Progress Notes (Unsigned)
 SABRA

## 2024-04-06 ENCOUNTER — Other Ambulatory Visit: Payer: Self-pay | Admitting: Family

## 2024-04-06 DIAGNOSIS — F419 Anxiety disorder, unspecified: Secondary | ICD-10-CM

## 2024-04-21 ENCOUNTER — Ambulatory Visit

## 2024-04-25 ENCOUNTER — Ambulatory Visit

## 2024-04-25 VITALS — BP 142/78 | HR 60 | Ht 71.0 in | Wt 237.8 lb

## 2024-04-25 DIAGNOSIS — Z0000 Encounter for general adult medical examination without abnormal findings: Secondary | ICD-10-CM

## 2024-04-25 NOTE — Progress Notes (Signed)
 "  Chief Complaint  Patient presents with   Medicare Wellness     Subjective:   Olivia Kirk is a 69 y.o. female who presents for a [employer] Visit.  Visit info / Clinical Intake: Medicare Wellness Visit Type:: Subsequent Annual Wellness Visit Persons participating in visit and providing information:: patient Medicare Wellness Visit Mode:: In-person (required for WTM) Interpreter Needed?: No Pre-visit prep was completed: yes AWV questionnaire completed by patient prior to visit?: yes Date:: 04/24/24 Living arrangements:: lives with spouse/significant other Patient's Overall Health Status Rating: (!) fair Typical amount of pain: some Does pain affect daily life?: (!) yes Are you currently prescribed opioids?: no  Dietary Habits and Nutritional Risks How many meals a day?: 2 Eats fruit and vegetables daily?: yes Most meals are obtained by: preparing own meals; eating out In the last 2 weeks, have you had any of the following?: none Diabetic:: (!) yes Any non-healing wounds?: no How often do you check your BS?: 1; as needed Would you like to be referred to a Nutritionist or for Diabetic Management? : no  Functional Status Activities of Daily Living (to include ambulation/medication): Independent Feeding: Independent (verified with pt) Dressing/Grooming: Independent Bathing: Independent Toileting: Independent Transfer: Independent Ambulation: Needs Assistance Medication Administration: Independent Home Management (perform basic housework or laundry): Independent Manage your own finances?: yes Primary transportation is: driving Concerns about vision?: (!) yes Concerns about hearing?: no  Fall Screening Falls in the past year?: 1 Number of falls in past year: 0 (1) Was there an injury with Fall?: 0 Fall Risk Category Calculator: 1 Patient Fall Risk Level: Low Fall Risk  Fall Risk Patient at Risk for Falls Due to: Other (Comment) (tripped by  mailbox) Fall risk Follow up: Falls evaluation completed; Falls prevention discussed  Home and Transportation Safety: All rugs have non-skid backing?: yes All stairs or steps have railings?: N/A, no stairs Grab bars in the bathtub or shower?: (!) no Have non-skid surface in bathtub or shower?: yes Good home lighting?: (!) no Regular seat belt use?: yes Hospital stays in the last year:: no  Cognitive Assessment Difficulty concentrating, remembering, or making decisions? : no Will 6CIT or Mini Cog be Completed: yes What year is it?: 0 points What month is it?: 0 points Give patient an address phrase to remember (5 components): [address] [place] About what time is it?: 0 points Count backwards from 20 to 1: 0 points Say the months of the year in reverse: 0 points Repeat the address phrase from earlier: 2 points (Drive) 6 CIT Score: 2 points  Advance Directives (For Healthcare) Does Patient Have a Medical Advance Directive?: No Would patient like information on creating a medical advance directive?: No - Patient declined  Reviewed/Updated  Reviewed/Updated: Reviewed All (Medical, Surgical, Family, Medications, Allergies, Care Teams, Patient Goals)    Allergies (verified) Guaifenesin & derivatives and Blueberry flavoring agent (non-screening)   Current Medications (verified) Outpatient Encounter Medications as of 04/25/2024  Medication Sig   amLODipine  (NORVASC ) 5 MG tablet TAKE ONE TABLET (5MG  TOTAL) BY MOUTH DAILY AT 9AM   atorvastatin  (LIPITOR) 80 MG tablet TAKE ONE TABLET (80MG  TOTAL) BY MOUTH DAILY AT 5PM   Bioflavonoid Products (BIOFLEX PO) Take by mouth daily.   Biotin 1000 MCG tablet Take by mouth daily.   Calcium  Carbonate (CALCIUM  600 PO) Take by mouth daily.   Cholecalciferol (VITAMIN D3) 1000 units CAPS Take by mouth.   eszopiclone  (LUNESTA ) 1 MG TABS tablet Take 1 tablet (  1 mg total) by mouth at bedtime as needed for sleep. Take immediately before bedtime    hydrochlorothiazide  (HYDRODIURIL ) 25 MG tablet TAKE ONE TABLET (25MG ) BY MOUTH DAILY AT 9AM   losartan  (COZAAR ) 100 MG tablet Take 1 tablet (100 mg total) by mouth daily.   metFORMIN  (GLUCOPHAGE -XR) 500 MG 24 hr tablet TAKE ONE TABLET (500MG  TOTAL) BY MOUTH DAILY AT 9AM WITH BREAKFAST   metoprolol  succinate (TOPROL -XL) 100 MG 24 hr tablet Take 1 tablet (100 mg total) by mouth daily. Take with or immediately following a meal.   Multiple Vitamins-Minerals (ALIVE WOMENS 50+ PO) Take by mouth daily.   Omega-3 Fatty Acids (FISH OIL) 1000 MG CAPS Take by mouth daily.   sertraline  (ZOLOFT ) 100 MG tablet TAKE ONE TABLET BY MOUTH (100MG  TOTAL) BY MOUTH DAILY AT 9AM   tiZANidine  (ZANAFLEX ) 4 MG tablet 1/2 to one tablet po at bedtime prn muscle spasm   vitamin B-12 (CYANOCOBALAMIN ) 1000 MCG tablet Take 1,000 mcg by mouth daily.   Semaglutide ,0.25 or 0.5MG /DOS, (OZEMPIC , 0.25 OR 0.5 MG/DOSE,) 2 MG/3ML SOPN Inject 0.25 mg into the skin once a week.   Semaglutide ,0.25 or 0.5MG /DOS, (OZEMPIC , 0.25 OR 0.5 MG/DOSE,) 2 MG/3ML SOPN Inject 0.5 mg into the skin once a week. Inject 0.25 mg into the skin once weekly for 4 weeks, then increase to 0.5 mg once weekly thereafter for diabetes.   No facility-administered encounter medications on file as of 04/25/2024.    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   GERD (gastroesophageal reflux disease)    RARE   Hyperlipidemia    Hypertension    Personal history of radiation therapy 2018   left breast cancer   Past Surgical History:  Procedure Laterality Date   BREAST BIOPSY Left 06/06/2016   left stereo. invasive mammary carcinoma   BREAST LUMPECTOMY Left 07/03/2016   invasive mammary carcinoma   BREAST LUMPECTOMY WITH NEEDLE LOCALIZATION Left 07/03/2016    Procedure: BREAST LUMPECTOMY WITH NEEDLE LOCALIZATION;  Surgeon: Reyes LELON Cota, MD;  Location: [facility] ORS;  Service: General;  Laterality: Left;   BREAST LUMPECTOMY WITH SENTINEL LYMPH NODE BIOPSY Left 07/03/2016   Procedure: BREAST LUMPECTOMY WITH SENTINEL LYMPH NODE BX;  Surgeon: Reyes LELON Cota, MD;  Location: [facility] ORS;  Service: General;  Laterality: Left;   COLONOSCOPY WITH PROPOFOL  N/A 03/01/2023   Procedure: COLONOSCOPY WITH PROPOFOL ;  Surgeon: Therisa Bi, MD;  Location: [facility] ENDOSCOPY;  Service: Gastroenterology;  Laterality: N/A;   hemrrhoid     POLYPECTOMY  03/01/2023   Procedure: POLYPECTOMY;  Surgeon: Therisa Bi, MD;  Location[facility] END[facility];  Service: Gastroenterology;;   TUBAL LIGATION     Family History  Problem Relation Age of Onset   Hypertension Mother    Cancer Father        unknown type   Heart disease Father    Breast cancer Paternal Aunt 12   Hypertension Maternal Grandmother    Stroke Maternal Grandmother    Social History   Occupational History    Employer: [employer]    Comment: caregiver  Tobacco Use   Smoking status: Former    Current packs/day: 0.00    Average packs/day: 0.3 packs/day for 6.0 years (1.5 ttl pk-yrs)    Types:  Cigarettes    Start date: 05/06/1981    Quit date: 05/07/1987    Years since quitting: 36.9   Smokeless tobacco: Never  Vaping Use   Vaping status: Never Used  Substance and Sexual Activity   Alcohol use: Not Currently   Drug use: No   Sexual activity: Yes    Partners: Male    Birth control/protection: Post-menopausal   Tobacco Counseling Counseling given: Yes  SDOH Screenings   Food Insecurity: Food Insecurity Present (04/25/2024)  Housing: Low Risk (04/25/2024)  Transportation Needs: No Transportation Needs (04/25/2024)  Utilities: At Risk (04/25/2024)  Alcohol Screen: Low Risk (02/20/2023)  Depression (PHQ2-9): Low Risk (04/25/2024)  Financial Resource Strain: High Risk (04/24/2024)  Physical Activity: Inactive  (04/25/2024)  Social Connections: Socially Integrated (04/25/2024)  Stress: Stress Concern Present (04/25/2024)  Tobacco Use: Medium Risk (04/25/2024)  Health Literacy: Adequate Health Literacy (04/25/2024)   See flowsheets for full screening details  Depression Screen PHQ 2 & 9 Depression Scale- Over the past 2 weeks, how often have you been bothered by any of the following problems? Little interest or pleasure in doing things: 0 Feeling down, depressed, or hopeless (PHQ Adolescent also includes...irritable): 0 PHQ-2 Total Score: 0 Trouble falling or staying asleep, or sleeping too much: 1 Feeling tired or having little energy: 0 Poor appetite or overeating (PHQ Adolescent also includes...weight loss): 0 Feeling bad about yourself - or that you are a failure or have let yourself or your family down: 0 Trouble concentrating on things, such as reading the newspaper or watching television (PHQ Adolescent also includes...like school work): 0 Moving or speaking so slowly that other people could have noticed. Or the opposite - being so fidgety or restless that you have been moving around a lot more than usual: 0 Thoughts that you would be better off dead, or of hurting yourself in some way: 0 PHQ-9 Total Score: 1 If you checked off any problems, how difficult have these problems made it for you to do your work, take care of things at home, or get along with other people?: Not difficult at all  Depression Treatment Depression Interventions/Treatment : Medication; Currently on Treatment; PHQ2-9 Score <4 Follow-up Not Indicated     Goals Addressed               This Visit's Progress     Patient Stated (pt-stated)        Patient stated she plans to continue watching her sugar in-take             Objective:    Today's Vitals   04/25/24 0841  BP: (!) 142/78  Pulse: 60  SpO2: 98%  Weight: 237 lb 12.8 oz (107.9 kg)  Height: 5' 11 (1.803 m)   Body mass index is 33.17  kg/m.  Hearing/Vision screen Hearing Screening - Comments:: Denies hearing difficulties   Vision Screening - Comments:: Wears rx glasses - up to date with routine eye exams with  Immunizations and Health Maintenance Health Maintenance  Topic Date Due   Diabetic kidney evaluation - Urine ACR  Never done   Zoster Vaccines- Shingrix (1 of 2) 12/04/1973   COVID-19 Vaccine (3 - Moderna risk series) 07/28/2019   DTaP/Tdap/Td (2 - Td or Tdap) 05/13/2022   FOOT EXAM  12/08/2022   Influenza Vaccine  10/19/2023   HEMOGLOBIN A1C  02/29/2024   OPHTHALMOLOGY EXAM  03/04/2024   Diabetic kidney evaluation - eGFR measurement  08/29/2024   Mammogram  04/22/2025   Medicare Annual Wellness (  AWV)  04/25/2025   Colonoscopy  02/28/2033   Pneumococcal Vaccine: 50+ Years  Completed   Bone Density Scan  Completed   Hepatitis C Screening  Completed   Meningococcal B Vaccine  Aged Out        Assessment/Plan:  This is a routine wellness examination for Olivia Kirk.  Patient Care Team: Corwin Antu, FNP as PCP - General (Family Medicine) Hinton Newell SQUIBB, MD as Attending Physician (Family Medicine) Therisa Bi, MD as Consulting Physician (Gastroenterology) Cleotilde Barrio, MD (Orthopedic Surgery)  I have personally reviewed and noted the following in the patients chart:   Medical and social history Use of alcohol, tobacco or illicit drugs  Current medications and supplements including opioid prescriptions. Functional ability and status Nutritional status Physical activity Advanced directives List of other physicians Hospitalizations, surgeries, and ER visits in previous 12 months Vitals Screenings to include cognitive, depression, and falls Referrals and appointments  No orders of the defined types were placed in this encounter.  In addition, I have reviewed and discussed with patient certain preventive protocols, quality metrics, and best practice recommendations. A written personalized care  plan for preventive services as well as general preventive health recommendations were provided to patient.   Olivia Kirk, CMA   04/25/2024   Return in 1 year (on 04/25/2025).  After Visit Summary: (In Person-Declined) Patient declined AVS at this time.  Nurse Notes: [employer] tool used) given for financial assistance - utilities/food; scheduled Diabetes f/u appt w/PCP for 05/2024; scheduled 2027 AWV appt "

## 2024-04-25 NOTE — Patient Instructions (Addendum)
 Olivia Kirk,  Thank you for taking the time for your Medicare Wellness Visit. I appreciate your continued commitment to your health goals. Please review the care plan we discussed, and feel free to reach out if I can assist you further.  Please note that Annual Wellness Visits do not include a physical exam. Some assessments may be limited, especially if the visit was conducted virtually. If needed, we may recommend an in-person follow-up with your provider.  Ongoing Care Seeing your primary care provider every 3 to 6 months helps us  monitor your health and provide consistent, personalized care.   Referrals If a referral was made during today's visit and you haven't received any updates within two weeks, please contact the referred provider directly to check on the status.  Recommended Screenings:  Health Maintenance  Topic Date Due   Kidney health urinalysis for diabetes  Never done   Zoster (Shingles) Vaccine (1 of 2) 12/04/1973   COVID-19 Vaccine (3 - Moderna risk series) 07/28/2019   DTaP/Tdap/Td vaccine (2 - Td or Tdap) 05/13/2022   Complete foot exam   12/08/2022   Flu Shot  10/19/2023   Hemoglobin A1C  02/29/2024   Eye exam for diabetics  03/04/2024   Yearly kidney function blood test for diabetes  08/29/2024   Breast Cancer Screening  04/22/2025   Medicare Annual Wellness Visit  04/25/2025   Colon Cancer Screening  02/28/2033   Pneumococcal Vaccine for age over 63  Completed   Osteoporosis screening with Bone Density Scan  Completed   Hepatitis C Screening  Completed   Meningitis B Vaccine  Aged Out       03/01/2023   10:22 AM  Advanced Directives  Does Patient Have a Medical Advance Directive? No  Copy of Healthcare Power of Attorney in Chart? No - copy requested  Would patient like information on creating a medical advance directive? No - Patient declined    Vision: Annual vision screenings are recommended for early detection of glaucoma, cataracts, and diabetic  retinopathy. These exams can also reveal signs of chronic conditions such as diabetes and high blood pressure.  Dental: Annual dental screenings help detect early signs of oral cancer, gum disease, and other conditions linked to overall health, including heart disease and diabetes.

## 2024-04-29 ENCOUNTER — Ambulatory Visit: Admitting: Family

## 2025-05-01 ENCOUNTER — Encounter: Admitting: Family

## 2025-05-01 ENCOUNTER — Ambulatory Visit
